# Patient Record
Sex: Male | Born: 1980 | Race: Black or African American | Hispanic: No | Marital: Single | Smoking: Current every day smoker
Health system: Southern US, Community
[De-identification: ages and names within clinical notes are randomized; demographics above are authoritative.]

## PROBLEM LIST (undated history)

## (undated) DIAGNOSIS — I10 Essential (primary) hypertension: Secondary | ICD-10-CM

## (undated) DIAGNOSIS — E7800 Pure hypercholesterolemia, unspecified: Secondary | ICD-10-CM

## (undated) DIAGNOSIS — G8929 Other chronic pain: Secondary | ICD-10-CM

## (undated) DIAGNOSIS — D693 Immune thrombocytopenic purpura: Secondary | ICD-10-CM

## (undated) DIAGNOSIS — M5380 Other specified dorsopathies, site unspecified: Secondary | ICD-10-CM

## (undated) HISTORY — PX: LEG SURGERY: SHX1003

## (undated) HISTORY — DX: Other chronic pain: G89.29

## (undated) HISTORY — PX: BACK SURGERY: SHX140

---
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
## 2000-03-28 ENCOUNTER — Emergency Department (HOSPITAL_COMMUNITY): Admission: EM | Admit: 2000-03-28 | Discharge: 2000-03-28 | Payer: Self-pay | Admitting: Emergency Medicine

## 2000-05-06 ENCOUNTER — Emergency Department (HOSPITAL_COMMUNITY): Admission: EM | Admit: 2000-05-06 | Discharge: 2000-05-06 | Payer: Self-pay | Admitting: Emergency Medicine

## 2001-10-23 ENCOUNTER — Emergency Department (HOSPITAL_COMMUNITY): Admission: EM | Admit: 2001-10-23 | Discharge: 2001-10-24 | Payer: Self-pay | Admitting: Emergency Medicine

## 2007-01-25 ENCOUNTER — Emergency Department (HOSPITAL_COMMUNITY): Admission: EM | Admit: 2007-01-25 | Discharge: 2007-01-26 | Payer: Self-pay | Admitting: Emergency Medicine

## 2007-03-08 ENCOUNTER — Emergency Department (HOSPITAL_COMMUNITY): Admission: EM | Admit: 2007-03-08 | Discharge: 2007-03-08 | Payer: Self-pay | Admitting: Emergency Medicine

## 2007-12-22 ENCOUNTER — Emergency Department (HOSPITAL_COMMUNITY): Admission: EM | Admit: 2007-12-22 | Discharge: 2007-12-22 | Payer: Self-pay | Admitting: Emergency Medicine

## 2008-10-22 ENCOUNTER — Emergency Department (HOSPITAL_COMMUNITY): Admission: EM | Admit: 2008-10-22 | Discharge: 2008-10-22 | Payer: Self-pay | Admitting: Family Medicine

## 2010-12-15 ENCOUNTER — Emergency Department (HOSPITAL_COMMUNITY)
Admission: EM | Admit: 2010-12-15 | Discharge: 2010-12-15 | Payer: Self-pay | Source: Home / Self Care | Admitting: Emergency Medicine

## 2011-01-09 ENCOUNTER — Emergency Department (HOSPITAL_COMMUNITY)
Admission: EM | Admit: 2011-01-09 | Discharge: 2011-01-09 | Payer: Self-pay | Source: Home / Self Care | Admitting: Family Medicine

## 2011-01-13 LAB — GLUCOSE, CAPILLARY: Glucose-Capillary: 98 mg/dL (ref 70–99)

## 2011-01-17 ENCOUNTER — Emergency Department (HOSPITAL_COMMUNITY)
Admission: EM | Admit: 2011-01-17 | Discharge: 2011-01-17 | Payer: Self-pay | Source: Home / Self Care | Admitting: Emergency Medicine

## 2012-01-19 ENCOUNTER — Emergency Department (INDEPENDENT_AMBULATORY_CARE_PROVIDER_SITE_OTHER)
Admission: EM | Admit: 2012-01-19 | Discharge: 2012-01-19 | Disposition: A | Discharge status: Home / Self Care | Payer: Self-pay | Source: Home / Self Care | Attending: Emergency Medicine | Admitting: Emergency Medicine

## 2012-01-19 ENCOUNTER — Encounter (HOSPITAL_COMMUNITY): Payer: Self-pay

## 2012-01-19 DIAGNOSIS — S39012A Strain of muscle, fascia and tendon of lower back, initial encounter: Secondary | ICD-10-CM

## 2012-01-19 DIAGNOSIS — S335XXA Sprain of ligaments of lumbar spine, initial encounter: Secondary | ICD-10-CM

## 2012-01-19 DIAGNOSIS — M549 Dorsalgia, unspecified: Secondary | ICD-10-CM

## 2012-01-19 HISTORY — DX: Other specified dorsopathies, site unspecified: M53.80

## 2012-01-19 MED ORDER — MELOXICAM 7.5 MG PO TABS: 7.5000 mg | ORAL_TABLET | Freq: Every day | ORAL | Status: DC

## 2012-01-19 MED ORDER — CYCLOBENZAPRINE HCL 10 MG PO TABS: 10.0000 mg | ORAL_TABLET | Freq: Two times a day (BID) | ORAL | Status: AC | PRN
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
## 2013-01-27 ENCOUNTER — Emergency Department (HOSPITAL_COMMUNITY)
Admission: EM | Admit: 2013-01-27 | Discharge: 2013-01-27 | Disposition: A | Discharge status: Home / Self Care | Payer: Self-pay | Attending: Emergency Medicine | Admitting: Emergency Medicine

## 2013-01-27 ENCOUNTER — Encounter (HOSPITAL_COMMUNITY): Payer: Self-pay | Admitting: Emergency Medicine

## 2013-01-27 ENCOUNTER — Emergency Department (HOSPITAL_COMMUNITY): Payer: Self-pay

## 2013-01-27 DIAGNOSIS — F17200 Nicotine dependence, unspecified, uncomplicated: Secondary | ICD-10-CM | POA: Insufficient documentation

## 2013-01-27 DIAGNOSIS — Y9389 Activity, other specified: Secondary | ICD-10-CM | POA: Insufficient documentation

## 2013-01-27 DIAGNOSIS — IMO0002 Reserved for concepts with insufficient information to code with codable children: Secondary | ICD-10-CM | POA: Insufficient documentation

## 2013-01-27 DIAGNOSIS — X500XXA Overexertion from strenuous movement or load, initial encounter: Secondary | ICD-10-CM | POA: Insufficient documentation

## 2013-01-27 DIAGNOSIS — M549 Dorsalgia, unspecified: Secondary | ICD-10-CM

## 2013-01-27 DIAGNOSIS — Z9889 Other specified postprocedural states: Secondary | ICD-10-CM | POA: Insufficient documentation

## 2013-01-27 DIAGNOSIS — Y9269 Other specified industrial and construction area as the place of occurrence of the external cause: Secondary | ICD-10-CM | POA: Insufficient documentation

## 2013-01-27 DIAGNOSIS — Y990 Civilian activity done for income or pay: Secondary | ICD-10-CM | POA: Insufficient documentation

## 2013-01-27 MED ORDER — OXYCODONE-ACETAMINOPHEN 5-325 MG PO TABS
1.0000 | ORAL_TABLET | Freq: Once | ORAL | Status: AC
  Administered 2013-01-27: 1 via ORAL
  Filled 2013-01-27: qty 1

## 2013-01-27 MED ORDER — DIAZEPAM 5 MG PO TABS
5.0000 mg | ORAL_TABLET | Freq: Once | ORAL | Status: AC
  Administered 2013-01-27: 5 mg via ORAL
  Filled 2013-01-27: qty 1

## 2013-01-27 MED ORDER — KETOROLAC TROMETHAMINE 60 MG/2ML IM SOLN
60.0000 mg | Freq: Once | INTRAMUSCULAR | Status: AC
  Administered 2013-01-27: 60 mg via INTRAMUSCULAR
  Filled 2013-01-27: qty 2

## 2013-01-27 MED ORDER — HYDROCODONE-ACETAMINOPHEN 5-500 MG PO TABS: 1.0000 | ORAL_TABLET | Freq: Four times a day (QID) | ORAL | Status: DC | PRN

## 2013-01-27 MED ORDER — CYCLOBENZAPRINE HCL 10 MG PO TABS: 10.0000 mg | ORAL_TABLET | Freq: Two times a day (BID) | ORAL | Status: DC | PRN

## 2013-01-27 MED ORDER — IBUPROFEN 600 MG PO TABS: 600.0000 mg | ORAL_TABLET | Freq: Four times a day (QID) | ORAL | Status: DC | PRN

## 2013-01-27 NOTE — ED Notes (Signed)
Pt c/o back pain since lifting this am. Has had recent back surgery per Dr. Danielle Dess (2012) for "2 slipped discs". Has had intermittent pain since then and numbness in left foot. Pain radiates from lower back to groin and inner thighs.

## 2013-01-27 NOTE — ED Notes (Signed)
Returned from xray

## 2013-01-27 NOTE — ED Provider Notes (Signed)
History     CSN: [id number]  Arrival date & time 01/27/13  1026   First MD Initiated Contact with Patient 01/27/13 1027      No chief complaint on file.   (Consider location/radiation/quality/duration/timing/severity/associated sxs/prior treatment) HPI Jeremiah Curtis is a 32 y.o. male who presents with complaint of lower back pain. Stats hx of surgery a year ago by Dr. Danielle Dess? States had two bad disks. Reports intermittent pain and left foot numbness since then. Has not followed up with Dr. Danielle Dess in the last year. States this morning was at work, lifted a 25lb box, when felt increased pain in the lower back. States pain shooting down left leg. Reports some numbness sensation in left foot. No weakness. No loss of bowels. No urinary incontinence or retention. No fever.   Past Medical History  Diagnosis Date  . Back symptoms, other     Past Surgical History  Procedure Date  . Back surgery     No family history on file.  History  Substance Use Topics  . Smoking status: Current Every Day Smoker  . Smokeless tobacco: Not on file  . Alcohol Use: Yes      Review of Systems  Constitutional: Negative for fever and chills.  Respiratory: Negative.   Cardiovascular: Negative.   Gastrointestinal: Negative for abdominal pain.  Genitourinary: Negative for dysuria, flank pain and difficulty urinating.  Musculoskeletal: Positive for back pain.    Allergies  Review of patient's allergies indicates no known allergies.  Home Medications   Current Outpatient Rx  Name  Route  Sig  Dispense  Refill  . MELOXICAM 7.5 MG PO TABS   Oral   Take 1 tablet (7.5 mg total) by mouth daily.   14 tablet   0     BP 155/93  Pulse 69  Temp 97.7 F (36.5 C) (Oral)  Resp 18  SpO2 100%  Physical Exam  Nursing note and vitals reviewed. Constitutional: He appears well-developed and well-nourished. No distress.  Neck: Neck supple.  Cardiovascular: Normal rate, regular rhythm and normal  heart sounds.   Pulmonary/Chest: Effort normal and breath sounds normal. No respiratory distress. He has no wheezes. He has no rales.  Musculoskeletal:       Midline lumbar spine tenderness. Paravertebral tenderness. Pain with left straight leg raise  Neurological:       Decreased sensation to dorsum of left foot. 5/5 and equal strength with bilateral foot dorsiflexion and plantarflexion. Normal sensation over bilateral thighs and perineum  Skin: Skin is warm and dry.    ED Course  Procedures (including critical care time)  Pt with back pain after lifting a 25lb box today. Hx of back surgery a year ago. No new numbness or weakness. Will get x-ray.  Dg Lumbar Spine Complete  01/27/2013  *RADIOLOGY REPORT*  Clinical Data: Pain post trauma  LUMBAR SPINE - COMPLETE 4+ VIEW  Comparison: None.  Findings:  Frontal, lateral, spot lumbosacral lateral, and bilateral oblique views were obtained. There are five non-rib bearing lumbar type vertebral bodies.  There is no fracture or spondylolisthesis.  There is mild disc space narrowing at L4-5. The other disc spaces appear normal.  There is no appreciable facet arthropathy.  IMPRESSION: Mild narrowing L4-5.  No fracture or spondylolisthesis.   Original Report Authenticated By: Bretta Bang, M.D.       1. Back pain       MDM  Pt with lower back pain radiating into left leg after lifting a box. States  pain has been like this before, no new radiating pain, no new numbness or weakness. No red flags suggesting cauda equina. No emergent indication for MRI. Pt ambulatory. Will treat at home with ibuprofen, vicodin, flexeril. Follow up with his surgeon.   Filed Vitals:   01/27/13 1040  BP: 155/93  Pulse: 69  Temp: 97.7 F (36.5 C)  Resp: 18           Keyron Pokorski A Akshath Mccarey, PA 01/27/13 1214

## 2013-01-27 NOTE — ED Notes (Signed)
Patient transported to X-ray 

## 2013-01-27 NOTE — ED Provider Notes (Signed)
Medical screening examination/treatment/procedure(s) were performed by non-physician practitioner and as supervising physician I was immediately available for consultation/collaboration.   Loren Racer, MD 01/27/13 1556

## 2013-02-02 ENCOUNTER — Encounter (HOSPITAL_COMMUNITY): Payer: Self-pay

## 2013-02-02 ENCOUNTER — Emergency Department (HOSPITAL_COMMUNITY)
Admission: EM | Admit: 2013-02-02 | Discharge: 2013-02-02 | Disposition: A | Discharge status: Home / Self Care | Payer: Self-pay | Attending: Emergency Medicine | Admitting: Emergency Medicine

## 2013-02-02 DIAGNOSIS — F17200 Nicotine dependence, unspecified, uncomplicated: Secondary | ICD-10-CM | POA: Insufficient documentation

## 2013-02-02 DIAGNOSIS — Z9889 Other specified postprocedural states: Secondary | ICD-10-CM | POA: Insufficient documentation

## 2013-02-02 DIAGNOSIS — M549 Dorsalgia, unspecified: Secondary | ICD-10-CM

## 2013-02-02 DIAGNOSIS — Z79899 Other long term (current) drug therapy: Secondary | ICD-10-CM | POA: Insufficient documentation

## 2013-02-02 DIAGNOSIS — M545 Low back pain, unspecified: Secondary | ICD-10-CM | POA: Insufficient documentation

## 2013-02-02 MED ORDER — OXYCODONE-ACETAMINOPHEN 5-325 MG PO TABS: 2.0000 | ORAL_TABLET | ORAL | Status: DC | PRN

## 2013-02-02 MED ORDER — METHOCARBAMOL 500 MG PO TABS: 500.0000 mg | ORAL_TABLET | Freq: Two times a day (BID) | ORAL | Status: DC | PRN

## 2013-02-02 MED ORDER — KETOROLAC TROMETHAMINE 60 MG/2ML IM SOLN
60.0000 mg | Freq: Once | INTRAMUSCULAR | Status: AC
  Administered 2013-02-02: 60 mg via INTRAMUSCULAR
  Filled 2013-02-02: qty 2

## 2013-02-02 MED ORDER — DIAZEPAM 5 MG PO TABS
5.0000 mg | ORAL_TABLET | Freq: Once | ORAL | Status: AC
  Administered 2013-02-02: 5 mg via ORAL
  Filled 2013-02-02: qty 1

## 2013-02-02 NOTE — ED Provider Notes (Signed)
History     CSN: [id number]  Arrival date & time 02/02/13  4098   First MD Initiated Contact with Patient 02/02/13 [phone]      Chief Complaint  Patient presents with  . Back Pain    (Consider location/radiation/quality/duration/timing/severity/associated sxs/prior treatment) HPI Comments: Patient is a 32 year old male who presents with gradual onset of lower back pain that started 1 week ago. The pain is sharp and severe and radiates into his bilateral legs. The pain is constant. Movement and defecation makes the pain worse. Nothing makes the pain better. Patient has tried flexeril and vicodin for pain without relief. No associated symptoms. No saddles paresthesias or bladder/bowel incontinence.      Past Medical History  Diagnosis Date  . Back symptoms, other     Past Surgical History  Procedure Date  . Back surgery     No family history on file.  History  Substance Use Topics  . Smoking status: Current Every Day Smoker  . Smokeless tobacco: Not on file  . Alcohol Use: Yes      Review of Systems  Musculoskeletal: Positive for back pain.  All other systems reviewed and are negative.    Allergies  Fish allergy  Home Medications   Current Outpatient Rx  Name  Route  Sig  Dispense  Refill  . CYCLOBENZAPRINE HCL 10 MG PO TABS   Oral   Take 1 tablet (10 mg total) by mouth 2 (two) times daily as needed for muscle spasms.   20 tablet   0   . HYDROCODONE-ACETAMINOPHEN 5-500 MG PO TABS   Oral   Take 1-2 tablets by mouth every 6 (six) hours as needed for pain.   15 tablet   0   . IBUPROFEN 600 MG PO TABS   Oral   Take 1 tablet (600 mg total) by mouth every 6 (six) hours as needed for pain.   30 tablet   0     BP 167/101  Pulse 87  Temp 98.4 F (36.9 C) (Oral)  Resp 20  Ht 5\' 5"  (1.651 m)  Wt 160 lb (72.576 kg)  BMI 26.63 kg/m2  SpO2 100%  Physical Exam  Nursing note and vitals reviewed. Constitutional: He is oriented to person, place, and time.  He appears well-developed and well-nourished. No distress.  HENT:  Head: Normocephalic and atraumatic.  Eyes: Conjunctivae normal and EOM are normal.  Neck: Normal range of motion. Neck supple.  Cardiovascular: Normal rate and regular rhythm.  Exam reveals no gallop and no friction rub.   No murmur heard. Pulmonary/Chest: Effort normal and breath sounds normal. He has no wheezes. He has no rales. He exhibits no tenderness.  Abdominal: Soft. He exhibits no distension. There is no tenderness. There is no rebound and no guarding.  Musculoskeletal: Normal range of motion.       No midline tenderness to palpation.   Neurological: He is alert and oriented to person, place, and time. Coordination normal.       Strength and sensation equal and intact bilaterally. Speech is goal-oriented. Moves limbs without ataxia.   Skin: Skin is warm and dry.  Psychiatric: He has a normal mood and affect. His behavior is normal.    ED Course  Procedures (including critical care time)  Labs Reviewed - No data to display No results found.   1. Back pain       MDM  9:19 AM Patient will have toradol and valium for pain. Xray done  when he was here last week. No new injury so repeat xray not indicated. No neuro deficits. No bladder/bowel incontinence, saddle paresthesias. No fever/weight loss. Patient will be instructed to follow up with Dr. Danielle Dess for further evaluation and management. Patient instructed to return with worsening or concerning symptoms.         Emilia Beck, [place] 02/02/13 [phone]

## 2013-02-02 NOTE — ED Notes (Signed)
Lower back pain began to change on Sunday was seen here last Thursday for the same .  The new symptoms are numbness to the lt. Leg and spasm in the right, Denies any problems with voiding severe pain when moving his bowels.

## 2013-02-02 NOTE — ED Provider Notes (Signed)
Medical screening examination/treatment/procedure(s) were performed by non-physician practitioner and as supervising physician I was immediately available for consultation/collaboration.  Evanne Matsunaga, MD 02/02/13 2315 

## 2013-06-04 ENCOUNTER — Encounter (HOSPITAL_COMMUNITY): Payer: Self-pay | Admitting: Emergency Medicine

## 2013-06-04 ENCOUNTER — Emergency Department (HOSPITAL_COMMUNITY)
Admission: EM | Admit: 2013-06-04 | Discharge: 2013-06-04 | Disposition: A | Discharge status: Home / Self Care | Payer: Self-pay | Attending: Emergency Medicine | Admitting: Emergency Medicine

## 2013-06-04 DIAGNOSIS — M62830 Muscle spasm of back: Secondary | ICD-10-CM

## 2013-06-04 DIAGNOSIS — F17200 Nicotine dependence, unspecified, uncomplicated: Secondary | ICD-10-CM | POA: Insufficient documentation

## 2013-06-04 DIAGNOSIS — M62838 Other muscle spasm: Secondary | ICD-10-CM | POA: Insufficient documentation

## 2013-06-04 MED ORDER — CYCLOBENZAPRINE HCL 10 MG PO TABS: 10.0000 mg | ORAL_TABLET | Freq: Two times a day (BID) | ORAL | Status: DC | PRN

## 2013-06-04 MED ORDER — CYCLOBENZAPRINE HCL 10 MG PO TABS
10.0000 mg | ORAL_TABLET | Freq: Once | ORAL | Status: AC
  Administered 2013-06-04: 10 mg via ORAL
  Filled 2013-06-04: qty 1

## 2013-06-04 NOTE — ED Notes (Addendum)
Hx of 2 ruptured discs in 2011; surgical repair; numbness in left leg since. Today presents with lower back pressure worsening by working around house since Thursday; rest and Ramond Dial helps it. Patient ambulates with steady gait; no distress with movement at this time.

## 2013-06-04 NOTE — ED Notes (Signed)
PT ambulated with baseline gait; VSS; A&Ox3; no signs of distress; respirations even and unlabored; skin warm and dry; no questions upon discharge.  

## 2013-06-04 NOTE — ED Provider Notes (Signed)
History     CSN: [id number]  Arrival date & time 06/04/13  0840   First MD Initiated Contact with Patient 06/04/13 [phone]      Chief Complaint  Patient presents with  . Back Pain    (Consider location/radiation/quality/duration/timing/severity/associated sxs/prior treatment) Patient is a 32 y.o. male presenting with back pain. The history is provided by the patient.  Back Pain Location:  Lumbar spine Quality:  Aching and stiffness Stiffness is present:  Unable to specify Radiates to:  Does not radiate Pain severity:  Mild Pain is:  Same all the time Onset quality:  Sudden Duration:  2 days Timing:  Constant Progression:  Unchanged Chronicity:  New Context: lifting heavy objects   Relieved by:  Nothing Worsened by:  Nothing tried Associated symptoms: no abdominal pain, no dysuria and no fever     Past Medical History  Diagnosis Date  . Back symptoms, other     Past Surgical History  Procedure Laterality Date  . Back surgery      History reviewed. No pertinent family history.  History  Substance Use Topics  . Smoking status: Current Every Day Smoker  . Smokeless tobacco: Not on file  . Alcohol Use: Yes      Review of Systems  Constitutional: Negative for fever and fatigue.  Gastrointestinal: Negative for nausea, vomiting and abdominal pain.  Genitourinary: Negative for dysuria and difficulty urinating.  Musculoskeletal: Positive for back pain.  All other systems reviewed and are negative.    Allergies  Fish allergy  Home Medications  No current outpatient prescriptions on file.  BP 148/90  Pulse 76  Temp(Src) 98.3 F (36.8 C) (Oral)  Resp 16  Ht 5\' 5"  (1.651 m)  Wt 180 lb (81.647 kg)  BMI 29.95 kg/m2  SpO2 100%  Physical Exam  Constitutional: He is oriented to person, place, and time. He appears well-developed and well-nourished. No distress.  HENT:  Head: Normocephalic and atraumatic.  Mouth/Throat: No oropharyngeal exudate.  Eyes: EOM  are normal. Pupils are equal, round, and reactive to light.  Neck: Normal range of motion. Neck supple.  Cardiovascular: Normal rate and regular rhythm.  Exam reveals no friction rub.   No murmur heard. Pulmonary/Chest: Effort normal and breath sounds normal. No respiratory distress. He has no wheezes. He has no rales.  Abdominal: He exhibits no distension. There is no tenderness. There is no rebound.  Musculoskeletal: He exhibits no edema.       Lumbar back: He exhibits decreased range of motion, tenderness (mild, left lower back) and spasm. He exhibits no bony tenderness, no swelling, no edema, no deformity and no laceration.       Back:  Neurological: He is alert and oriented to person, place, and time.  Skin: He is not diaphoretic.    ED Course  Procedures (including critical care time)  Labs Reviewed - No data to display No results found.   1. Back muscle spasm       MDM    32 year old male presents with L lower back pain after lifting boxes 2 days ago. States he feels spasm in his back. Denies weakness/numbness, incontinence, urinary/fecal retention. No cancer hx, no hx of IVDA. AFVSS here. Mild L lower back spasm, no midline tenderness. Normal strength and sensation, reflexes 1+. Will give flexeril and f/u with PCP.         Dagmar Hait, MD 06/04/13 1550

## 2013-06-04 NOTE — ED Notes (Signed)
Pt presents to ED today with complaints of low back pressure across both sides . Pt states he had a steroid shot about 2 months ago and he thinks the back pain is coming from injection site.

## 2013-06-05 NOTE — ED Provider Notes (Signed)
I saw and evaluated the patient, reviewed the resident's note and I agree with the findings and plan.   Loren Racer, MD 06/05/13 1500

## 2013-11-03 ENCOUNTER — Encounter (HOSPITAL_COMMUNITY): Payer: Self-pay | Admitting: Emergency Medicine

## 2013-11-03 ENCOUNTER — Emergency Department (HOSPITAL_COMMUNITY)
Admission: EM | Admit: 2013-11-03 | Discharge: 2013-11-03 | Disposition: A | Discharge status: Home / Self Care | Payer: Self-pay | Attending: Emergency Medicine | Admitting: Emergency Medicine

## 2013-11-03 DIAGNOSIS — R209 Unspecified disturbances of skin sensation: Secondary | ICD-10-CM | POA: Insufficient documentation

## 2013-11-03 DIAGNOSIS — R599 Enlarged lymph nodes, unspecified: Secondary | ICD-10-CM | POA: Insufficient documentation

## 2013-11-03 DIAGNOSIS — H9209 Otalgia, unspecified ear: Secondary | ICD-10-CM | POA: Insufficient documentation

## 2013-11-03 DIAGNOSIS — R202 Paresthesia of skin: Secondary | ICD-10-CM

## 2013-11-03 DIAGNOSIS — R590 Localized enlarged lymph nodes: Secondary | ICD-10-CM

## 2013-11-03 DIAGNOSIS — F17200 Nicotine dependence, unspecified, uncomplicated: Secondary | ICD-10-CM | POA: Insufficient documentation

## 2013-11-03 MED ORDER — IBUPROFEN 800 MG PO TABS: 800.0000 mg | ORAL_TABLET | Freq: Three times a day (TID) | ORAL | Status: DC

## 2013-11-03 NOTE — ED Notes (Signed)
The pt has pain in the back of his head since yesterday and tonight he noticed a lump behind his lt ear with numbness.  Moves all extremities no distress.

## 2013-11-03 NOTE — ED Notes (Signed)
Pt in c/o pain to the back of his neck, denies injury, states the pain, also pain to left ear, pain started yesterday

## 2013-11-03 NOTE — ED Notes (Signed)
No answer in waitiing room

## 2013-11-03 NOTE — ED Provider Notes (Signed)
CSN: [id number]     Arrival date & time 11/03/13  1927 History  This chart was scribed for non-physician practitioner Dierdre Forth, PA-C working with Shon Baton, MD by Clydene Laming, ED Scribe. This patient was seen in room TR10C/TR10C and the patient's care was started at 9:45 PM.   Chief Complaint  Patient presents with  . Neck Pain  . Otalgia    The history is provided by the patient and medical records. No language interpreter was used.   HPI Comments: Jeremiah Curtis is a 32 y.o. male who presents to the Emergency Department complaining of lumps on his neck onset yesterday with paresthesia of the scalp onset today. Pt reports he thinks that he may have a cold as his nose has been running and he has felt tired. Pt denies h/a, vision changes, fevers and chills, neck pain, stiffness, chest pain, cough, shortness of breath, abdominal pain, nausea, vomiting, diarrhea, weakness, dizziness, syncope for dysuria, hematuria, weakness, numbness.  He is not having trouble walking or talking. Pt denies having a scalp infection or any additional abnormalities. He states he has not had these symptoms previously. Pt has a hx of back surgery 3 years ago. Pt has not attempted any treatments for relief.  Nothing makes it better or worse.  Pt states he had feel the pressure of palpation, but he cannot feel pain when he pulls his hair.    Past Medical History  Diagnosis Date  . Back symptoms, other    Past Surgical History  Procedure Laterality Date  . Back surgery     History reviewed. No pertinent family history. History  Substance Use Topics  . Smoking status: Current Every Day Smoker  . Smokeless tobacco: Not on file  . Alcohol Use: Yes    Review of Systems  Constitutional: Negative for fever, chills, diaphoresis, appetite change, fatigue and unexpected weight change.  HENT: Negative for ear pain and mouth sores.   Eyes: Negative for visual disturbance.  Respiratory: Negative for  cough, chest tightness, shortness of breath and wheezing.   Cardiovascular: Negative for chest pain.  Gastrointestinal: Negative for nausea, vomiting, abdominal pain, diarrhea and constipation.  Endocrine: Negative for polydipsia, polyphagia and polyuria.  Genitourinary: Negative for dysuria, urgency, frequency and hematuria.  Musculoskeletal: Negative for back pain, neck pain and neck stiffness.  Skin: Negative for rash.  Allergic/Immunologic: Negative for immunocompromised state.  Neurological: Positive for numbness. Negative for syncope, light-headedness and headaches.  Hematological: Positive for adenopathy. Does not bruise/bleed easily.  Psychiatric/Behavioral: Negative for sleep disturbance. The patient is not nervous/anxious.     Allergies  Fish allergy  Home Medications   Current Outpatient Rx  Name  Route  Sig  Dispense  Refill  . ibuprofen (ADVIL,MOTRIN) 800 MG tablet   Oral   Take 1 tablet (800 mg total) by mouth 3 (three) times daily.   21 tablet   0    Triage Vitals:BP 146/96  Pulse 91  Temp(Src) 97.7 F (36.5 C) (Oral)  Resp 18  Wt 173 lb 12.8 oz (78.835 kg)  SpO2 98% Physical Exam  Nursing note and vitals reviewed. Constitutional: He is oriented to person, place, and time. He appears well-developed and well-nourished. No distress.  HENT:  Head: Normocephalic and atraumatic.    Right Ear: Hearing, tympanic membrane, external ear and ear canal normal.  Left Ear: Hearing, tympanic membrane, external ear and ear canal normal.  Nose: Nose normal. No mucosal edema.  Mouth/Throat: Uvula is midline, oropharynx is clear  and moist and mucous membranes are normal. No oropharyngeal exudate, posterior oropharyngeal edema, posterior oropharyngeal erythema or tonsillar abscesses.  Eyes: Conjunctivae and EOM are normal. Pupils are equal, round, and reactive to light. No scleral icterus.  Neck: Normal range of motion and full passive range of motion without pain. Neck  supple. No spinous process tenderness and no muscular tenderness present. No rigidity. Normal range of motion present. No Brudzinski's sign and no Kernig's sign noted.  Full range of motion without pain No nuchal rigidity No meningeal signs  Cardiovascular: Normal rate, regular rhythm, normal heart sounds and intact distal pulses.   No murmur heard. Pulmonary/Chest: Effort normal and breath sounds normal. No respiratory distress. He has no wheezes. He has no rales.  Abdominal: Soft. Bowel sounds are normal. He exhibits no distension. There is no tenderness. There is no rebound and no guarding.  Musculoskeletal: Normal range of motion.  Lymphadenopathy:       Head (right side): Occipital adenopathy present. No submental, no submandibular, no tonsillar, no preauricular and no posterior auricular adenopathy present.       Head (left side): Occipital adenopathy present. No submental, no submandibular, no tonsillar, no preauricular and no posterior auricular adenopathy present.    He has no cervical adenopathy.       Right cervical: No superficial cervical, no deep cervical and no posterior cervical adenopathy present.      Left cervical: No superficial cervical, no deep cervical and no posterior cervical adenopathy present.    He has no axillary adenopathy.       Right axillary: No pectoral and no lateral adenopathy present.       Left axillary: No pectoral and no lateral adenopathy present.      Right: No supraclavicular adenopathy present.       Left: No supraclavicular adenopathy present.  Neurological: He is alert and oriented to person, place, and time. He has normal reflexes. No cranial nerve deficit. He exhibits normal muscle tone. Coordination normal. GCS eye subscore is 4. GCS verbal subscore is 5. GCS motor subscore is 6.  Reflex Scores:      Tricep reflexes are 2+ on the right side and 2+ on the left side.      Bicep reflexes are 2+ on the right side and 2+ on the left side.       Brachioradialis reflexes are 2+ on the right side and 2+ on the left side.      Patellar reflexes are 2+ on the right side and 2+ on the left side.      Achilles reflexes are 2+ on the right side and 2+ on the left side. Speech is clear and goal oriented, follows commands Cranial nerves III - XII without deficit, no facial droop Normal strength in upper and lower extremities bilaterally, strong and equal grip strength Sensation normal to light and sharp touch in the bilateral upper and lower extremities - patient reports decreased sensation to approximately quarter sized area just above the left ear Moves extremities without ataxia, coordination intact Normal finger to nose and rapid alternating movements Neg romberg, no pronator drift Normal gait Normal heel-shin and balance   Skin: Skin is warm and dry. No rash noted. He is not diaphoretic. No erythema.  Psychiatric: He has a normal mood and affect. His behavior is normal. Judgment and thought content normal.    ED Course  Procedures (including critical care time) DIAGNOSTIC STUDIES: Oxygen Saturation is 100% on RA, normal by my interpretation.  COORDINATION OF CARE: 09:50 PM- Discussed treatment plan with pt at bedside. Pt verbalized understanding and agreement with plan.   Labs Review Labs Reviewed - No data to display Imaging Review No results found.  EKG Interpretation   None       MDM   1. Paresthesia   2. Lymphadenopathy, occipital      Shannon Kirkendall presents with lymphadenopathy and approximately quarter-sized area just above the left ear with decreased sensation in the C2 dermatome however paresthesia does not extend throughout the entire C2 dermatome and is present only on the left. Patient is otherwise neurovascularly intact with otherwise normal neurologic exam.  Mild occipital lymphadenopathy but no other lymphadenopathy noted on patient exam.  No changes to the skin or hair of the scalp. No evidence of  otitis media or otitis externa on exam. No evidence of cellulitis.  Patient to be discharged with anti-inflammatories and followup with primary care.  Is initially hypertensive when he arrived however blood pressure has decreased spontaneously.  He is alert, oriented, nontoxic, nonseptic appearing. He is afebrile and not tachycardic.  He has no paraspinal or midline neck tenderness and no nuchal rigidity.  It has been determined that no acute conditions requiring further emergency intervention are present at this time. The patient/guardian have been advised of the diagnosis and plan. We have discussed signs and symptoms that warrant return to the ED, such as changes or worsening in symptoms.   Vital signs are stable at discharge.   BP 146/96  Pulse 91  Temp(Src) 97.7 F (36.5 C) (Oral)  Resp 18  Wt 173 lb 12.8 oz (78.835 kg)  SpO2 98%  Patient/guardian has voiced understanding and agreed to follow-up with the PCP or specialist.   I personally performed the services described in this documentation, which was scribed in my presence. The recorded information has been reviewed and is accurate.     Dahlia Client Lutie Pickler, PA-C 11/03/13 2321

## 2013-11-04 NOTE — ED Provider Notes (Signed)
Medical screening examination/treatment/procedure(s) were performed by non-physician practitioner and as supervising physician I was immediately available for consultation/collaboration.  EKG Interpretation   None        Graziella Connery F Frimet Durfee, MD 11/04/13 1438 

## 2014-01-24 ENCOUNTER — Encounter (HOSPITAL_COMMUNITY): Payer: Self-pay | Admitting: Emergency Medicine

## 2014-01-24 ENCOUNTER — Emergency Department (HOSPITAL_COMMUNITY)
Admission: EM | Admit: 2014-01-24 | Discharge: 2014-01-24 | Disposition: A | Discharge status: Home / Self Care | Payer: Self-pay | Attending: Emergency Medicine | Admitting: Emergency Medicine

## 2014-01-24 DIAGNOSIS — M545 Low back pain, unspecified: Secondary | ICD-10-CM | POA: Insufficient documentation

## 2014-01-24 DIAGNOSIS — M549 Dorsalgia, unspecified: Secondary | ICD-10-CM

## 2014-01-24 DIAGNOSIS — Z791 Long term (current) use of non-steroidal anti-inflammatories (NSAID): Secondary | ICD-10-CM | POA: Insufficient documentation

## 2014-01-24 DIAGNOSIS — Z9889 Other specified postprocedural states: Secondary | ICD-10-CM | POA: Insufficient documentation

## 2014-01-24 DIAGNOSIS — F17200 Nicotine dependence, unspecified, uncomplicated: Secondary | ICD-10-CM | POA: Insufficient documentation

## 2014-01-24 HISTORY — DX: Essential (primary) hypertension: I10

## 2014-01-24 MED ORDER — KETOROLAC TROMETHAMINE 60 MG/2ML IM SOLN
60.0000 mg | Freq: Once | INTRAMUSCULAR | Status: AC
  Administered 2014-01-24: 60 mg via INTRAMUSCULAR
  Filled 2014-01-24: qty 2

## 2014-01-24 MED ORDER — NAPROXEN 500 MG PO TABS: 500.0000 mg | ORAL_TABLET | Freq: Two times a day (BID) | ORAL | Status: DC

## 2014-01-24 MED ORDER — CYCLOBENZAPRINE HCL 10 MG PO TABS: 10.0000 mg | ORAL_TABLET | Freq: Two times a day (BID) | ORAL | Status: DC | PRN

## 2014-01-24 MED ORDER — CYCLOBENZAPRINE HCL 10 MG PO TABS
10.0000 mg | ORAL_TABLET | Freq: Once | ORAL | Status: AC
Start: 2014-01-24 — End: 2014-01-24
  Administered 2014-01-24: 10 mg via ORAL
  Filled 2014-01-24: qty 1

## 2014-01-24 NOTE — ED Notes (Signed)
Szekalski, PA at bedside for evaluation.  

## 2014-01-24 NOTE — ED Provider Notes (Signed)
CSN: [id number]     Arrival date & time 01/24/14  0911 History   This chart was scribed for non-physician practitioner Emilia BeckKaitlyn Turkessa Ostrom, PA-C, working with Shon Batonourtney F Horton, MD, by Yevette EdwardsAngela Bracken, ED Scribe. This patient was seen in room TR08C/TR08C and the patient's care was started at 9:53 AM.  First MD Initiated Contact with Patient 01/24/14 0915     No chief complaint on file.  The history is provided by the patient. No language interpreter was used.    HPI Comments: Jeremiah Curtis is a 33 y.o. male, with a h/o back pain, who presents to the Emergency Department complaining of lower back pain which began three days ago while having he was having sex. The pt reports the pain migrates down his left leg. He has not treated the pain with any OTC medication. The pt denies a fall or injury. Mr. Jeremiah Curtis is a current smoker.   Past Medical History  Diagnosis Date  . Back symptoms, other    Past Surgical History  Procedure Laterality Date  . Back surgery     No family history on file. History  Substance Use Topics  . Smoking status: Current Every Day Smoker  . Smokeless tobacco: Not on file  . Alcohol Use: Yes    Review of Systems  Musculoskeletal: Positive for back pain.  All other systems reviewed and are negative.   Allergies  Fish allergy  Home Medications   Current Outpatient Rx  Name  Route  Sig  Dispense  Refill  . ibuprofen (ADVIL,MOTRIN) 800 MG tablet   Oral   Take 1 tablet (800 mg total) by mouth 3 (three) times daily.   21 tablet   0    Triage Vitals: BP 165/106  Pulse 88  Temp(Src) 99.4 F (37.4 C) (Oral)  Resp 18  Ht 5\' 5"  (1.651 m)  Wt 180 lb (81.647 kg)  BMI 29.95 kg/m2  SpO2 100%  Physical Exam  Nursing note and vitals reviewed. Constitutional: He is oriented to person, place, and time. He appears well-developed and well-nourished. No distress.  HENT:  Head: Normocephalic and atraumatic.  Eyes: EOM are normal.  Neck: Neck supple. No  tracheal deviation present.  Cardiovascular: Normal rate.   Pulmonary/Chest: Effort normal. No respiratory distress.  Musculoskeletal: Normal range of motion.  No midline spine tenderness to palpation. Left lumbar paraspinal tenderness to palpation.   Neurological: He is alert and oriented to person, place, and time.  Lower extremities strength and sensation equal and intact bilaterally.   Skin: Skin is warm and dry.  Psychiatric: He has a normal mood and affect. His behavior is normal.    ED Course  Procedures (including critical care time)  DIAGNOSTIC STUDIES: Oxygen Saturation is 100% on room air, normal by my interpretation.    COORDINATION OF CARE:  9:56 AM- Discussed treatment plan with patient, and the patient agreed to the plan.   Labs Review Labs Reviewed - No data to display Imaging Review No results found.  EKG Interpretation   None       MDM   1. Back pain    10:08 AM Patient likely has muscle pain. Patient has no bony tenderness to palpation. Patient will have Naprosyn and flexeril as needed for pain. Vitals stable and patient afebrile.   I personally performed the services described in this documentation, which was scribed in my presence. The recorded information has been reviewed and is accurate.   Emilia BeckKaitlyn Darnita Woodrum, PA-C 01/24/14 1008

## 2014-01-24 NOTE — Discharge Instructions (Signed)
Take Naprosyn as needed for pain. Take Flexeril as needed for muscle spasm. Refer to attached documents for more information.

## 2014-01-24 NOTE — ED Provider Notes (Signed)
Medical screening examination/treatment/procedure(s) were performed by non-physician practitioner and as supervising physician I was immediately available for consultation/collaboration.  EKG Interpretation   None        Courtney F Horton, MD 01/24/14 1200 

## 2014-01-24 NOTE — ED Notes (Signed)
Patient states chronic back problems.  He states started hurting bad 3 x days ago this time.  Patient states did not take any medicine at home or try anything to help pain.

## 2014-01-31 ENCOUNTER — Encounter (HOSPITAL_COMMUNITY): Payer: Self-pay | Admitting: Emergency Medicine

## 2014-01-31 ENCOUNTER — Emergency Department (HOSPITAL_COMMUNITY)
Admission: EM | Admit: 2014-01-31 | Discharge: 2014-01-31 | Disposition: A | Discharge status: Home / Self Care | Payer: Self-pay | Attending: Emergency Medicine | Admitting: Emergency Medicine

## 2014-01-31 DIAGNOSIS — M5430 Sciatica, unspecified side: Secondary | ICD-10-CM | POA: Insufficient documentation

## 2014-01-31 DIAGNOSIS — F17200 Nicotine dependence, unspecified, uncomplicated: Secondary | ICD-10-CM | POA: Insufficient documentation

## 2014-01-31 DIAGNOSIS — M549 Dorsalgia, unspecified: Secondary | ICD-10-CM

## 2014-01-31 DIAGNOSIS — I10 Essential (primary) hypertension: Secondary | ICD-10-CM | POA: Insufficient documentation

## 2014-01-31 MED ORDER — CYCLOBENZAPRINE HCL 10 MG PO TABS: 10.0000 mg | ORAL_TABLET | Freq: Three times a day (TID) | ORAL | Status: DC | PRN

## 2014-01-31 MED ORDER — CYCLOBENZAPRINE HCL 10 MG PO TABS
10.0000 mg | ORAL_TABLET | Freq: Once | ORAL | Status: AC
  Administered 2014-01-31: 10 mg via ORAL
  Filled 2014-01-31: qty 1

## 2014-01-31 NOTE — ED Notes (Signed)
Pt reports left leg pain x 1 week, hx of same due to slipped disc and pinched nerve.

## 2014-01-31 NOTE — Discharge Instructions (Signed)
Please follow up with your primary care physician in 1-2 days. If you do not have one please call the [facility] number listed above. Please follow up with your back doctor to schedule a follow up appointment. Please take Flexeril and Motrin as prescribed. Please read all discharge instructions and return precautions.   Sciatica Sciatica is pain, weakness, numbness, or tingling along the path of the sciatic nerve. The nerve starts in the lower back and runs down the back of each leg. The nerve controls the muscles in the lower leg and in the back of the knee, while also providing sensation to the back of the thigh, lower leg, and the sole of your foot. Sciatica is a symptom of another medical condition. For instance, nerve damage or certain conditions, such as a herniated disk or bone spur on the spine, pinch or put pressure on the sciatic nerve. This causes the pain, weakness, or other sensations normally associated with sciatica. Generally, sciatica only affects one side of the body. CAUSES   Herniated or slipped disc.  Degenerative disk disease.  A pain disorder involving the narrow muscle in the buttocks (piriformis syndrome).  Pelvic injury or fracture.  Pregnancy.  Tumor (rare). SYMPTOMS  Symptoms can vary from mild to very severe. The symptoms usually travel from the low back to the buttocks and down the back of the leg. Symptoms can include:  Mild tingling or dull aches in the lower back, leg, or hip.  Numbness in the back of the calf or sole of the foot.  Burning sensations in the lower back, leg, or hip.  Sharp pains in the lower back, leg, or hip.  Leg weakness.  Severe back pain inhibiting movement. These symptoms may get worse with coughing, sneezing, laughing, or prolonged sitting or standing. Also, being overweight may worsen symptoms. DIAGNOSIS  Your caregiver will perform a physical exam to look for common symptoms of sciatica. He or she may ask  you to do certain movements or activities that would trigger sciatic nerve pain. Other tests may be performed to find the cause of the sciatica. These may include:  Blood tests.  X-rays.  Imaging tests, such as an MRI or CT scan. TREATMENT  Treatment is directed at the cause of the sciatic pain. Sometimes, treatment is not necessary and the pain and discomfort goes away on its own. If treatment is needed, your caregiver may suggest:  Over-the-counter medicines to relieve pain.  Prescription medicines, such as anti-inflammatory medicine, muscle relaxants, or narcotics.  Applying heat or ice to the painful area.  Steroid injections to lessen pain, irritation, and inflammation around the nerve.  Reducing activity during periods of pain.  Exercising and stretching to strengthen your abdomen and improve flexibility of your spine. Your caregiver may suggest losing weight if the extra weight makes the back pain worse.  Physical therapy.  Surgery to eliminate what is pressing or pinching the nerve, such as a bone spur or part of a herniated disk. HOME CARE INSTRUCTIONS   Only take over-the-counter or prescription medicines for pain or discomfort as directed by your caregiver.  Apply ice to the affected area for 20 minutes, 3 4 times a day for the first 48 72 hours. Then try heat in the same way.  Exercise, stretch, or perform your usual activities if these do not aggravate your pain.  Attend physical therapy sessions as directed by your caregiver.  Keep all follow-up appointments as directed by your caregiver.  Do not wear high heels or shoes that do not provide proper support.  Check your mattress to see if it is too soft. A firm mattress may lessen your pain and discomfort. SEEK IMMEDIATE MEDICAL CARE IF:   You lose control of your bowel or bladder (incontinence).  You have increasing weakness in the lower back, pelvis, buttocks, or legs.  You have redness or swelling of your  back.  You have a burning sensation when you urinate.  You have pain that gets worse when you lie down or awakens you at night.  Your pain is worse than you have experienced in the past.  Your pain is lasting longer than 4 weeks.  You are suddenly losing weight without reason. MAKE SURE YOU:  Understand these instructions.  Will watch your condition.  Will get help right away if you are not doing well or get worse. Document Released: 12/09/2001 Document Revised: 06/15/2012 Document Reviewed: 04/25/2012 [facility] Patient Information 2014 [place], [place].

## 2014-01-31 NOTE — ED Provider Notes (Signed)
CSN: [id number]     Arrival date & time 01/31/14  1431 History   First MD Initiated Contact with Patient 01/31/14 1447    This chart was scribed for Francee PiccoloJennifer Yatzari Jonsson PA-C, a non-physician practitioner working with No att. providers found by Lewanda RifeAlexandra Hurtado, ED Scribe. This patient was seen in room TR05C/TR05C and the patient's care was started at 2:56 PM      Chief Complaint  Patient presents with  . Leg Pain   (Consider location/radiation/quality/duration/timing/severity/associated sxs/prior Treatment) The history is provided by the patient. No language interpreter was used.   HPI Comments: Jeremiah Curtis is a 33 y.o. male who presents to the Emergency Department complaining of constant low back pain radiating down posterior left leg onset chronic, but exacerbated 1 week ago after picking up his child. Describes back pain as "poking pain" and moderate in severity. Reports back pain is exacerbated by flexion of back and walking. Reports pain is alleviated mildly by sitting. Denies trying any alleviating factors. Denies associated recent trauma, and recent fall. Denies urinary or fecal incontinence, urinary retention, perineal/saddle paresthesias, fever, PMHx of cancer, and IV drug use. Reports PMHx of similar symptoms, back surgery, slipped disc, and nerve impingement. Reports having a steroid injection last year with relief of symptoms until exacerbation 1 week ago. Denies PMHx DM.  Past Medical History  Diagnosis Date  . Back symptoms, other   . Hypertension    Past Surgical History  Procedure Laterality Date  . Back surgery     No family history on file. History  Substance Use Topics  . Smoking status: Current Every Day Smoker -- 0.50 packs/day    Types: Cigarettes  . Smokeless tobacco: Not on file  . Alcohol Use: Yes    Review of Systems  Constitutional: Negative for fever.  Musculoskeletal: Positive for back pain and myalgias.  Psychiatric/Behavioral: Negative for  confusion.  All other systems reviewed and are negative.    Allergies  Fish allergy  Home Medications   Current Outpatient Rx  Name  Route  Sig  Dispense  Refill  . cyclobenzaprine (FLEXERIL) 10 MG tablet   Oral   Take 1 tablet (10 mg total) by mouth 3 (three) times daily as needed for muscle spasms.   15 tablet   0    BP 162/90  Pulse 67  Temp(Src) 97.3 F (36.3 C) (Oral)  Resp 18  SpO2 100% Physical Exam  Constitutional: He is oriented to person, place, and time. He appears well-developed and well-nourished. No distress.  HENT:  Head: Normocephalic and atraumatic.  Right Ear: External ear normal.  Left Ear: External ear normal.  Nose: Nose normal.  Mouth/Throat: Oropharynx is clear and moist. No oropharyngeal exudate.  Eyes: Conjunctivae and EOM are normal. Pupils are equal, round, and reactive to light.  Neck: Normal range of motion. Neck supple.  Cardiovascular: Normal rate, regular rhythm, normal heart sounds and intact distal pulses.   Pulmonary/Chest: Effort normal and breath sounds normal. No respiratory distress.  Abdominal: Soft. There is no tenderness.  Musculoskeletal: He exhibits tenderness.       Back:  No midline C-spine, T-spine, or L-spine tenderness with no step-offs or deformities noted   TTP over area documented on graphical documentation    Neurological: He is alert and oriented to person, place, and time. He has normal strength. No cranial nerve deficit or sensory deficit. Gait normal. GCS eye subscore is 4. GCS verbal subscore is 5. GCS motor subscore is 6.  No pronator  drift. Bilateral heel-knee-shin intact. 5/5 strength in bilateral lower extremities. Ankle plantar and dorsiflexion intact. +2 DP and PT pulses. Antalgic gait. Sharp and dull sensation intact.     Skin: Skin is warm and dry. He is not diaphoretic.    ED Course  Procedures  COORDINATION OF CARE:  Nursing notes reviewed. Vital signs reviewed. Initial pt interview and  examination performed.   3:07 PM-Discussed treatment plan with pt at bedside . Pt agrees with plan.   Treatment plan initiated: Medications  cyclobenzaprine (FLEXERIL) tablet 10 mg (10 mg Oral Given 01/31/14 1535)     Initial diagnostic testing ordered.     Labs Review Labs Reviewed - No data to display Imaging Review No results found.  EKG Interpretation   None       MDM   1. Back pain with sciatica     Filed Vitals:   01/31/14 1538  BP: 162/90  Pulse: 67  Temp: 97.3 F (36.3 C)  Resp: 18   Afebrile, NAD, non-toxic appearing, AAOx4. Patient with back pain.  No neurological deficits and normal neuro exam.  Patient can walk but states is painful.  No loss of bowel or bladder control.  No concern for cauda equina.  No fever, night sweats, weight loss, h/o cancer, IVDU.  RICE protocol and pain medicine indicated and discussed with patient.     I personally performed the services described in this documentation, which was scribed in my presence. The recorded information has been reviewed and is accurate.     Jeannetta Ellis, PA-C 01/31/14 1605

## 2014-02-01 NOTE — ED Provider Notes (Signed)
Medical screening examination/treatment/procedure(s) were performed by non-physician practitioner and as supervising physician I was immediately available for consultation/collaboration.  EKG Interpretation   None         Charles B. Sheldon, MD 02/01/14 1610 

## 2014-02-15 ENCOUNTER — Encounter (HOSPITAL_COMMUNITY): Payer: Self-pay | Admitting: Emergency Medicine

## 2014-02-15 DIAGNOSIS — R609 Edema, unspecified: Secondary | ICD-10-CM | POA: Insufficient documentation

## 2014-02-15 DIAGNOSIS — F17200 Nicotine dependence, unspecified, uncomplicated: Secondary | ICD-10-CM | POA: Insufficient documentation

## 2014-02-15 DIAGNOSIS — M5430 Sciatica, unspecified side: Secondary | ICD-10-CM | POA: Insufficient documentation

## 2014-02-15 DIAGNOSIS — R269 Unspecified abnormalities of gait and mobility: Secondary | ICD-10-CM | POA: Insufficient documentation

## 2014-02-15 DIAGNOSIS — G8929 Other chronic pain: Secondary | ICD-10-CM | POA: Insufficient documentation

## 2014-02-15 DIAGNOSIS — I10 Essential (primary) hypertension: Secondary | ICD-10-CM | POA: Insufficient documentation

## 2014-02-15 NOTE — ED Notes (Signed)
Pt c/o left sided leg pain with mild swelling to lower left extremity noted around ankle. Sensation present. Skin warm, and dry. Skin color normal for race. Pt can wiggle digits

## 2014-02-16 ENCOUNTER — Emergency Department (HOSPITAL_COMMUNITY)
Admission: EM | Admit: 2014-02-16 | Discharge: 2014-02-16 | Disposition: A | Discharge status: Home / Self Care | Payer: Self-pay | Attending: Emergency Medicine | Admitting: Emergency Medicine

## 2014-02-16 DIAGNOSIS — M5430 Sciatica, unspecified side: Secondary | ICD-10-CM

## 2014-02-16 MED ORDER — DEXAMETHASONE SODIUM PHOSPHATE 10 MG/ML IJ SOLN
10.0000 mg | Freq: Once | INTRAMUSCULAR | Status: AC
  Administered 2014-02-16: 10 mg via INTRAMUSCULAR
  Filled 2014-02-16: qty 1

## 2014-02-16 MED ORDER — NAPROXEN 500 MG PO TABS: 500.0000 mg | ORAL_TABLET | Freq: Two times a day (BID) | ORAL | Status: DC

## 2014-02-16 MED ORDER — HYDROCODONE-ACETAMINOPHEN 5-325 MG PO TABS: 1.0000 | ORAL_TABLET | Freq: Four times a day (QID) | ORAL | Status: DC | PRN

## 2014-02-16 MED ORDER — KETOROLAC TROMETHAMINE 60 MG/2ML IM SOLN
60.0000 mg | Freq: Once | INTRAMUSCULAR | Status: AC
  Administered 2014-02-16: 60 mg via INTRAMUSCULAR
  Filled 2014-02-16: qty 2
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
## 2014-02-16 NOTE — Discharge Instructions (Signed)
Please follow up with community health and wellness. SEEK IMMEDIATE MEDICAL ATTENTION IF: New numbness, tingling, weakness, or problem with the use of your arms or legs.  Severe back pain not relieved with medications.  Change in bowel or bladder control.  Increasing pain in any areas of the body (such as chest or abdominal pain).  Shortness of breath, dizziness or fainting.  Nausea (feeling sick to your stomach), vomiting, fever, or sweats.   Chronic Pain Discharge Instructions  Emergency care providers appreciate that many patients coming to us are in severe pain and we wish to address their pain in the safest, most responsible manner.  It is important to recognize however, that the proper treatment of chronic pain differs from that of the pain of injuries and acute illnesses.  Our goal is to provide quality, safe, personalized care and we thank you for giving us the opportunity to serve you. The use of narcotics and related agents for chronic pain syndromes may lead to additional physical and psychological problems.  Nearly as many people die from prescription narcotics each year as die from car crashes.  Additionally, this risk is increased if such prescriptions are obtained from a variety of sources.  Therefore, only your primary care physician or a pain management specialist is able to safely treat such syndromes with narcotic medications long-term.    Documentation revealing such prescriptions have been sought from multiple sources may prohibit us from providing a refill or different narcotic medication.  Your name may be checked first through the [facility] Controlled Substances Reporting System.  This database is a record of controlled substance medication prescriptions that the patient has received.  This has been established by [facility] in an effort to eliminate the dangerous, and often life threatening, practice of obtaining multiple prescriptions from different medical providers.    If you have a chronic pain syndrome (i.e. chronic headaches, recurrent back or neck pain, dental pain, abdominal or pelvis pain without a specific diagnosis, or neuropathic pain such as fibromyalgia) or recurrent visits for the same condition without an acute diagnosis, you may be treated with non-narcotics and other non-addictive medicines.  Allergic reactions or negative side effects that may be reported by a patient to such medications will not typically lead to the use of a narcotic analgesic or other controlled substance as an alternative.   Patients managing chronic pain with a personal physician should have provisions in place for breakthrough pain.  If you are in crisis, you should call your physician.  If your physician directs you to the emergency department, please have the doctor call and speak to our attending physician concerning your care.   When patients come to the Emergency Department (ED) with acute medical conditions in which the Emergency Department physician feels appropriate to prescribe narcotic or sedating pain medication, the physician will prescribe these in very limited quantities.  The amount of these medications will last only until you can see your primary care physician in his/her office.  Any patient who returns to the ED seeking refills should expect only non-narcotic pain medications.   In the event of an acute medical condition exists and the emergency physician feels it is necessary that the patient be given a narcotic or sedating medication -  a responsible adult driver should be present in the room prior to the medication being given by the nurse.   Prescriptions for narcotic or sedating medications that have been lost, stolen or expired will not be refilled in  the Emergency Department.    Patients who have chronic pain may receive non-narcotic prescriptions until seen by their primary care physician.  It is every patients personal responsibility to maintain  active prescriptions with his or her primary care physician or specialist.   Chronic Back Pain  When back pain lasts longer than 3 months, it is called chronic back pain.People with chronic back pain often go through certain periods that are more intense (flare-ups).  CAUSES Chronic back pain can be caused by wear and tear (degeneration) on different structures in your back. These structures include:  The bones of your spine (vertebrae) and the joints surrounding your spinal cord and nerve roots (facets).  The strong, fibrous tissues that connect your vertebrae (ligaments). Degeneration of these structures may result in pressure on your nerves. This can lead to constant pain. HOME CARE INSTRUCTIONS  Avoid bending, heavy lifting, prolonged sitting, and activities which make the problem worse.  Take brief periods of rest throughout the day to reduce your pain. Lying down or standing usually is better than sitting while you are resting.  Take over-the-counter or prescription medicines only as directed by your caregiver. SEEK IMMEDIATE MEDICAL CARE IF:   You have weakness or numbness in one of your legs or feet.  You have trouble controlling your bladder or bowels.  You have nausea, vomiting, abdominal pain, shortness of breath, or fainting. Document Released: 01/22/2005 Document Revised: 03/08/2012 Document Reviewed: 11/29/2011 [employer] Patient Information 2014 [place], [place].

## 2014-02-20 NOTE — ED Provider Notes (Signed)
Medical screening examination/treatment/procedure(s) were performed by non-physician practitioner and as supervising physician I was immediately available for consultation/collaboration.  EKG Interpretation   None        Jeremiah Birkland K Dorien Mayotte-Rasch, MD 02/20/14 2322

## 2014-02-27 ENCOUNTER — Emergency Department (HOSPITAL_COMMUNITY)
Admission: EM | Admit: 2014-02-27 | Discharge: 2014-02-27 | Disposition: A | Discharge status: Home / Self Care | Payer: Self-pay | Attending: Emergency Medicine | Admitting: Emergency Medicine

## 2014-02-27 ENCOUNTER — Encounter (HOSPITAL_COMMUNITY): Payer: Self-pay | Admitting: Emergency Medicine

## 2014-02-27 DIAGNOSIS — R221 Localized swelling, mass and lump, neck: Secondary | ICD-10-CM

## 2014-02-27 DIAGNOSIS — R220 Localized swelling, mass and lump, head: Secondary | ICD-10-CM | POA: Insufficient documentation

## 2014-02-27 DIAGNOSIS — K130 Diseases of lips: Secondary | ICD-10-CM | POA: Insufficient documentation

## 2014-02-27 DIAGNOSIS — F17200 Nicotine dependence, unspecified, uncomplicated: Secondary | ICD-10-CM | POA: Insufficient documentation

## 2014-02-27 DIAGNOSIS — Z791 Long term (current) use of non-steroidal anti-inflammatories (NSAID): Secondary | ICD-10-CM | POA: Insufficient documentation

## 2014-02-27 DIAGNOSIS — I10 Essential (primary) hypertension: Secondary | ICD-10-CM | POA: Insufficient documentation

## 2014-02-27 MED ORDER — SULFAMETHOXAZOLE-TMP DS 800-160 MG PO TABS
1.0000 | ORAL_TABLET | Freq: Once | ORAL | Status: AC
  Administered 2014-02-27: 1 via ORAL
  Filled 2014-02-27: qty 1

## 2014-02-27 MED ORDER — CIPROFLOXACIN HCL 500 MG PO TABS
500.0000 mg | ORAL_TABLET | Freq: Once | ORAL | Status: AC
  Administered 2014-02-27: 500 mg via ORAL
  Filled 2014-02-27: qty 1

## 2014-02-27 MED ORDER — CIPROFLOXACIN HCL 500 MG PO TABS: 500.0000 mg | ORAL_TABLET | Freq: Two times a day (BID) | ORAL | Status: DC

## 2014-02-27 MED ORDER — DICLOFENAC SODIUM 75 MG PO TBEC: 75.0000 mg | DELAYED_RELEASE_TABLET | Freq: Two times a day (BID) | ORAL | Status: DC

## 2014-02-27 MED ORDER — SULFAMETHOXAZOLE-TRIMETHOPRIM 800-160 MG PO TABS: 1.0000 | ORAL_TABLET | Freq: Two times a day (BID) | ORAL | Status: AC

## 2014-02-27 NOTE — ED Notes (Signed)
Patient states he got a "pimple on his bottom lip on Sunday".  Patient states that he did pop it yesterday, but woke up this morning with possible abscess.

## 2014-02-27 NOTE — Discharge Instructions (Signed)
Please apply warm compresses to your lip 3-4 times daily. Please use Bactrim and Cipro 2 times daily with food. Use diclofenac 2 times daily for inflammation. Please return to the emergency department, or see her primary physician if any signs of infection including red streaking, fever, increased swelling or drainage. Abscess An abscess is an infected area that contains a collection of pus and debris.It can occur in almost any part of the body. An abscess is also known as a furuncle or boil. CAUSES  An abscess occurs when tissue gets infected. This can occur from blockage of oil or sweat glands, infection of hair follicles, or a minor injury to the skin. As the body tries to fight the infection, pus collects in the area and creates pressure under the skin. This pressure causes pain. People with weakened immune systems have difficulty fighting infections and get certain abscesses more often.  SYMPTOMS Usually an abscess develops on the skin and becomes a painful mass that is red, warm, and tender. If the abscess forms under the skin, you may feel a moveable soft area under the skin. Some abscesses break open (rupture) on their own, but most will continue to get worse without care. The infection can spread deeper into the body and eventually into the bloodstream, causing you to feel ill.  DIAGNOSIS  Your caregiver will take your medical history and perform a physical exam. A sample of fluid may also be taken from the abscess to determine what is causing your infection. TREATMENT  Your caregiver may prescribe antibiotic medicines to fight the infection. However, taking antibiotics alone usually does not cure an abscess. Your caregiver may need to make a small cut (incision) in the abscess to drain the pus. In some cases, gauze is packed into the abscess to reduce pain and to continue draining the area. HOME CARE INSTRUCTIONS   Only take over-the-counter or prescription medicines for pain, discomfort, or  fever as directed by your caregiver.  If you were prescribed antibiotics, take them as directed. Finish them even if you start to feel better.  If gauze is used, follow your caregiver's directions for changing the gauze.  To avoid spreading the infection:  Keep your draining abscess covered with a bandage.  Wash your hands well.  Do not share personal care items, towels, or whirlpools with others.  Avoid skin contact with others.  Keep your skin and clothes clean around the abscess.  Keep all follow-up appointments as directed by your caregiver. SEEK MEDICAL CARE IF:   You have increased pain, swelling, redness, fluid drainage, or bleeding.  You have muscle aches, chills, or a general ill feeling.  You have a fever. MAKE SURE YOU:   Understand these instructions.  Will watch your condition.  Will get help right away if you are not doing well or get worse. Document Released: 09/24/2005 Document Revised: 06/15/2012 Document Reviewed: 02/27/2012 [facility] Patient Information 2014 [place].

## 2014-02-27 NOTE — ED Notes (Signed)
Beverely PaceBryant, PA at bedside for evaluation.

## 2014-02-27 NOTE — ED Provider Notes (Signed)
CSN: [id number]     Arrival date & time 02/27/14  0829 History  This chart was scribed for non-physician practitioner, Ivery QualeHobson Semone Orlov, PA-C working with Suzi RootsKevin E Steinl, MD by Greggory StallionKayla Andersen, ED scribe. This patient was seen in room TR06C/TR06C and the patient's care was started at 9:24 AM.   Chief Complaint  Patient presents with  . Abscess   The history is provided by the patient. No language interpreter was used.   HPI Comments: Jeremiah Curtis is a 33 y.o. male with history of hypertension who presents to the Emergency Department complaining of an abscess to his bottom lip that started yesterday. He thought he had a pimple 2 days ago and tried to pop it. Increased swelling and redness started yesterday. Denies history of diabetes or MRSA.   Past Medical History  Diagnosis Date  . Back symptoms, other   . Hypertension    Past Surgical History  Procedure Laterality Date  . Back surgery     No family history on file. History  Substance Use Topics  . Smoking status: Current Every Day Smoker -- 0.50 packs/day    Types: Cigarettes  . Smokeless tobacco: Never Used  . Alcohol Use: Yes    Review of Systems  HENT: Positive for facial swelling.   Skin:       Positive for abscess.   All other systems reviewed and are negative.   Allergies  Fish allergy  Home Medications   Current Outpatient Rx  Name  Route  Sig  Dispense  Refill  . HYDROcodone-acetaminophen (NORCO) 5-325 MG per tablet   Oral   Take 1-2 tablets by mouth every 6 (six) hours as needed for moderate pain.   20 tablet   0   . naproxen (NAPROSYN) 500 MG tablet   Oral   Take 1 tablet (500 mg total) by mouth 2 (two) times daily with a meal.   30 tablet   0    BP 186/107  Pulse 92  Temp(Src) 98.6 F (37 C) (Oral)  Resp 18  Ht 5\' 5"  (1.651 m)  Wt 195 lb (88.451 kg)  BMI 32.45 kg/m2  SpO2 100%  Physical Exam  Nursing note and vitals reviewed. Constitutional: He is oriented to person, place, and time.  He appears well-developed and well-nourished. No distress.  HENT:  Head: Normocephalic and atraumatic.  No upper lip pain or lesion. Draining lesion of left lower lip. Swelling of the left lower lip. No involvement of the mucosa of the lower lip. Palpable submental lymph nodes.   Eyes: EOM are normal.  Neck: Neck supple. No tracheal deviation present.  Cardiovascular: Normal rate.   Pulmonary/Chest: Effort normal and breath sounds normal. No respiratory distress. He has no wheezes. He has no rhonchi. He has no rales.  Musculoskeletal: Normal range of motion.  Neurological: He is alert and oriented to person, place, and time.  Skin: Skin is warm and dry.  Psychiatric: He has a normal mood and affect. His behavior is normal.    ED Course  Procedures (including critical care time)  DIAGNOSTIC STUDIES: Oxygen Saturation is 100% on RA, normal by my interpretation.    COORDINATION OF CARE: 9:28 AM-Discussed treatment plan which includes two antibiotics, an anti-inflammatory and warm compresses with pt at bedside and pt agreed to plan. Return precautions given to pt.   Labs Review Labs Reviewed - No data to display Imaging Review No results found.   EKG Interpretation None      MDM Patient  presented to the emergency department with a pimple of the lower lip that he ruptured a couple of days ago, and he now has increased swelling of the lower lip. Patient has not had any fever or chills related to this. He has had some mild mild drainage.  The plan at this time is for the patient to have coverage with Bactrim and Cipro. He'll also use diclofenac 2 times daily. There is no evidence on examination for advanced facial cellulitis, as no high fever reported. No nausea vomiting reported. There's no swelling under the tongue. The airway is not compromised. Patient has been advised to return to the emergency department if any changes, signs of advancing infection, or problems.    Final  diagnoses:  None    **I have reviewed nursing notes, vital signs, and all appropriate lab and imaging results for this patient.*  *I personally performed the services described in this documentation, which was scribed in my presence. The recorded information has been reviewed and is accurate.Kathie Dike, PA-C 02/27/14 [phone]

## 2014-02-28 NOTE — ED Provider Notes (Signed)
Medical screening examination/treatment/procedure(s) were performed by non-physician practitioner and as supervising physician I was immediately available for consultation/collaboration.   EKG Interpretation None        Suzi RootsKevin E Jordie Skalsky, MD 02/28/14 [phone]

## 2018-03-13 ENCOUNTER — Other Ambulatory Visit: Payer: Self-pay

## 2018-03-13 ENCOUNTER — Encounter (HOSPITAL_COMMUNITY): Payer: Self-pay | Admitting: Emergency Medicine

## 2018-03-13 ENCOUNTER — Emergency Department (HOSPITAL_COMMUNITY): Payer: Self-pay

## 2018-03-13 DIAGNOSIS — I10 Essential (primary) hypertension: Secondary | ICD-10-CM | POA: Insufficient documentation

## 2018-03-13 DIAGNOSIS — F17210 Nicotine dependence, cigarettes, uncomplicated: Secondary | ICD-10-CM | POA: Insufficient documentation

## 2018-03-13 DIAGNOSIS — Z79899 Other long term (current) drug therapy: Secondary | ICD-10-CM | POA: Insufficient documentation

## 2018-03-13 DIAGNOSIS — R0789 Other chest pain: Secondary | ICD-10-CM | POA: Insufficient documentation

## 2018-03-13 LAB — BASIC METABOLIC PANEL
Anion gap: 11 (ref 5–15)
BUN: 14 mg/dL (ref 6–20)
CO2: 23 mmol/L (ref 22–32)
Calcium: 9.1 mg/dL (ref 8.9–10.3)
Chloride: 105 mmol/L (ref 101–111)
Creatinine, Ser: 1.04 mg/dL (ref 0.61–1.24)
GFR calc Af Amer: >60 mL/min (ref >60)
GFR calc non Af Amer: >60 mL/min (ref >60)
Glucose, Bld: 106 mg/dL — ABNORMAL HIGH (ref 65–99)
Potassium: 3.9 mmol/L (ref 3.5–5.1)
Sodium: 139 mmol/L (ref 135–145)

## 2018-03-13 LAB — CBC
HCT: 46.1 % (ref 39.0–52.0)
Hemoglobin: 15.3 g/dL (ref 13.0–17.0)
MCH: 29.5 pg (ref 26.0–34.0)
MCHC: 33.2 g/dL (ref 30.0–36.0)
MCV: 89 fL (ref 78.0–100.0)
Platelets: 79 10*3/uL — ABNORMAL LOW (ref 150–400)
RBC: 5.18 MIL/uL (ref 4.22–5.81)
RDW: 14 % (ref 11.5–15.5)
WBC: 5.8 10*3/uL (ref 4.0–10.5)

## 2018-03-13 LAB — I-STAT TROPONIN, ED: Troponin i, poc: 0 ng/mL (ref 0.00–0.08)

## 2018-03-13 NOTE — ED Triage Notes (Signed)
Patient presents to ED for assessment of left sided chest pain starting 2 days ago.  Patient states worsening, sharp pain.  Patient c/o light-headedness, "back of the head" pain and malaise.

## 2018-03-14 ENCOUNTER — Emergency Department (HOSPITAL_COMMUNITY)
Admission: EM | Admit: 2018-03-14 | Discharge: 2018-03-14 | Disposition: A | Discharge status: Home / Self Care | Payer: Self-pay | Attending: Emergency Medicine | Admitting: Emergency Medicine

## 2018-03-14 DIAGNOSIS — R0789 Other chest pain: Secondary | ICD-10-CM

## 2018-03-14 LAB — I-STAT TROPONIN, ED: Troponin i, poc: 0.01 ng/mL (ref 0.00–0.08)

## 2018-03-14 LAB — D-DIMER, QUANTITATIVE: D-Dimer, Quant: <0.27 ug/mL-FEU (ref 0.00–0.50)

## 2018-03-14 MED ORDER — NAPROXEN 500 MG PO TABS: 500.0000 mg | ORAL_TABLET | Freq: Two times a day (BID) | ORAL | 0 refills | Status: DC | PRN

## 2018-03-14 MED ORDER — IBUPROFEN 800 MG PO TABS
800.0000 mg | ORAL_TABLET | Freq: Once | ORAL | Status: AC
  Administered 2018-03-14: 800 mg via ORAL
  Filled 2018-03-14: qty 1

## 2018-03-14 NOTE — ED Provider Notes (Signed)
[facility] EMERGENCY DEPARTMENT Provider Note   CSN: [id number] Arrival date & time: 03/13/18  2213     History   Chief Complaint Chief Complaint  Patient presents with  . Chest Pain    HPI Jeremiah Curtis is a 37 y.o. male.  HPI   37 year old male with hx of HTN presenting for evaluation of chest pain.  Patient report for the past 3 days he has had persistent substernal chest pain.  He described pain as a sharp sensation, worse with cough or with taking deep breath.  Pain is moderate in severity, and pain can be reproducible by pushing on his chest.  Pain is nonexertional without any dizziness, diaphoretic, or nausea.  He does however endorse lightheadedness and feeling weak.  He does report significant family history of premature cardiac death.  He is a smoker and smokes half a pack daily.  He denies any recreational drug use.  Denies any prior history of PE or DVT, no recent surgery, prolonged bed rest, your leg swelling or calf pain, active cancer or hemoptysis.  He does take ibuprofen at home for pain with minimal improvement.  He denies any strenuous activities or heavy lifting.  Past Medical History:  Diagnosis Date  . Back symptoms, other   . Hypertension     There are no active problems to display for this patient.   Past Surgical History:  Procedure Laterality Date  . BACK SURGERY         Home Medications    Prior to Admission medications   Medication Sig Start Date End Date Taking? Authorizing Provider  ciprofloxacin (CIPRO) 500 MG tablet Take 1 tablet (500 mg total) by mouth 2 (two) times daily. 02/27/14   Ivery Quale, PA-C  diclofenac (VOLTAREN) 75 MG EC tablet Take 1 tablet (75 mg total) by mouth 2 (two) times daily. 02/27/14   Ivery Quale, PA-C  HYDROcodone-acetaminophen (NORCO) 5-325 MG per tablet Take 1-2 tablets by mouth every 6 (six) hours as needed for moderate pain. 02/16/14   Arthor Captain, PA-C  naproxen (NAPROSYN) 500 MG  tablet Take 1 tablet (500 mg total) by mouth 2 (two) times daily with a meal. 02/16/14   Arthor Captain, PA-C    Family History History reviewed. No pertinent family history.  Social History Social History   Tobacco Use  . Smoking status: Current Every Day Smoker    Packs/day: 0.50    Types: Cigarettes  . Smokeless tobacco: Never Used  Substance Use Topics  . Alcohol use: No    Frequency: Never  . Drug use: No     Allergies   Fish allergy   Review of Systems Review of Systems  All other systems reviewed and are negative.    Physical Exam Updated Vital Signs BP (!) 168/109 (BP Location: Right Arm)   Pulse 86   Temp 98.7 F (37.1 C) (Oral)   Resp 18   Ht 5\' 5"  (1.651 m)   Wt 78.9 kg (174 lb)   SpO2 100%   BMI 28.96 kg/m   Physical Exam  Constitutional: He appears well-developed and well-nourished. No distress.  HENT:  Head: Atraumatic.  Eyes: Conjunctivae are normal.  Neck: Neck supple.  Cardiovascular: Normal rate, regular rhythm, intact distal pulses and normal pulses.  No murmur heard. Pulmonary/Chest: Effort normal and breath sounds normal.  Chest wall tenderness to palpation most significant to the sternal region.  Abdominal: Soft. There is no tenderness.  Musculoskeletal:  Right lower leg: He exhibits no edema.       Left lower leg: He exhibits no edema.  Neurological: He is alert.  Skin: No rash noted.  Psychiatric: He has a normal mood and affect.  Nursing note and vitals reviewed.    ED Treatments / Results  Labs (all labs ordered are listed, but only abnormal results are displayed) Labs Reviewed  BASIC METABOLIC PANEL - Abnormal; Notable for the following components:      Result Value   Glucose, Bld 106 (*)    All other components within normal limits  CBC - Abnormal; Notable for the following components:   Platelets 79 (*)    All other components within normal limits  D-DIMER, QUANTITATIVE (NOT AT [facility])  I-STAT TROPONIN, ED    I-STAT TROPONIN, ED    EKG  EKG Interpretation None      Date: 03/14/2018  Rate: 84  Rhythm: normal sinus rhythm  QRS Axis: normal  Intervals: normal  ST/T Wave abnormalities: normal  Conduction Disutrbances: none  Narrative Interpretation:   Old EKG Reviewed: No significant changes noted     Radiology Dg Chest 2 View  Result Date: 03/13/2018 CLINICAL DATA:  Chest pain. Left-sided chest pain for years, worse over the past 3 days. Shortness of breath and cough. EXAM: CHEST - 2 VIEW COMPARISON:  None. FINDINGS: The cardiomediastinal contours are normal. The lungs are clear. Pulmonary vasculature is normal. No consolidation, pleural effusion, or pneumothorax. No acute osseous abnormalities are seen. IMPRESSION: Unremarkable radiographs of the chest. Electronically Signed   By: Rubye OaksMelanie  Ehinger M.D.   On: 03/13/2018 23:18    Procedures Procedures (including critical care time)  Medications Ordered in ED Medications  ibuprofen (ADVIL,MOTRIN) tablet 800 mg (800 mg Oral Given 03/14/18 0243)     Initial Impression / Assessment and Plan / ED Course  I have reviewed the triage vital signs and the nursing notes.  Pertinent labs & imaging results that were available during my care of the patient were reviewed by me and considered in my medical decision making (see chart for details).     BP (!) 142/88 (BP Location: Right Arm)   Pulse 61   Temp 98.7 F (37.1 C) (Oral)   Resp 16   Ht 5\' 5"  (1.651 m)   Wt 78.9 kg (174 lb)   SpO2 100%   BMI 28.96 kg/m    Final Clinical Impressions(s) / ED Diagnoses   Final diagnoses:  Chest wall pain    ED Discharge Orders        Ordered    naproxen (NAPROSYN) 500 MG tablet  2 times daily PRN     03/14/18 0348     2:41 AM Patient here with reproducible chest wall pain which has been ongoing for the past 3 days.  Pain is atypical of ACS.  He does endorse significant family history of premature cardiac death.  He does not have any  significant risk factor for PE.  A d-dimer will be obtained.  Initial troponin, EKG, and chest x-ray are unremarkable.  Will obtain serial troponin.  Ibuprofen given for pain.  3:47 AM Normal d-dimer low suspicion for PE.  Normal serial troponin unlikely to be ACS.  Labs otherwise reassuring.  Chest x-ray normal.  Patient report improvement of his condition after receiving ibuprofen.  Patient sent home for further evaluation with PCP as needed.  Return precautions discussed.  No acute emergent medical condition identified.   Fayrene Helperran, Dayonna Selbe, PA-C 03/14/18 [phone]  Glynn Octave, MD 03/14/18 [phone]

## 2018-03-14 NOTE — ED Notes (Signed)
Nurse drawing labs. 

## 2018-03-21 ENCOUNTER — Emergency Department (HOSPITAL_COMMUNITY)
Admission: EM | Admit: 2018-03-21 | Discharge: 2018-03-21 | Disposition: A | Discharge status: Home / Self Care | Payer: Self-pay | Attending: Emergency Medicine | Admitting: Emergency Medicine

## 2018-03-21 ENCOUNTER — Encounter (HOSPITAL_COMMUNITY): Payer: Self-pay | Admitting: *Deleted

## 2018-03-21 ENCOUNTER — Emergency Department (HOSPITAL_COMMUNITY): Payer: Self-pay

## 2018-03-21 ENCOUNTER — Other Ambulatory Visit: Payer: Self-pay

## 2018-03-21 DIAGNOSIS — F17210 Nicotine dependence, cigarettes, uncomplicated: Secondary | ICD-10-CM | POA: Insufficient documentation

## 2018-03-21 DIAGNOSIS — I10 Essential (primary) hypertension: Secondary | ICD-10-CM | POA: Insufficient documentation

## 2018-03-21 DIAGNOSIS — J0100 Acute maxillary sinusitis, unspecified: Secondary | ICD-10-CM | POA: Insufficient documentation

## 2018-03-21 DIAGNOSIS — R51 Headache: Secondary | ICD-10-CM | POA: Insufficient documentation

## 2018-03-21 DIAGNOSIS — R519 Headache, unspecified: Secondary | ICD-10-CM

## 2018-03-21 DIAGNOSIS — Z79899 Other long term (current) drug therapy: Secondary | ICD-10-CM | POA: Insufficient documentation

## 2018-03-21 LAB — COMPREHENSIVE METABOLIC PANEL
ALBUMIN: 3.9 g/dL (ref 3.5–5.0)
ALK PHOS: 54 U/L (ref 38–126)
ALT: 34 U/L (ref 17–63)
ANION GAP: 6 (ref 5–15)
AST: 27 U/L (ref 15–41)
BUN: 17 mg/dL (ref 6–20)
CALCIUM: 9 mg/dL (ref 8.9–10.3)
CO2: 26 mmol/L (ref 22–32)
Chloride: 110 mmol/L (ref 101–111)
Creatinine, Ser: 0.9 mg/dL (ref 0.61–1.24)
GFR calc Af Amer: >60 mL/min (ref >60)
GFR calc non Af Amer: >60 mL/min (ref >60)
GLUCOSE: 110 mg/dL — AB (ref 65–99)
POTASSIUM: 3.9 mmol/L (ref 3.5–5.1)
Sodium: 142 mmol/L (ref 135–145)
Total Bilirubin: 0.3 mg/dL (ref 0.3–1.2)
Total Protein: 6.6 g/dL (ref 6.5–8.1)

## 2018-03-21 LAB — CBC WITH DIFFERENTIAL/PLATELET
Basophils Absolute: 0 10*3/uL (ref 0.0–0.1)
Basophils Relative: 0 %
EOS PCT: 3 %
Eosinophils Absolute: 0.2 10*3/uL (ref 0.0–0.7)
HEMATOCRIT: 45.3 % (ref 39.0–52.0)
Hemoglobin: 14.6 g/dL (ref 13.0–17.0)
LYMPHS PCT: 32 %
Lymphs Abs: 2 10*3/uL (ref 0.7–4.0)
MCH: 28.9 pg (ref 26.0–34.0)
MCHC: 32.2 g/dL (ref 30.0–36.0)
MCV: 89.5 fL (ref 78.0–100.0)
MONO ABS: 0.3 10*3/uL (ref 0.1–1.0)
MONOS PCT: 4 %
NEUTROS ABS: 3.9 10*3/uL (ref 1.7–7.7)
Neutrophils Relative %: 61 %
Platelets: 91 10*3/uL — ABNORMAL LOW (ref 150–400)
RBC: 5.06 MIL/uL (ref 4.22–5.81)
RDW: 14.1 % (ref 11.5–15.5)
WBC: 6.4 10*3/uL (ref 4.0–10.5)

## 2018-03-21 LAB — PROTIME-INR
INR: 0.99
Prothrombin Time: 13 seconds (ref 11.4–15.2)

## 2018-03-21 MED ORDER — METOCLOPRAMIDE HCL 5 MG/ML IJ SOLN
10.0000 mg | Freq: Once | INTRAMUSCULAR | Status: AC
  Administered 2018-03-21: 10 mg via INTRAVENOUS
  Filled 2018-03-21: qty 2

## 2018-03-21 MED ORDER — DEXAMETHASONE 4 MG PO TABS
10.0000 mg | ORAL_TABLET | Freq: Once | ORAL | Status: AC
  Administered 2018-03-21: 10 mg via ORAL
  Filled 2018-03-21: qty 2

## 2018-03-21 MED ORDER — SODIUM CHLORIDE 0.9 % IV BOLUS (SEPSIS)
500.0000 mL | Freq: Once | INTRAVENOUS | Status: AC
  Administered 2018-03-21: 500 mL via INTRAVENOUS

## 2018-03-21 MED ORDER — DOXYCYCLINE HYCLATE 100 MG PO CAPS: 100.0000 mg | ORAL_CAPSULE | Freq: Two times a day (BID) | ORAL | 0 refills | Status: DC

## 2018-03-21 MED ORDER — MAGNESIUM SULFATE 2 GM/50ML IV SOLN
2.0000 g | Freq: Once | INTRAVENOUS | Status: AC
  Administered 2018-03-21: 2 g via INTRAVENOUS
  Filled 2018-03-21: qty 50

## 2018-03-21 NOTE — ED Provider Notes (Signed)
[facility] Provider Note   CSN: [id number] Arrival date & time: 03/21/18  1112     History   Chief Complaint Chief Complaint  Patient presents with  . Headache    HPI Jeremiah Curtis is a 37 y.o. male.  The history is provided by the patient. No language interpreter was used.  Headache      Jeremiah Curtis is a 37 y.o. male who presents to the Emergency Department complaining of HA. He reports waking this morning at 830 with a severe right-sided headache. Pain is described as aching and constant in nature. He has associated photophobia as well as blurred vision. No numbness, weakness, fever, chills, neck pain. He has a history of headache in the past but this is very different from his prior headaches. 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 He has never needed treatment for this.  Past Medical History:  Diagnosis Date  . Back symptoms, other   . Hypertension     There are no active problems to display for this patient.   Past Surgical History:  Procedure Laterality Date  . BACK SURGERY          Home Medications    Prior to Admission medications   Medication Sig Start Date End Date Taking? Authorizing Provider  acetaminophen (TYLENOL) 500 MG tablet Take 500-1,000 mg by mouth every 6 (six) hours as needed for moderate pain.   Yes [provider]  ciprofloxacin (CIPRO) 500 MG tablet Take 1 tablet (500 mg total) by mouth 2 (two) times daily. Patient not taking: Reported on 03/21/2018 02/27/14   Ivery Quale, PA-C  diclofenac (VOLTAREN) 75 MG EC tablet Take 1 tablet (75 mg total) by mouth 2 (two) times daily. Patient not taking: Reported on 03/21/2018 02/27/14   Ivery Quale, PA-C  doxycycline (VIBRAMYCIN) 100 MG capsule Take 1 capsule (100 mg total) by mouth 2 (two) times daily. 03/21/18   Tilden Fossa, MD  HYDROcodone-acetaminophen [facility]) 5-325 MG per tablet Take 1-2 tablets by mouth every  6 (six) hours as needed for moderate pain. Patient not taking: Reported on 03/21/2018 02/16/14   Arthor Captain, PA-C  naproxen (NAPROSYN) 500 MG tablet Take 1 tablet (500 mg total) by mouth 2 (two) times daily as needed. Patient not taking: Reported on 03/21/2018 03/14/18   Fayrene Helper, PA-C    Family History No family history on file.  Social History Social History   Tobacco Use  . Smoking status: Current Every Day Smoker    Packs/day: 0.50    Types: Cigarettes  . Smokeless tobacco: Never Used  Substance Use Topics  . Alcohol use: No    Frequency: Never  . Drug use: No     Allergies   Fish allergy   Review of Systems Review of Systems  Neurological: Positive for headaches.  All other systems reviewed and are negative.    Physical Exam Updated Vital Signs BP 125/77   Pulse 67   Temp 98.6 F (37 C) (Oral)   Resp 18   Ht 5\' 5"  (1.651 m)   Wt 78.9 kg (174 lb)   SpO2 98%   BMI 28.96 kg/m   Physical Exam  Constitutional: He is oriented to person, place, and time. He appears well-developed and well-nourished. No distress.  HENT:  Head: Normocephalic and atraumatic.  Eyes: Pupils are equal, round, and reactive to light. EOM are normal.  Neck: Neck supple.  Cardiovascular: Normal rate and regular  rhythm.  No murmur heard. Pulmonary/Chest: Effort normal and breath sounds normal. No respiratory distress.  Abdominal: Soft. There is no tenderness. There is no rebound and no guarding.  Musculoskeletal: He exhibits no edema or tenderness.  Neurological: He is alert and oriented to person, place, and time.  Five out of five strength in all four extremities with sensational light touch intact in all four extremities. Visual fields grossly intact. No facial asymmetry. No pronator drift.  Skin: Skin is warm and dry.  Psychiatric: He has a normal mood and affect. His behavior is normal.  Nursing note and vitals reviewed.    ED Treatments / Results  Labs (all labs  ordered are listed, but only abnormal results are displayed) Labs Reviewed  COMPREHENSIVE METABOLIC PANEL - Abnormal; Notable for the following components:      Result Value   Glucose, Bld 110 (*)    All other components within normal limits  CBC WITH DIFFERENTIAL/PLATELET - Abnormal; Notable for the following components:   Platelets 91 (*)    All other components within normal limits  PROTIME-INR    EKG None  Radiology Ct Head Wo Contrast  Result Date: 03/21/2018 CLINICAL DATA:  Headache and light sensitivity. EXAM: CT HEAD WITHOUT CONTRAST TECHNIQUE: Contiguous axial images were obtained from the base of the skull through the vertex without intravenous contrast. COMPARISON:  None. FINDINGS: Brain: Normal appearing cerebral hemispheres and posterior fossa structures. Normal size and position of the ventricles. No intracranial hemorrhage, mass lesion or CT evidence of acute infarction. Vascular: No hyperdense vessel or unexpected calcification. Skull: Normal. Negative for fracture or focal lesion. Sinuses/Orbits: Minimal right maxillary sinus mucosal thickening and a tiny left maxillary sinus retention cyst. Unremarkable orbits. Other: None. IMPRESSION: 1. No intracranial abnormality. 2. Minimal chronic right maxillary sinusitis. Electronically Signed   By: Beckie Salts M.D.   On: 03/21/2018 13:59    Procedures Procedures (including critical care time)  Medications Ordered in ED Medications  metoCLOPramide (REGLAN) injection 10 mg (10 mg Intravenous Given 03/21/18 1352)  sodium chloride 0.9 % bolus 500 mL (0 mLs Intravenous Stopped 03/21/18 1433)  magnesium sulfate IVPB 2 g 50 mL (0 g Intravenous Stopped 03/21/18 1527)  dexamethasone (DECADRON) tablet 10 mg (10 mg Oral Given 03/21/18 1611)     Initial Impression / Assessment and Plan / ED Course  I have reviewed the triage vital signs and the nursing notes.  Pertinent labs & imaging results that were available during my care of the  patient were reviewed by me and considered in my medical decision making (see chart for details).     Pt with hx/o ITP here for evaluation of HA.  He is nontoxic appearing on exam without focal neuro deficits.  CT head obtained given patient's hx/o ITP - negative for acute process.  Labs demonstrate improvement in platelet count compared to priors.  On repeat assessment his HA is resolved following treatment.  On repeat exam patient reports nasal congestion and drainage starting today, greater on the right.  Given CT scan findings of possible sinusitis will treat with decongestant as well as abx with his given sxs.  No concerning features for SAH, dural sinus thrombosis, meningitis, temporal artertitis.  Discussed with patient home care, outpatient follow up and return precautions.    Final Clinical Impressions(s) / ED Diagnoses   Final diagnoses:  Bad headache  Acute non-recurrent maxillary sinusitis    ED Discharge Orders        Ordered  doxycycline (VIBRAMYCIN) 100 MG capsule  2 times daily     03/21/18 1537       Tilden Fossaees, Kaylei Frink, MD 03/21/18 1940

## 2018-03-21 NOTE — ED Triage Notes (Signed)
Pt BIB EMS with headache while at an event. States he has photo and audible sensitivity but has listened to something on phone all the way in and not showing any light sensitivity. Slight vertigo reported, ambulates without problems

## 2018-04-05 ENCOUNTER — Emergency Department (HOSPITAL_COMMUNITY): Payer: Self-pay

## 2018-04-05 ENCOUNTER — Encounter (HOSPITAL_COMMUNITY): Payer: Self-pay

## 2018-04-05 ENCOUNTER — Other Ambulatory Visit: Payer: Self-pay

## 2018-04-05 ENCOUNTER — Emergency Department (HOSPITAL_COMMUNITY)
Admission: EM | Admit: 2018-04-05 | Discharge: 2018-04-05 | Disposition: A | Discharge status: Home / Self Care | Payer: Self-pay | Attending: Emergency Medicine | Admitting: Emergency Medicine

## 2018-04-05 DIAGNOSIS — F17210 Nicotine dependence, cigarettes, uncomplicated: Secondary | ICD-10-CM | POA: Insufficient documentation

## 2018-04-05 DIAGNOSIS — Z79899 Other long term (current) drug therapy: Secondary | ICD-10-CM | POA: Insufficient documentation

## 2018-04-05 DIAGNOSIS — R0789 Other chest pain: Secondary | ICD-10-CM | POA: Insufficient documentation

## 2018-04-05 DIAGNOSIS — I10 Essential (primary) hypertension: Secondary | ICD-10-CM | POA: Insufficient documentation

## 2018-04-05 HISTORY — DX: Immune thrombocytopenic purpura: D69.3

## 2018-04-05 LAB — I-STAT TROPONIN, ED
TROPONIN I, POC: 0.01 ng/mL (ref 0.00–0.08)
Troponin i, poc: 0.01 ng/mL (ref 0.00–0.08)

## 2018-04-05 LAB — BASIC METABOLIC PANEL
Anion gap: 9 (ref 5–15)
BUN: 15 mg/dL (ref 6–20)
CO2: 22 mmol/L (ref 22–32)
Calcium: 8.9 mg/dL (ref 8.9–10.3)
Chloride: 107 mmol/L (ref 101–111)
Creatinine, Ser: 1.09 mg/dL (ref 0.61–1.24)
GFR calc Af Amer: >60 mL/min (ref >60)
Glucose, Bld: 141 mg/dL — ABNORMAL HIGH (ref 65–99)
POTASSIUM: 3.8 mmol/L (ref 3.5–5.1)
SODIUM: 138 mmol/L (ref 135–145)

## 2018-04-05 LAB — CBC
HEMATOCRIT: 43.8 % (ref 39.0–52.0)
Hemoglobin: 14.3 g/dL (ref 13.0–17.0)
MCH: 28.5 pg (ref 26.0–34.0)
MCHC: 32.6 g/dL (ref 30.0–36.0)
MCV: 87.4 fL (ref 78.0–100.0)
Platelets: 86 10*3/uL — ABNORMAL LOW (ref 150–400)
RBC: 5.01 MIL/uL (ref 4.22–5.81)
RDW: 14.3 % (ref 11.5–15.5)
WBC: 8.3 10*3/uL (ref 4.0–10.5)

## 2018-04-05 MED ORDER — GI COCKTAIL ~~LOC~~
30.0000 mL | Freq: Once | ORAL | Status: AC
  Administered 2018-04-05: 30 mL via ORAL
  Filled 2018-04-05: qty 30

## 2018-04-05 MED ORDER — LIDOCAINE 5 % EX PTCH: 1.0000 | MEDICATED_PATCH | CUTANEOUS | 0 refills | Status: DC

## 2018-04-05 MED ORDER — METHOCARBAMOL 500 MG PO TABS: 500.0000 mg | ORAL_TABLET | Freq: Two times a day (BID) | ORAL | 0 refills | Status: DC

## 2018-04-05 NOTE — ED Provider Notes (Signed)
[facility] EMERGENCY DEPARTMENT Provider Note   CSN: [id number] Arrival date & time: 04/05/18  0009     History   Chief Complaint Chief Complaint  Patient presents with  . Chest Pain    HPI Josuha Fontanez is a 37 y.o. male.  HPI 37 year old male past medical history significant for hypertension and ITP presents to the emergency department today for evaluation of chest pain.  Patient states that his pain started probably 1-2 days ago has persisted.  The pain is substernal and left side of his chest.  Describes the pain as sharp and burning sensation.  Pain does not radiate.  States the pain is worse with palpation.  Denies any exertional chest pain or pleuritic chest pain.  Denies any associated cough or shortness of breath.  Patient has tried Tylenol that does help his symptoms.  States he has a history of this that has been diagnosed with muscular skeletal pain before that improved with ibuprofen.  Patient reports tobacco use.  Denies recreational drug use.  Denies any history of PE/DVT, prolonged immobilization or recent hospitalization/surgeries, unilateral leg swelling, calf tenderness, history of cancer or hemoptysis.  Denies any strenuous activities or heavy lifting.  Denies cardiac history.  Pt denies any fever, chill, ha, vision changes, lightheadedness, dizziness, congestion, neck pain, sob, cough, abd pain, n/v/d, urinary symptoms, change in bowel habits, melena, hematochezia, lower extremity paresthesias.  Past Medical History:  Diagnosis Date  . Back symptoms, other   . Hypertension   . Idiopathic thrombocytopenic purpura (ITP) (HCC)     There are no active problems to display for this patient.   Past Surgical History:  Procedure Laterality Date  . BACK SURGERY          Home Medications    Prior to Admission medications   Medication Sig Start Date End Date Taking? Authorizing Provider  ciprofloxacin (CIPRO) 500 MG tablet Take 1 tablet  (500 mg total) by mouth 2 (two) times daily. Patient not taking: Reported on 03/21/2018 02/27/14   Ivery Quale, PA-C  diclofenac (VOLTAREN) 75 MG EC tablet Take 1 tablet (75 mg total) by mouth 2 (two) times daily. Patient not taking: Reported on 03/21/2018 02/27/14   Ivery Quale, PA-C  doxycycline (VIBRAMYCIN) 100 MG capsule Take 1 capsule (100 mg total) by mouth 2 (two) times daily. Patient not taking: Reported on 04/05/2018 03/21/18   Tilden Fossa, MD  HYDROcodone-acetaminophen [facility]) 5-325 MG per tablet Take 1-2 tablets by mouth every 6 (six) hours as needed for moderate pain. Patient not taking: Reported on 03/21/2018 02/16/14   Arthor Captain, PA-C  lidocaine (LIDODERM) 5 % Place 1 patch onto the skin daily. Remove & Discard patch within 12 hours or as directed by MD 04/05/18   Rise Mu, PA-C  methocarbamol (ROBAXIN) 500 MG tablet Take 1 tablet (500 mg total) by mouth 2 (two) times daily. 04/05/18   Rise Mu, PA-C  naproxen (NAPROSYN) 500 MG tablet Take 1 tablet (500 mg total) by mouth 2 (two) times daily as needed. Patient not taking: Reported on 03/21/2018 03/14/18   Fayrene Helper, PA-C    Family History No family history on file.  Social History Social History   Tobacco Use  . Smoking status: Current Every Day Smoker    Packs/day: 0.50    Types: Cigarettes  . Smokeless tobacco: Never Used  Substance Use Topics  . Alcohol use: Yes    Frequency: Never  . Drug use: No  Allergies   Fish allergy   Review of Systems Review of Systems  All other systems reviewed and are negative.    Physical Exam Updated Vital Signs BP (!) 163/101   Pulse 63   Temp 97.6 F (36.4 C)   Resp (!) 8   Ht 5\' 5"  (1.651 m)   Wt 79.4 kg (175 lb)   SpO2 100%   BMI 29.12 kg/m   Physical Exam  Constitutional: He is oriented to person, place, and time. He appears well-developed and well-nourished.  Non-toxic appearance. No distress.  HENT:  Head: Normocephalic and  atraumatic.  Nose: Nose normal.  Mouth/Throat: Oropharynx is clear and moist.  Eyes: Pupils are equal, round, and reactive to light. Conjunctivae are normal. Right eye exhibits no discharge. Left eye exhibits no discharge.  Neck: Normal range of motion. Neck supple. No JVD present. No tracheal deviation present.  Cardiovascular: Normal rate, regular rhythm, normal heart sounds and intact distal pulses. Exam reveals no gallop and no friction rub.  No murmur heard. Pulmonary/Chest: Effort normal and breath sounds normal. No stridor. No respiratory distress. He has no decreased breath sounds. He has no wheezes. He has no rhonchi. He has no rales. He exhibits tenderness.  No hypoxia or tachypnea.    Abdominal: Soft. Bowel sounds are normal. He exhibits no distension. There is no tenderness. There is no rebound and no guarding.  Musculoskeletal: Normal range of motion.       Right lower leg: Normal.       Left lower leg: Normal.  No lower extremity edema or calf tenderness.  Lymphadenopathy:    He has no cervical adenopathy.  Neurological: He is alert and oriented to person, place, and time.  Skin: Skin is warm and dry. Capillary refill takes less than 2 seconds. He is not diaphoretic.  Psychiatric: His behavior is normal. Judgment and thought content normal.  Nursing note and vitals reviewed.    ED Treatments / Results  Labs (all labs ordered are listed, but only abnormal results are displayed) Labs Reviewed  BASIC METABOLIC PANEL - Abnormal; Notable for the following components:      Result Value   Glucose, Bld 141 (*)    All other components within normal limits  CBC - Abnormal; Notable for the following components:   Platelets 86 (*)    All other components within normal limits  I-STAT TROPONIN, ED  I-STAT TROPONIN, ED    EKG EKG Interpretation  Date/Time:  Monday April 05 2018 00:17:22 EDT Ventricular Rate:  97 PR Interval:  120 QRS Duration: 102 QT Interval:  340 QTC  Calculation: 431 R Axis:   85 Text Interpretation:  Normal sinus rhythm Minimal voltage criteria for LVH, may be normal variant T wave abnormality, consider inferior ischemia Abnormal ECG Confirmed by Zadie RhineWickline, Donald ([id number]) on 04/05/2018 4:49:05 AM   Radiology Dg Chest 2 View  Result Date: 04/05/2018 CLINICAL DATA:  Constant chest pain starting 2 days ago. EXAM: CHEST - 2 VIEW COMPARISON:  03/13/2018 FINDINGS: The heart size and mediastinal contours are within normal limits. Both lungs are clear. No pulmonary consolidation, effusion or pneumothorax. The visualized skeletal structures are unremarkable. IMPRESSION: No active cardiopulmonary disease. Electronically Signed   By: Tollie Ethavid  Kwon M.D.   On: 04/05/2018 01:06    Procedures Procedures (including critical care time)  Medications Ordered in ED Medications  gi cocktail (Maalox,Lidocaine,Donnatal) (30 mLs Oral Given 04/05/18 0500)     Initial Impression / Assessment and Plan / ED Course  I have reviewed the triage vital signs and the nursing notes.  Pertinent labs & imaging results that were available during my care of the patient were reviewed by me and considered in my medical decision making (see chart for details).     Pt presents to the Ed today with complaints of cp. Patient is to be discharged with recommendation to follow up with PCP in regards to today's hospital visit. Chest pain is not likely of cardiac or pulmonary etiology d/t presentation, perc negative, VSS, no tracheal deviation, no JVD or new murmur, RRR, breath sounds equal bilaterally, EKG without any change from prior tracing and shows no signs of acute ischemia, negative delta troponin, and negative CXR.  No leukocytosis.  Electrolytes are reassuring.  Patient does have a thrombocytopenia with his platelet is 86.  Patient has history of ITP and this is actually improved from prior.  Patient denies any oozing or bleeding.  Patient has a history of hypertension and is  hypertensive in the ED.  Patient is not currently on medications.  Instructed patient to monitor his blood pressure and follow-up with primary care doctor.  She has no signs of endorgan damage.  Pain is reproducible on palpation.  Seems more musculoskeletal in nature.  Patient reports history of same.  Pain improved with GI cocktail.  Will give patient lidocaine patch, Robaxin and instructed NSAIDs.  Pt has been advised to return to the ED is CP becomes exertional, associated with diaphoresis or nausea, radiates to left jaw/arm, worsens or becomes concerning in any way. Pt appears reliable for follow up and is agreeable to discharge.   Discussed with Dr. Bebe Shaggy who is agreeable the above plan.    Final Clinical Impressions(s) / ED Diagnoses   Final diagnoses:  Chest wall pain    ED Discharge Orders        Ordered    methocarbamol (ROBAXIN) 500 MG tablet  2 times daily     04/05/18 0555    lidocaine (LIDODERM) 5 %  Every 24 hours     04/05/18 0555       Rise Mu, PA-C 04/05/18 8119    Zadie Rhine, MD 04/05/18 [id number]

## 2018-04-05 NOTE — ED Notes (Signed)
Pt here for burning type chest pain since earlier yesterday morning.

## 2018-04-05 NOTE — Discharge Instructions (Addendum)
Your workup has been very reassuring in the ED.  Your symptoms seem consistent with muscular skeletal pain.  Have given you a lidocaine patch to apply to the area of most pain.  We will also take anti-inflammatory such as Motrin or ibuprofen. Please the the robaxin for muscle relaxation. This medication will make you drowsy so avoid situation that could place you in danger.  Warm compresses to the affected area.  Follow-up with primary care doctor.  Return to ED if worsening symptoms.

## 2018-04-05 NOTE — ED Triage Notes (Signed)
Patient c/o constant CP that began two days ago; denies SOB.

## 2018-08-22 ENCOUNTER — Encounter (HOSPITAL_COMMUNITY): Payer: Self-pay | Admitting: *Deleted

## 2018-08-22 ENCOUNTER — Emergency Department (HOSPITAL_COMMUNITY)
Admission: EM | Admit: 2018-08-22 | Discharge: 2018-08-22 | Disposition: A | Discharge status: Home / Self Care | Payer: Self-pay | Attending: Emergency Medicine | Admitting: Emergency Medicine

## 2018-08-22 ENCOUNTER — Other Ambulatory Visit: Payer: Self-pay

## 2018-08-22 ENCOUNTER — Emergency Department (HOSPITAL_COMMUNITY): Payer: Self-pay

## 2018-08-22 DIAGNOSIS — I10 Essential (primary) hypertension: Secondary | ICD-10-CM | POA: Insufficient documentation

## 2018-08-22 DIAGNOSIS — R0789 Other chest pain: Secondary | ICD-10-CM

## 2018-08-22 DIAGNOSIS — R079 Chest pain, unspecified: Secondary | ICD-10-CM | POA: Insufficient documentation

## 2018-08-22 DIAGNOSIS — Z79899 Other long term (current) drug therapy: Secondary | ICD-10-CM | POA: Insufficient documentation

## 2018-08-22 DIAGNOSIS — F17210 Nicotine dependence, cigarettes, uncomplicated: Secondary | ICD-10-CM | POA: Insufficient documentation

## 2018-08-22 DIAGNOSIS — R072 Precordial pain: Secondary | ICD-10-CM

## 2018-08-22 LAB — CBC
HCT: 47.1 % (ref 39.0–52.0)
HEMOGLOBIN: 15.2 g/dL (ref 13.0–17.0)
MCH: 29.3 pg (ref 26.0–34.0)
MCHC: 32.3 g/dL (ref 30.0–36.0)
MCV: 90.9 fL (ref 78.0–100.0)
PLATELETS: 87 10*3/uL — AB (ref 150–400)
RBC: 5.18 MIL/uL (ref 4.22–5.81)
RDW: 13.6 % (ref 11.5–15.5)
WBC: 7.2 10*3/uL (ref 4.0–10.5)

## 2018-08-22 LAB — BASIC METABOLIC PANEL
Anion gap: 12 (ref 5–15)
BUN: 17 mg/dL (ref 6–20)
CO2: 26 mmol/L (ref 22–32)
CREATININE: 0.96 mg/dL (ref 0.61–1.24)
Calcium: 9.5 mg/dL (ref 8.9–10.3)
Chloride: 103 mmol/L (ref 98–111)
GFR calc non Af Amer: >60 mL/min (ref >60)
Glucose, Bld: 94 mg/dL (ref 70–99)
Potassium: 3.6 mmol/L (ref 3.5–5.1)
Sodium: 141 mmol/L (ref 135–145)

## 2018-08-22 LAB — I-STAT TROPONIN, ED
TROPONIN I, POC: 0.02 ng/mL (ref 0.00–0.08)
Troponin i, poc: 0.03 ng/mL (ref 0.00–0.08)

## 2018-08-22 MED ORDER — IBUPROFEN 400 MG PO TABS
600.0000 mg | ORAL_TABLET | Freq: Once | ORAL | Status: AC
  Administered 2018-08-22: 600 mg via ORAL
  Filled 2018-08-22: qty 1

## 2018-08-22 MED ORDER — METHOCARBAMOL 500 MG PO TABS: 1000.0000 mg | ORAL_TABLET | Freq: Three times a day (TID) | ORAL | 0 refills | Status: DC | PRN

## 2018-08-22 NOTE — ED Provider Notes (Addendum)
[facility] EMERGENCY DEPARTMENT Provider Note   CSN: [id number] Arrival date & time: 08/22/18  1922     History   Chief Complaint Chief Complaint  Patient presents with  . Chest Pain    HPI Jeremiah Curtis is a 37 y.o. male.  Patient c/o mid to left chest pain, onset at rest, earlier today. Pain constant, dull, mild-mod, non radiating, persistent. Onset at rest, no relation to activity or exertion. No pleuritic pain. No associated sob, nv or diaphoresis. No fever or chills. Denies cough or uri symptoms. No chest wall injury or strain. No heartburn. No leg pain or swelling. No hx dvt or pe. Denies fam hx premature cad.   The history is provided by the patient.  Chest Pain   Pertinent negatives include no abdominal pain, no fever, no headaches, no palpitations, no shortness of breath and no vomiting.    Past Medical History:  Diagnosis Date  . Back symptoms, other   . Hypertension   . Idiopathic thrombocytopenic purpura (ITP) (HCC)     There are no active problems to display for this patient.   Past Surgical History:  Procedure Laterality Date  . BACK SURGERY          Home Medications    Prior to Admission medications   Medication Sig Start Date End Date Taking? Authorizing Provider  ciprofloxacin (CIPRO) 500 MG tablet Take 1 tablet (500 mg total) by mouth 2 (two) times daily. Patient not taking: Reported on 03/21/2018 02/27/14   Ivery Quale, PA-C  diclofenac (VOLTAREN) 75 MG EC tablet Take 1 tablet (75 mg total) by mouth 2 (two) times daily. Patient not taking: Reported on 03/21/2018 02/27/14   Ivery Quale, PA-C  doxycycline (VIBRAMYCIN) 100 MG capsule Take 1 capsule (100 mg total) by mouth 2 (two) times daily. Patient not taking: Reported on 04/05/2018 03/21/18   Tilden Fossa, MD  HYDROcodone-acetaminophen [facility]) 5-325 MG per tablet Take 1-2 tablets by mouth every 6 (six) hours as needed for moderate pain. Patient not taking: Reported on  03/21/2018 02/16/14   Arthor Captain, PA-C  lidocaine (LIDODERM) 5 % Place 1 patch onto the skin daily. Remove & Discard patch within 12 hours or as directed by MD 04/05/18   Rise Mu, PA-C  methocarbamol (ROBAXIN) 500 MG tablet Take 1 tablet (500 mg total) by mouth 2 (two) times daily. 04/05/18   Rise Mu, PA-C  naproxen (NAPROSYN) 500 MG tablet Take 1 tablet (500 mg total) by mouth 2 (two) times daily as needed. Patient not taking: Reported on 03/21/2018 03/14/18   Fayrene Helper, PA-C    Family History No family history on file.  Social History Social History   Tobacco Use  . Smoking status: Current Every Day Smoker    Packs/day: 0.50    Types: Cigarettes  . Smokeless tobacco: Never Used  Substance Use Topics  . Alcohol use: Yes    Frequency: Never  . Drug use: No     Allergies   Fish allergy   Review of Systems Review of Systems  Constitutional: Negative for fever.  HENT: Negative for sore throat.   Eyes: Negative for redness.  Respiratory: Negative for shortness of breath.   Cardiovascular: Positive for chest pain. Negative for palpitations and leg swelling.  Gastrointestinal: Negative for abdominal pain and vomiting.  Genitourinary: Negative for flank pain.  Musculoskeletal: Negative for neck pain.  Skin: Negative for rash.  Neurological: Negative for headaches.  Hematological: Does not bruise/bleed easily.  Psychiatric/Behavioral: Negative for confusion.     Physical Exam Updated Vital Signs BP 138/86 (BP Location: Right Arm)   Pulse 67   Temp 98.7 F (37.1 C)   Resp (!) 25   SpO2 99%   Physical Exam  Constitutional: He appears well-developed and well-nourished.  HENT:  Mouth/Throat: Oropharynx is clear and moist.  Eyes: Conjunctivae are normal.  Neck: Neck supple. No tracheal deviation present.  Cardiovascular: Normal rate, regular rhythm, normal heart sounds and intact distal pulses. Exam reveals no gallop and no friction rub.  No  murmur heard. Pulmonary/Chest: Effort normal and breath sounds normal. No accessory muscle usage. No respiratory distress. He exhibits tenderness.  Abdominal: Soft. Bowel sounds are normal. He exhibits no distension. There is no tenderness.  Musculoskeletal: He exhibits no edema or tenderness.  Neurological: He is alert.  Speech normal. Steady gait.   Skin: Skin is warm and dry. No rash noted.  Psychiatric: He has a normal mood and affect.  Nursing note and vitals reviewed.    ED Treatments / Results  Labs (all labs ordered are listed, but only abnormal results are displayed) Results for orders placed or performed during the hospital encounter of 08/22/18  Basic metabolic panel  Result Value Ref Range   Sodium 141 135 - 145 mmol/L   Potassium 3.6 3.5 - 5.1 mmol/L   Chloride 103 98 - 111 mmol/L   CO2 26 22 - 32 mmol/L   Glucose, Bld 94 70 - 99 mg/dL   BUN 17 6 - 20 mg/dL   Creatinine, Ser 1.61 0.61 - 1.24 mg/dL   Calcium 9.5 8.9 - 09.6 mg/dL   GFR calc non Af Amer >60 >60 mL/min   GFR calc Af Amer >60 >60 mL/min   Anion gap 12 5 - 15  CBC  Result Value Ref Range   WBC 7.2 4.0 - 10.5 K/uL   RBC 5.18 4.22 - 5.81 MIL/uL   Hemoglobin 15.2 13.0 - 17.0 g/dL   HCT 04.5 40.9 - 81.1 %   MCV 90.9 78.0 - 100.0 fL   MCH 29.3 26.0 - 34.0 pg   MCHC 32.3 30.0 - 36.0 g/dL   RDW 91.4 78.2 - 95.6 %   Platelets 87 (L) 150 - 400 K/uL  I-stat troponin, ED  Result Value Ref Range   Troponin i, poc 0.03 0.00 - 0.08 ng/mL   Comment 3           Dg Chest 2 View  Result Date: 08/22/2018 CLINICAL DATA:  Chest pain, shortness of breath EXAM: CHEST - 2 VIEW COMPARISON:  04/05/2018 FINDINGS: Heart and mediastinal contours are within normal limits. No focal opacities or effusions. No acute bony abnormality. IMPRESSION: No active cardiopulmonary disease. Electronically Signed   By: Charlett Nose M.D.   On: 08/22/2018 20:01    EKG EKG Interpretation  Date/Time:  Sunday August 22 2018 19:35:04  EDT Ventricular Rate:  73 PR Interval:  130 QRS Duration: 100 QT Interval:  354 QTC Calculation: 389 R Axis:   85 Text Interpretation:  Normal sinus rhythm with sinus arrhythmia Nonspecific ST abnormality Confirmed by Cathren Laine ([id number]) on 08/22/2018 8:40:29 PM   Radiology Dg Chest 2 View  Result Date: 08/22/2018 CLINICAL DATA:  Chest pain, shortness of breath EXAM: CHEST - 2 VIEW COMPARISON:  04/05/2018 FINDINGS: Heart and mediastinal contours are within normal limits. No focal opacities or effusions. No acute bony abnormality. IMPRESSION: No active cardiopulmonary disease. Electronically Signed   By: Charlett Nose  M.D.   On: 08/22/2018 20:01    Procedures Procedures (including critical care time)  Medications Ordered in ED Medications  ibuprofen (ADVIL,MOTRIN) tablet 600 mg (has no administration in time range)     Initial Impression / Assessment and Plan / ED Course  I have reviewed the triage vital signs and the nursing notes.  Pertinent labs & imaging results that were available during my care of the patient were reviewed by me and considered in my medical decision making (see chart for details).  Labs. Cxr. Ecg.  Reviewed nursing notes and prior charts for additional history.   Labs reviewed - initial trop normal.   cxr reviewed - no pna.   +chest wall tenderness on exam. Motrin po.  Delta/repeat trop normal.   Pt also notes low back pain, hx chronic low back pain, remote hx back surgery. Denies recent injury. TLS spine non tender, aligned, no step off. No radicular pain. No numbness/weakness. Pt requests rx for something he could take at home.   Pt currently appears stable for d/c.     Final Clinical Impressions(s) / ED Diagnoses   Final diagnoses:  None    ED Discharge Orders    None         Cathren LaineSteinl, Kevork Joyce, MD 08/22/18 [phone]

## 2018-08-22 NOTE — Discharge Instructions (Addendum)
It was our pleasure to provide your ER care today - we hope that you feel better.  For back, take acetaminophen and/or ibuprofen as need. You may also take robaxin as need for muscle pain/spasm - no driving when taking. Follow up with primary care doctor.   Follow up with primary care doctor in the coming week.  Return to ER if worse, new symptoms, recurrent or persistent chest pain, trouble breathing, other concern.

## 2018-08-22 NOTE — ED Triage Notes (Addendum)
Pt was driving in his car today felt lightheaded with L and central chest pain with SOB. Hx of ITP and HTN, denies recent long distance travel

## 2018-09-03 ENCOUNTER — Emergency Department (HOSPITAL_COMMUNITY)
Admission: EM | Admit: 2018-09-03 | Discharge: 2018-09-03 | Disposition: A | Discharge status: Home / Self Care | Payer: Self-pay | Attending: Emergency Medicine | Admitting: Emergency Medicine

## 2018-09-03 ENCOUNTER — Encounter (HOSPITAL_COMMUNITY): Payer: Self-pay

## 2018-09-03 ENCOUNTER — Other Ambulatory Visit: Payer: Self-pay

## 2018-09-03 ENCOUNTER — Emergency Department (HOSPITAL_COMMUNITY): Payer: Self-pay

## 2018-09-03 DIAGNOSIS — M48062 Spinal stenosis, lumbar region with neurogenic claudication: Secondary | ICD-10-CM | POA: Insufficient documentation

## 2018-09-03 DIAGNOSIS — G8929 Other chronic pain: Secondary | ICD-10-CM | POA: Insufficient documentation

## 2018-09-03 DIAGNOSIS — F17210 Nicotine dependence, cigarettes, uncomplicated: Secondary | ICD-10-CM | POA: Insufficient documentation

## 2018-09-03 DIAGNOSIS — I10 Essential (primary) hypertension: Secondary | ICD-10-CM | POA: Insufficient documentation

## 2018-09-03 DIAGNOSIS — Z79899 Other long term (current) drug therapy: Secondary | ICD-10-CM | POA: Insufficient documentation

## 2018-09-03 MED ORDER — CYCLOBENZAPRINE HCL 10 MG PO TABS
10.0000 mg | ORAL_TABLET | Freq: Once | ORAL | Status: AC
  Administered 2018-09-03: 10 mg via ORAL
  Filled 2018-09-03: qty 1

## 2018-09-03 MED ORDER — CYCLOBENZAPRINE HCL 10 MG PO TABS: 10.0000 mg | ORAL_TABLET | Freq: Two times a day (BID) | ORAL | 0 refills | Status: DC | PRN

## 2018-09-03 MED ORDER — PREDNISONE 10 MG (21) PO TBPK: ORAL_TABLET | Freq: Every day | ORAL | 0 refills | Status: DC

## 2018-09-03 MED ORDER — OXYCODONE-ACETAMINOPHEN 5-325 MG PO TABS: 1.0000 | ORAL_TABLET | ORAL | 0 refills | Status: DC | PRN

## 2018-09-03 MED ORDER — METHYLPREDNISOLONE SODIUM SUCC 125 MG IJ SOLR
60.0000 mg | Freq: Once | INTRAMUSCULAR | Status: AC
  Administered 2018-09-03: 60 mg via INTRAVENOUS
  Filled 2018-09-03: qty 2

## 2018-09-03 MED ORDER — OXYCODONE-ACETAMINOPHEN 5-325 MG PO TABS
1.0000 | ORAL_TABLET | Freq: Once | ORAL | Status: DC
  Filled 2018-09-03: qty 1

## 2018-09-03 NOTE — ED Notes (Signed)
Patient verbalizes understanding of discharge instructions. Opportunity for questioning and answers were provided. Armband removed by staff, pt discharged from ED.  

## 2018-09-03 NOTE — ED Triage Notes (Signed)
Pt endorses 8/10 left leg pain that started x3 days ago. Pt states it feels like his leg is "on fire". Pt states he has a pinched nerve in his back. Pt states after his back surgery in 2010 he has had trouble with his leg, states normally when he has a flare up he gets "a shot in my leg and it helps for a little bit but then it wears off".

## 2018-09-03 NOTE — ED Provider Notes (Signed)
[facility] EMERGENCY DEPARTMENT Provider Note   CSN: [id number] Arrival date & time: 09/03/18  0913     History   Chief Complaint Chief Complaint  Patient presents with  . Leg Pain    HPI Jeremiah Curtis is a 37 y.o. male with a h/o of ITP, HTN, and chronic low back pain s/p back surgery who presents to the emergency department with a chief complaint of back pain that radiates down the left leg.  He reports the pain began 2 weeks ago, but has severely worsened in the last 3 to 4 days.  He characterizes the pain as a burning pain, 8/10, and it feels as if his entire left leg is "one fire".  The pain is worse over the lateral aspect of his leg.  Walking makes his pain worse, typically the same distance each time, from his bedroom to his bathroom.  Pain does not resolved, but is improved with rest.  He denies saddle paresthesias, urinary or fecal incontinence, fever, chills, or weakness.  Patient states that he had low back surgery in 2013 by Dr. Danielle Dess for " two slipped disc and a pinched nerve" after he had had an injury at work.  He reports that he has not followed up with Dr. Danielle Dess because he does not currently have medical insurance and the surgery was only covered because it was worker's Comp.  Reports that he has had some mild, intermittent shortness of breath, that is typically worse when he does not use his CPAP at night.  Denies recent long travel, surgery, hormone therapy, or family history of DVT or PE.  The history is provided by the patient. No language interpreter was used.    Past Medical History:  Diagnosis Date  . Back symptoms, other   . Hypertension   . Idiopathic thrombocytopenic purpura (ITP) (HCC)     There are no active problems to display for this patient.   Past Surgical History:  Procedure Laterality Date  . BACK SURGERY          Home Medications    Prior to Admission medications   Medication Sig Start Date End Date  Taking? Authorizing Provider  cyclobenzaprine (FLEXERIL) 10 MG tablet Take 1 tablet (10 mg total) by mouth 2 (two) times daily as needed for muscle spasms. 09/03/18   McDonald, Mia A, PA-C  oxyCODONE-acetaminophen (PERCOCET/ROXICET) 5-325 MG tablet Take 1 tablet by mouth every 4 (four) hours as needed for severe pain. 09/03/18   McDonald, Mia A, PA-C  predniSONE (STERAPRED UNI-PAK 21 TAB) 10 MG (21) TBPK tablet Take by mouth daily. Take 6 tabs by mouth daily  for 2 days, then 5 tabs for 2 days, then 4 tabs for 2 days, then 3 tabs for 2 days, 2 tabs for 2 days, then 1 tab by mouth daily for 2 days 09/03/18   Frederik Pear A, PA-C    Family History No family history on file.  Social History Social History   Tobacco Use  . Smoking status: Current Every Day Smoker    Packs/day: 0.50    Types: Cigarettes  . Smokeless tobacco: Never Used  Substance Use Topics  . Alcohol use: Yes    Frequency: Never  . Drug use: No     Allergies   Fish allergy   Review of Systems Review of Systems  Constitutional: Negative for appetite change, chills and fever.  Respiratory: Negative for shortness of breath.   Cardiovascular: Negative for chest pain.  Gastrointestinal:  Negative for abdominal pain.  Genitourinary: Negative for dysuria.  Musculoskeletal: Positive for arthralgias, back pain, gait problem and myalgias. Negative for neck pain and neck stiffness.  Skin: Negative for rash.  Allergic/Immunologic: Negative for immunocompromised state.  Neurological: Negative for dizziness, syncope, weakness, numbness and headaches.       Paresthesias  Psychiatric/Behavioral: Negative for confusion.     Physical Exam Updated Vital Signs BP 129/81   Pulse (!) 55   Temp 98.1 F (36.7 C) (Oral)   Resp 16   Ht 5\' 5"  (1.651 m)   Wt 74.8 kg   SpO2 98%   BMI 27.46 kg/m   Physical Exam  Constitutional: He appears well-developed.  HENT:  Head: Normocephalic.  Eyes: Conjunctivae are normal.  Neck: Neck  supple.  Cardiovascular: Normal rate, regular rhythm, normal heart sounds and intact distal pulses. Exam reveals no gallop and no friction rub.  No murmur heard. Pulmonary/Chest: Effort normal. No stridor. No respiratory distress. He has no wheezes. He has no rales. He exhibits no tenderness.  Abdominal: Soft. He exhibits no distension.  Musculoskeletal:  Tender to palpation to the spinous processes of the lumbar spine.  No paraspinal muscle tenderness bilaterally.  Thoracic and cervical spine are nontender.  Positive straight leg raise.  Negative straight leg raise on the right.  Decreased sensation to sharp and light touch along the peroneal nerve distribution on the left.  5 out of 5 strength against resistance of the bilateral lower extremities. DP pulses are 2+ and symmetric.  Patient is able to move independently from laying to sitting to ambulating with minimal difficulty.  Neurological: He is alert.  Skin: Skin is warm and dry.  Psychiatric: His behavior is normal.  Nursing note and vitals reviewed.    ED Treatments / Results  Labs (all labs ordered are listed, but only abnormal results are displayed) Labs Reviewed - No data to display  EKG None  Radiology Dg Lumbar Spine Complete  Result Date: 09/03/2018 CLINICAL DATA:  Acute on chronic low back pain. The pain radiates down the left leg. EXAM: LUMBAR SPINE - COMPLETE 4+ VIEW COMPARISON:  Radiographs dated 01/27/2013 and MRI dated 02/24/2013 FINDINGS: There is slight disc space narrowing at L4-5 and L5-S1. No spondylolisthesis. No appreciable facet arthritis. Sacroiliac joints appear normal. IMPRESSION: Chronic degenerative disc disease at L4-5 and L5-S1, as indicated by slight disc space narrowing. Prior MRI of the lumbar spine in 2014 did demonstrate disc protrusions at L4-5 and L5-S1. Electronically Signed   By: Francene Boyers M.D.   On: 09/03/2018 11:57    Procedures Procedures (including critical care time)  Medications  Ordered in ED Medications  oxyCODONE-acetaminophen (PERCOCET/ROXICET) 5-325 MG per tablet 1 tablet (1 tablet Oral Refused 09/03/18 1206)  methylPREDNISolone sodium succinate (SOLU-MEDROL) 125 mg/2 mL injection 60 mg (60 mg Intravenous Given 09/03/18 1209)  cyclobenzaprine (FLEXERIL) tablet 10 mg (10 mg Oral Given 09/03/18 1204)     Initial Impression / Assessment and Plan / ED Course  I have reviewed the triage vital signs and the nursing notes.  Pertinent labs & imaging results that were available during my care of the patient were reviewed by me and considered in my medical decision making (see chart for details).     37 year old male with a history of chronic low back pain status post back surgery, ITP, and hypertension presenting with acute on chronic low back pain.  The patient has also developed some mild neurogenic claudication symptoms over the last 3 days, but  denies saddle paresthesias, urinary or fecal incontinence, or weakness.  On exam, he has decreased sensation to sharp and light touch over the sciatic and peroneal nerve distribution on the left.  He is able to change positions and ambulate with minimal difficulty.  He underwent back surgery of the low back with Dr. Danielle Dess several years ago, but has not since followed up.  Lumbar spine x-ray with chronic degenerative disease at L4-L5 and L5-S1 with slight disc space narrowing.  He last had an MRI of the lumbar spine in 2014 which did demonstrate some disc protrusions in the spaces.  On reevaluation after the patient was given pain control, he reported significant improvement in his symptoms.  Shared decision making conversation in the ED, and the patient and his significant other felt comfortable with discharge to home with pain control and steroid taper and to follow-up with neurosurgery in the outpatient clinic.  He was given strict return precautions to return to the emergency department if you develop new symptoms, including urinary or  fecal incontinence, worsening weakness, or saddle paresthesias.  He was ambulatory at discharge and in no acute distress.  He is hemodynamically stable and safe for discharge at this time.  Final Clinical Impressions(s) / ED Diagnoses   Final diagnoses:  Spinal stenosis of lumbar region with neurogenic claudication    ED Discharge Orders         Ordered    predniSONE (STERAPRED UNI-PAK 21 TAB) 10 MG (21) TBPK tablet  Daily     09/03/18 1247    cyclobenzaprine (FLEXERIL) 10 MG tablet  2 times daily PRN     09/03/18 1247    oxyCODONE-acetaminophen (PERCOCET/ROXICET) 5-325 MG tablet  Every 4 hours PRN     09/03/18 1247           McDonald, Mia A, PA-C 09/03/18 1359    Tilden Fossa, MD 09/03/18 1722

## 2018-09-03 NOTE — Discharge Instructions (Addendum)
Thank you for allowing me to care for you today in the Emergency Department.   Please call and schedule follow-up appointment with [place] neurosurgery.  Take 650 mg of Tylenol every 6 hours for pain control. You can also take one tablet of Flexeril for muscle pain or spasms two times per day. For severe, uncontrollable pain, take one tablet of Percocet, but this should only be taken for severe pain.  Also apply a topical cream such as Lidocaine, is available over-the-counter.  Return to the emergency department if you develop new or worsening symptoms including if he start peeing or pooping on yourself, if you develop numbness around the rectum, high fever, or if your leg gets very weak, or if you develop other new, concerning symptoms.

## 2018-09-03 NOTE — ED Notes (Signed)
pt refused and percocet was not returned prior to pt discharge from facility. Wasted percocet in sharps with [place] Grose RN.

## 2018-11-05 ENCOUNTER — Encounter (HOSPITAL_COMMUNITY): Payer: Self-pay | Admitting: Family Medicine

## 2018-11-05 ENCOUNTER — Other Ambulatory Visit: Payer: Self-pay

## 2018-11-05 ENCOUNTER — Ambulatory Visit (HOSPITAL_COMMUNITY)
Admission: EM | Admit: 2018-11-05 | Discharge: 2018-11-05 | Disposition: A | Discharge status: Home / Self Care | Payer: Self-pay | Attending: Family Medicine | Admitting: Family Medicine

## 2018-11-05 DIAGNOSIS — I10 Essential (primary) hypertension: Secondary | ICD-10-CM

## 2018-11-05 MED ORDER — AMLODIPINE BESYLATE 5 MG PO TABS: 5.0000 mg | ORAL_TABLET | Freq: Every day | ORAL | 5 refills | Status: DC

## 2018-11-05 NOTE — ED Provider Notes (Signed)
[facility]    CSN: [id number] Arrival date & time: 11/05/18  1816     History   Chief Complaint Chief Complaint  Patient presents with  . Hypertension    HPI Jeremiah Curtis is a 37 y.o. male.   37 year old man here for evaluation of hypertension.  He has not been seen here in over 5 years.  Patient reports breaking out in sweat.  Body aches, drowsy, no nausea, no vomiting.  Reports headache all day.  175/110 per patient using portable monitor at home.   Patient is a smoker.  He has a history of hypertension but was taken off his medicines in the past because he had side effects.  At one point he was taking 3 different drugs.     Past Medical History:  Diagnosis Date  . Back symptoms, other   . Hypertension   . Idiopathic thrombocytopenic purpura (ITP) (HCC)     There are no active problems to display for this patient.   Past Surgical History:  Procedure Laterality Date  . BACK SURGERY         Home Medications    Prior to Admission medications   Medication Sig Start Date End Date Taking? Authorizing Provider  amLODipine (NORVASC) 5 MG tablet Take 1 tablet (5 mg total) by mouth daily. 11/05/18   Elvina Sidle, MD    Family History Family History  Problem Relation Age of Onset  . Hypertension Mother   . Hypertension Father     Social History Social History   Tobacco Use  . Smoking status: Current Every Day Smoker    Packs/day: 0.50    Types: Cigarettes  . Smokeless tobacco: Never Used  Substance Use Topics  . Alcohol use: Yes    Frequency: Never  . Drug use: No     Allergies   Fish allergy   Review of Systems Review of Systems   Physical Exam Triage Vital Signs ED Triage Vitals  Enc Vitals Group     BP 11/05/18 1857 (!) 146/76     Pulse Rate 11/05/18 1857 82     Resp 11/05/18 1857 18     Temp 11/05/18 1857 98.4 F (36.9 C)     Temp Source 11/05/18 1857 Oral     SpO2 11/05/18 1857 100 %     Weight --    Height --      Head Circumference --      Peak Flow --      Pain Score 11/05/18 1855 8     Pain Loc --      Pain Edu? --      Excl. in GC? --    No data found.  Updated Vital Signs BP (!) 146/76 (BP Location: Left Arm)   Pulse 82   Temp 98.4 F (36.9 C) (Oral)   Resp 18   SpO2 100%   Visual Acuity Right Eye Distance:   Left Eye Distance:   Bilateral Distance:    Right Eye Near:   Left Eye Near:    Bilateral Near:     Physical Exam  Constitutional: He is oriented to person, place, and time. He appears well-developed and well-nourished.  HENT:  Right Ear: External ear normal.  Left Ear: External ear normal.  Mouth/Throat: Oropharynx is clear and moist.  Eyes: Pupils are equal, round, and reactive to light. Conjunctivae and EOM are normal.  Neck: Normal range of motion. Neck supple.  Cardiovascular: Normal rate, regular rhythm and normal heart  sounds.  Pulmonary/Chest: Effort normal and breath sounds normal.  Musculoskeletal: Normal range of motion.  Neurological: He is alert and oriented to person, place, and time.  Skin: Skin is warm and dry.  Psychiatric: He has a normal mood and affect.  Nursing note and vitals reviewed.    UC Treatments / Results  Labs (all labs ordered are listed, but only abnormal results are displayed) Labs Reviewed - No data to display  EKG None  Radiology No results found.  Procedures Procedures (including critical care time)  Medications Ordered in UC Medications - No data to display  Initial Impression / Assessment and Plan / UC Course  I have reviewed the triage vital signs and the nursing notes.  Pertinent labs & imaging results that were available during my care of the patient were reviewed by me and considered in my medical decision making (see chart for details).    Final Clinical Impressions(s) / UC Diagnoses   Final diagnoses:  Essential hypertension   Discharge Instructions   None    ED Prescriptions     Medication Sig Dispense Auth. Provider   amLODipine (NORVASC) 5 MG tablet Take 1 tablet (5 mg total) by mouth daily. 30 tablet Elvina Sidle, MD     Controlled Substance Prescriptions [place] Controlled Substance Registry consulted? Not Applicable   Elvina Sidle, MD 11/05/18 [phone]

## 2018-11-05 NOTE — ED Triage Notes (Signed)
Patient reports breaking out in sweat.  Body aches, drowsy, no nausea, no vomiting.  Reports headache all day.  175/110 per patient using portable monitor at home.

## 2018-11-30 ENCOUNTER — Encounter (HOSPITAL_COMMUNITY): Payer: Self-pay | Admitting: Emergency Medicine

## 2018-11-30 ENCOUNTER — Ambulatory Visit (HOSPITAL_COMMUNITY)
Admission: EM | Admit: 2018-11-30 | Discharge: 2018-11-30 | Disposition: A | Discharge status: Home / Self Care | Payer: Self-pay | Attending: Family Medicine | Admitting: Family Medicine

## 2018-11-30 DIAGNOSIS — M62838 Other muscle spasm: Secondary | ICD-10-CM

## 2018-11-30 DIAGNOSIS — R252 Cramp and spasm: Secondary | ICD-10-CM

## 2018-11-30 MED ORDER — CYCLOBENZAPRINE HCL 10 MG PO TABS: 10.0000 mg | ORAL_TABLET | Freq: Three times a day (TID) | ORAL | 0 refills | Status: DC

## 2018-11-30 MED ORDER — PREDNISONE 10 MG (21) PO TBPK: ORAL_TABLET | Freq: Every day | ORAL | 0 refills | Status: DC

## 2018-11-30 NOTE — Discharge Instructions (Addendum)
Be aware, muscle relaxing medications may cause drowsiness. Please do not drive, operate heavy machinery or make important decisions while on this medication, it can cloud your judgement. ° °

## 2018-11-30 NOTE — ED Triage Notes (Signed)
Pt states his whole left leg has been "stiffening up" for several months. Pt states he had spinal/nerve surgery in his L5 years ago, states he had nerve damage. Pt states ever since then hes had problems with his L leg.

## 2018-12-15 NOTE — ED Provider Notes (Signed)
[facility]   [id number] 11/30/18 Arrival Time: 1446  ASSESSMENT & PLAN:  1. Leg cramping   2. Muscle spasm of left lower extremity    Meds ordered this encounter  Medications  . predniSONE (STERAPRED UNI-PAK 21 TAB) 10 MG (21) TBPK tablet    Sig: Take by mouth daily. Take as directed.    Dispense:  21 tablet    Refill:  0  . cyclobenzaprine (FLEXERIL) 10 MG tablet    Sig: Take 1 tablet (10 mg total) by mouth 3 (three) times daily.    Dispense:  20 tablet    Refill:  0   Medication sedation precautions. Encouraged stretching and mobility. To start taking medications above. Recommend f/u with orthopaedist if not seeing improvement over the next week. No neurological abnormalities on exam today. Discussed.  May f/u here as needed. Reviewed expectations re: course of current medical issues. Questions answered. Outlined signs and symptoms indicating need for more acute intervention. Patient verbalized understanding. After Visit Summary given.  SUBJECTIVE: History from: patient. Jeremiah Curtis is a 37 y.o. male who reports "leg cramping". Left. "Has been stiffening up on and off over the past several months." Reports h/o spinal surgery; "maybe L5" several years ago "with nerve damage". Ambulatory without difficulty. Describes cramping sensation; more at night; affecting sleep. No extremity sensation changes or weakness. No change in bowel/bladder habits. No OTC treatment. No specific aggravating or alleviating factors reported. No bak pain reported.  Past Surgical History:  Procedure Laterality Date  . BACK SURGERY      ROS: As per HPI.   OBJECTIVE:  Vitals:   11/30/18 1517  BP: 133/79  Pulse: 83  Resp: 16  Temp: 98.1 F (36.7 C)  SpO2: 100%    General appearance: alert; no distress HEENT: [place]; AT Neck: FROM; supple CV: RRR Lungs: CTAB Extremities: . lower: warm and well perfused; without gross deformities; with no swelling; with no bruising; ROM:  normal; bilateral SLR negative CV: brisk extremity capillary refill of RLE and LLE; 2+ DP and PT pulse of RLE and LLE. Skin: warm and dry; no visible rashes Neurologic: gait normal; normal reflexes of RLE and LLE; normal sensation of RLE and LLE; normal strength of RLE and LLE Psychological: alert and cooperative; normal mood and affect  Allergies  Allergen Reactions  . Fish Allergy Anaphylaxis    Past Medical History:  Diagnosis Date  . Back symptoms, other   . Hypertension   . Idiopathic thrombocytopenic purpura (ITP) (HCC)    Social History   Socioeconomic History  . Marital status: Married    Spouse name: Not on file  . Number of children: Not on file  . Years of education: Not on file  . Highest education level: Not on file  Occupational History  . Not on file  Social Needs  . Financial resource strain: Not on file  . Food insecurity:    Worry: Not on file    Inability: Not on file  . Transportation needs:    Medical: Not on file    Non-medical: Not on file  Tobacco Use  . Smoking status: Current Every Day Smoker    Packs/day: 0.50    Types: Cigarettes  . Smokeless tobacco: Never Used  Substance and Sexual Activity  . Alcohol use: Yes    Frequency: Never  . Drug use: No  . Sexual activity: Yes  Lifestyle  . Physical activity:    Days per week: Not on file  Minutes per session: Not on file  . Stress: Not on file  Relationships  . Social connections:    Talks on phone: Not on file    Gets together: Not on file    Attends religious service: Not on file    Active member of club or organization: Not on file    Attends meetings of clubs or organizations: Not on file    Relationship status: Not on file  Other Topics Concern  . Not on file  Social History Narrative  . Not on file   Family History  Problem Relation Age of Onset  . Hypertension Mother   . Hypertension Father    Past Surgical History:  Procedure Laterality Date  . BACK SURGERY         Mardella Layman, MD 12/18/18 [phone]

## 2019-02-14 ENCOUNTER — Emergency Department (HOSPITAL_COMMUNITY)
Admission: EM | Admit: 2019-02-14 | Discharge: 2019-02-14 | Disposition: A | Discharge status: Home / Self Care | Payer: Self-pay | Attending: Emergency Medicine | Admitting: Emergency Medicine

## 2019-02-14 ENCOUNTER — Encounter (HOSPITAL_COMMUNITY): Payer: Self-pay | Admitting: *Deleted

## 2019-02-14 DIAGNOSIS — I10 Essential (primary) hypertension: Secondary | ICD-10-CM | POA: Insufficient documentation

## 2019-02-14 DIAGNOSIS — Z79899 Other long term (current) drug therapy: Secondary | ICD-10-CM | POA: Insufficient documentation

## 2019-02-14 DIAGNOSIS — H02843 Edema of right eye, unspecified eyelid: Secondary | ICD-10-CM | POA: Insufficient documentation

## 2019-02-14 DIAGNOSIS — F17210 Nicotine dependence, cigarettes, uncomplicated: Secondary | ICD-10-CM | POA: Insufficient documentation

## 2019-02-14 MED ORDER — ERYTHROMYCIN 5 MG/GM OP OINT
1.0000 "application " | TOPICAL_OINTMENT | Freq: Once | OPHTHALMIC | Status: AC
  Administered 2019-02-14: 1 via OPHTHALMIC
  Filled 2019-02-14: qty 3.5

## 2019-02-14 MED ORDER — TETRACAINE HCL 0.5 % OP SOLN
2.0000 [drp] | Freq: Once | OPHTHALMIC | Status: AC
  Administered 2019-02-14: 2 [drp] via OPHTHALMIC
  Filled 2019-02-14: qty 4

## 2019-02-14 MED ORDER — FLUORESCEIN SODIUM 1 MG OP STRP
1.0000 | ORAL_STRIP | Freq: Once | OPHTHALMIC | Status: AC
  Administered 2019-02-14: 1 via OPHTHALMIC
  Filled 2019-02-14: qty 1

## 2019-02-14 NOTE — ED Triage Notes (Signed)
Pt in c/o swelling to his right upper eyelid, states it started as a stye and swelling worsened the next day, reports drainage from his eye as well

## 2019-02-14 NOTE — ED Provider Notes (Signed)
[facility] EMERGENCY DEPARTMENT Provider Note   CSN: [id number] Arrival date & time: 02/14/19  0754     History   Chief Complaint Chief Complaint  Patient presents with  . Eye Problem    HPI Tammer Nettles is a 38 y.o. male with past medical history of hypertension, presenting to the emergency department with complaint of left eye swelling that began on Friday.  Patient states initially had confined to his lower lid, however improved with warm compresses.  He states his swelling came and went throughout the weekend.  Yesterday he felt like he had a "rock" in his eye, however that sensation resolved.  He has had clear tearing, however no purulent drainage.  Denies any significant pain.  States initially his eyelid was itchy though that has resolved.  No headache, no photophobia, no pain with eye movement.  He is not a contact lens wearer.  The history is provided by the patient.    Past Medical History:  Diagnosis Date  . Back symptoms, other   . Hypertension   . Idiopathic thrombocytopenic purpura (ITP) (HCC)     There are no active problems to display for this patient.   Past Surgical History:  Procedure Laterality Date  . BACK SURGERY          Home Medications    Prior to Admission medications   Medication Sig Start Date End Date Taking? Authorizing Provider  amLODipine (NORVASC) 5 MG tablet Take 1 tablet (5 mg total) by mouth daily. 11/05/18   Elvina Sidle, MD  cyclobenzaprine (FLEXERIL) 10 MG tablet Take 1 tablet (10 mg total) by mouth 3 (three) times daily. 11/30/18   Mardella Layman, MD  predniSONE (STERAPRED UNI-PAK 21 TAB) 10 MG (21) TBPK tablet Take by mouth daily. Take as directed. 11/30/18   Mardella Layman, MD    Family History Family History  Problem Relation Age of Onset  . Hypertension Mother   . Hypertension Father     Social History Social History   Tobacco Use  . Smoking status: Current Every Day Smoker    Packs/day:  0.50    Types: Cigarettes  . Smokeless tobacco: Never Used  Substance Use Topics  . Alcohol use: Yes    Frequency: Never  . Drug use: No     Allergies   Fish allergy   Review of Systems Review of Systems  Eyes: Negative for photophobia, pain, redness and visual disturbance.  Neurological: Negative for headaches.     Physical Exam Updated Vital Signs BP (!) 165/98 (BP Location: Right Arm)   Pulse 80   Temp 98.6 F (37 C) (Oral)   Resp 18   SpO2 100%   Physical Exam Vitals signs and nursing note reviewed.  Constitutional:      General: He is not in acute distress.    Appearance: He is well-developed.  HENT:     Head: Normocephalic and atraumatic.  Eyes:     General:        Right eye: No foreign body.     Extraocular Movements: Extraocular movements intact.     Conjunctiva/sclera: Conjunctivae normal.     Pupils: Pupils are equal, round, and reactive to light.     Comments: Upper lid with swelling, no significant redness appreciated. Slight tenderness to the lateral aspect of the lid. No obvious stye appreciated. No tenderness surround orbit. No pain with EOM. No photophobia. No drainage.  Right eye visualized under Woods lamp with fluorescein stain, no appreciated  uptake.  No obvious foreign body.  Pulmonary:     Effort: Pulmonary effort is normal.  Neurological:     Mental Status: He is alert.  Psychiatric:        Mood and Affect: Mood normal.        Behavior: Behavior normal.      ED Treatments / Results  Labs (all labs ordered are listed, but only abnormal results are displayed) Labs Reviewed - No data to display  EKG None  Radiology No results found.  Procedures Procedures (including critical care time)  Medications Ordered in ED Medications  fluorescein ophthalmic strip 1 strip (1 strip Right Eye Given 02/14/19 0815)  tetracaine (PONTOCAINE) 0.5 % ophthalmic solution 2 drop (2 drops Right Eye Given 02/14/19 0815)  erythromycin ophthalmic  ointment 1 application (1 application Right Eye Given 02/14/19 0839)     Initial Impression / Assessment and Plan / ED Course  I have reviewed the triage vital signs and the nursing notes.  Pertinent labs & imaging results that were available during my care of the patient were reviewed by me and considered in my medical decision making (see chart for details).     Patient with right eyelid swelling since Friday.  Conjunctivae appears normal, no fluorescein uptake appreciated.  Some mild tenderness to the lateral aspect of the right upper lid, however no obvious stye.  No pain with extraocular movements.  No photophobia.  No vision changes.  Normal visual acuity.  Consider a mild preseptal cellulitis versus possible recent foreign body versus viral conjunctivitis.  Recommend warm compresses at home.  Discharged with topical antibiotic as a precaution.  Ophthalmology referral provided if symptoms persist or worsen.  Return precautions discussed.  Safe for discharge.  Discussed results, findings, treatment and follow up. Patient advised of return precautions. Patient verbalized understanding and agreed with plan.  Final Clinical Impressions(s) / ED Diagnoses   Final diagnoses:  Swelling of eyelid, right    ED Discharge Orders    None       Robinson, [place] N, PA-C 02/14/19 6599    Shaune Pollack, MD 02/14/19 1515

## 2019-02-14 NOTE — Discharge Instructions (Signed)
Apply warm compresses to your right eye multiple times per day. Apply the antibiotic ointment to your eye 4 times daily for 5 times. Schedule an appointment with the eye doctor to follow up if your symptoms persist or worsen. Return to the ED if you have vision loss, fever, significant pain.

## 2019-03-10 ENCOUNTER — Other Ambulatory Visit: Payer: Self-pay

## 2019-03-10 ENCOUNTER — Ambulatory Visit (HOSPITAL_COMMUNITY)
Admission: EM | Admit: 2019-03-10 | Discharge: 2019-03-10 | Disposition: A | Discharge status: Home / Self Care | Payer: Self-pay | Attending: Urgent Care | Admitting: Urgent Care

## 2019-03-10 ENCOUNTER — Encounter (HOSPITAL_COMMUNITY): Payer: Self-pay | Admitting: Emergency Medicine

## 2019-03-10 DIAGNOSIS — S39012A Strain of muscle, fascia and tendon of lower back, initial encounter: Secondary | ICD-10-CM

## 2019-03-10 DIAGNOSIS — M5137 Other intervertebral disc degeneration, lumbosacral region: Secondary | ICD-10-CM

## 2019-03-10 DIAGNOSIS — M5442 Lumbago with sciatica, left side: Secondary | ICD-10-CM

## 2019-03-10 MED ORDER — CYCLOBENZAPRINE HCL 10 MG PO TABS: 10.0000 mg | ORAL_TABLET | Freq: Three times a day (TID) | ORAL | 5 refills | Status: DC | PRN

## 2019-03-10 MED ORDER — METHYLPREDNISOLONE SODIUM SUCC 125 MG IJ SOLR
125.0000 mg | Freq: Once | INTRAMUSCULAR | Status: AC
  Administered 2019-03-10: 125 mg via INTRAMUSCULAR

## 2019-03-10 MED ORDER — METHYLPREDNISOLONE SODIUM SUCC 125 MG IJ SOLR
INTRAMUSCULAR | Status: AC
  Filled 2019-03-10: qty 2

## 2019-03-10 NOTE — ED Provider Notes (Signed)
  MRN: [id number] DOB: 03-24-1981  Subjective:   Jeremiah Curtis is a 38 y.o. male presenting for 3-day history of recurrent moderate to severe, worsening, sharp low back pain that radiates into his left leg shooting all the way down to his foot.  Patient has a history of degenerative disc disease, protruding disc of the lumbar region.  He has a back specialist.  Has used his Flexeril as prescribed.  Denies any trauma.  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  Takes amlodipine, Flexeril.    Allergies  Allergen Reactions  . Fish Allergy Anaphylaxis    Past Medical History:  Diagnosis Date  . Back symptoms, other   . Hypertension   . Idiopathic thrombocytopenic purpura (ITP) (HCC)      Past Surgical History:  Procedure Laterality Date  . BACK SURGERY      ROS  Objective:   Vitals: BP (!) 152/98 (BP Location: Right Arm)   Pulse 89   Temp 97.6 F (36.4 C) (Temporal)   Resp 20   SpO2 100%   Physical Exam Constitutional:      Appearance: Normal appearance. He is well-developed and normal weight.  HENT:     Head: Normocephalic and atraumatic.     Right Ear: External ear normal.     Left Ear: External ear normal.     Nose: Nose normal.     Mouth/Throat:     Pharynx: Oropharynx is clear.  Eyes:     Extraocular Movements: Extraocular movements intact.     Pupils: Pupils are equal, round, and reactive to light.  Cardiovascular:     Rate and Rhythm: Normal rate.  Pulmonary:     Effort: Pulmonary effort is normal.  Musculoskeletal:     Lumbar back: He exhibits decreased range of motion, tenderness (Positive straight leg raise to the left) and spasm. He exhibits no bony tenderness, no swelling and no edema.  Neurological:     Mental Status: He is alert and oriented to person, place, and time.     Sensory: No sensory deficit.     Coordination: Coordination abnormal (Limping favoring left side of his back and left leg).     Deep Tendon  Reflexes: Reflexes normal.  Psychiatric:        Mood and Affect: Mood normal.        Behavior: Behavior normal.     COMPARISON:  Radiographs dated 01/27/2013 and MRI dated 02/24/2013  FINDINGS: There is slight disc space narrowing at L4-5 and L5-S1. No spondylolisthesis. No appreciable facet arthritis. Sacroiliac joints appear normal.  IMPRESSION: Chronic degenerative disc disease at L4-5 and L5-S1, as indicated by slight disc space narrowing. Prior MRI of the lumbar spine in 2014 did demonstrate disc protrusions at L4-5 and L5-S1.   Electronically Signed   By: Francene Boyers M.D.   On: 09/03/2018 11:57  Assessment and Plan :   Strain of lumbar region, initial encounter  Acute left-sided low back pain with left-sided sciatica  Degeneration of lumbar or lumbosacral intervertebral disc  Use IM Solu-Medrol in clinic.  Patient is to use Flexeril and rest back otherwise.  Follow-up with back specialist. Counseled patient on potential for adverse effects with medications prescribed today, patient verbalized understanding. ER and return-to-clinic precautions discussed, patient verbalized understanding.     Wallis Bamberg, PA-C 03/10/19 1251

## 2019-03-10 NOTE — ED Triage Notes (Signed)
Pt presents to [facility] for "lots of years" of left buttocks, lower back and posterior leg pain.  States 2-3 days ago it flared up again.  Patient walking with a limp at triage.

## 2019-03-17 IMAGING — CT CT HEAD W/O CM
3 series · 15 of 47 positions shown, 18 images · non-contrast
Comparison: None.

CLINICAL DATA: Headache and light sensitivity.

EXAM:
CT HEAD WITHOUT CONTRAST
TECHNIQUE: Contiguous axial images were obtained from the base of the skull
through the vertex without intravenous contrast.

[Series 2: head wo · axial · 0.45mm/px · z∈[+269,+404]mm · 9 of 33 slices shown, 12 images]
[im 3/33  brain]
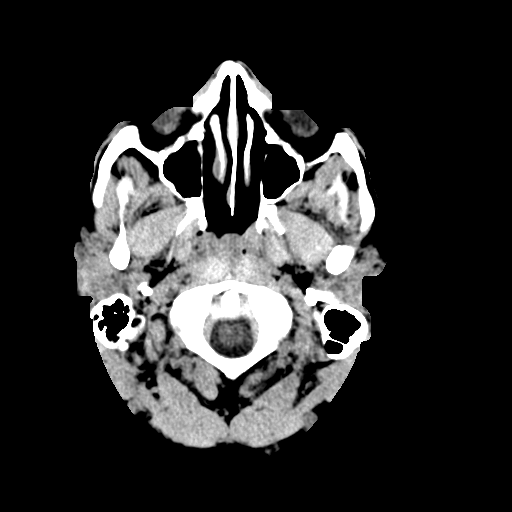
[im 3/33  bone]
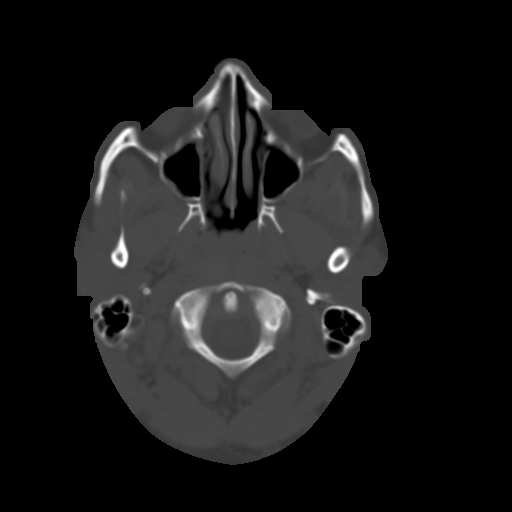
[im 6/33  brain]
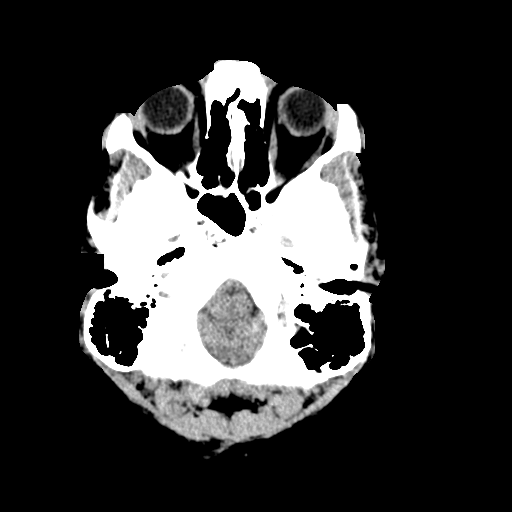
[im 9/33  brain]
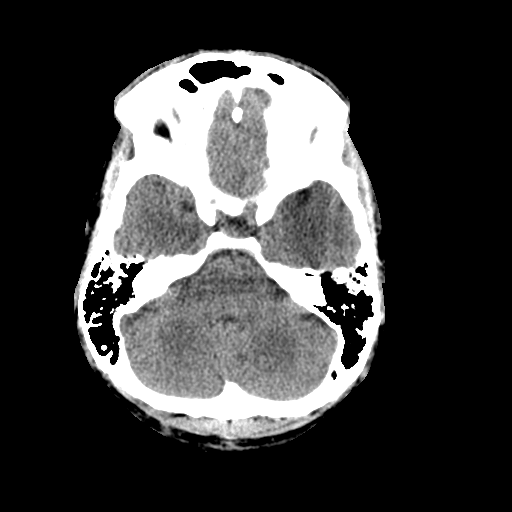
[im 13/33  brain]
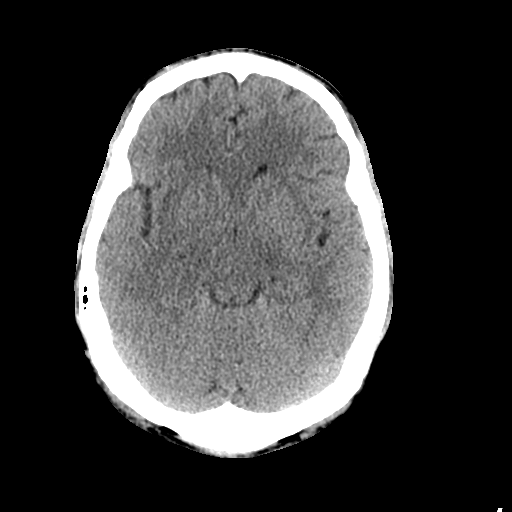
[im 17/33  brain]
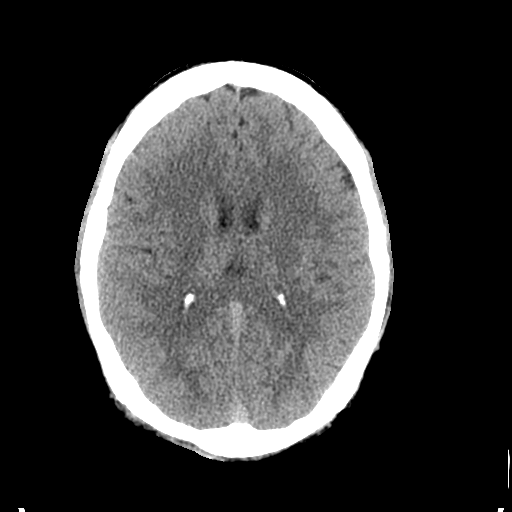
[im 17/33  bone]
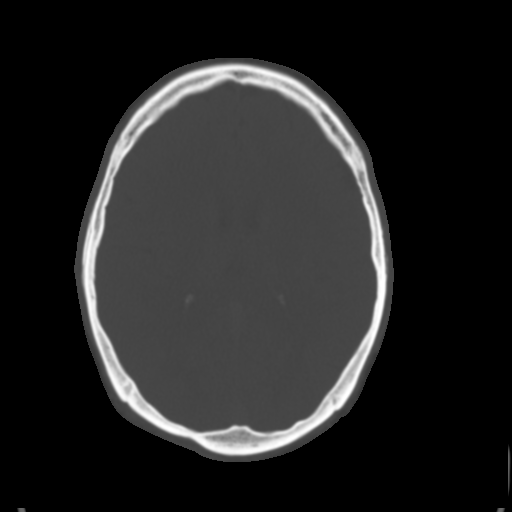
[im 20/33  brain]
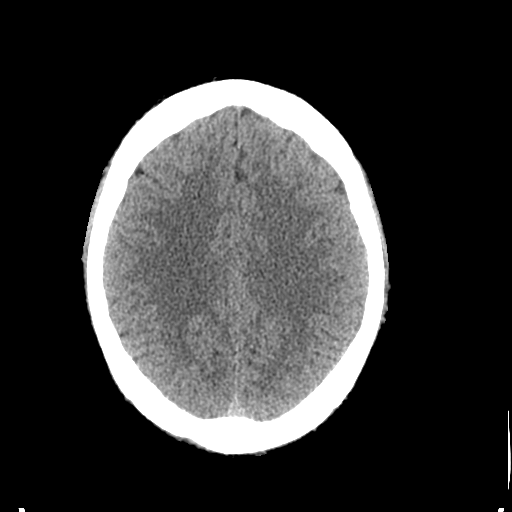
[im 24/33  brain]
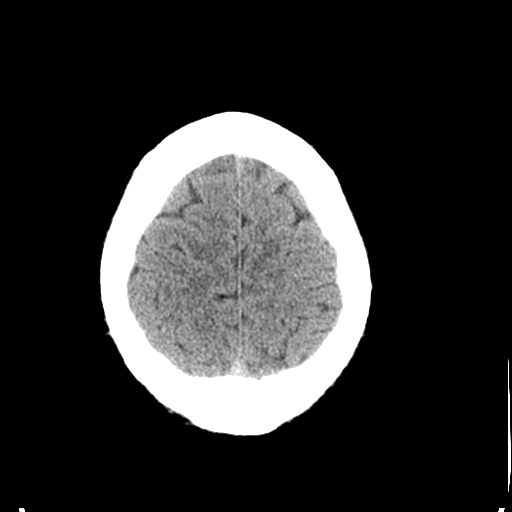
[im 27/33  brain]
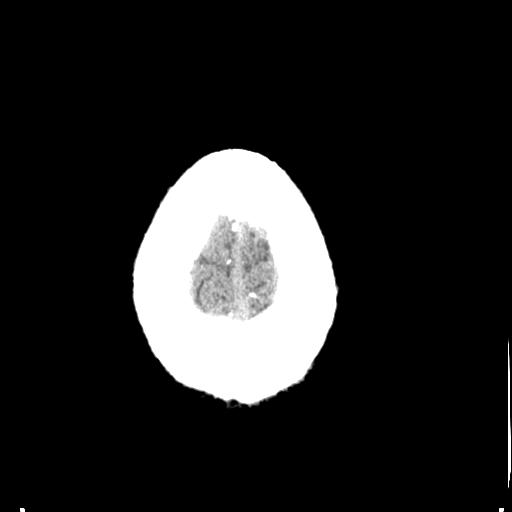
[im 30/33  brain]
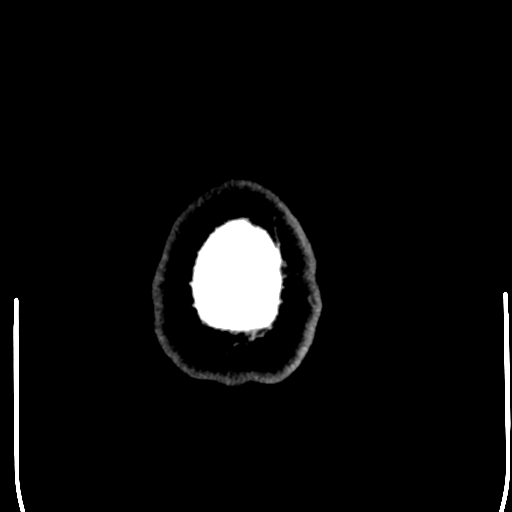
[im 30/33  bone]
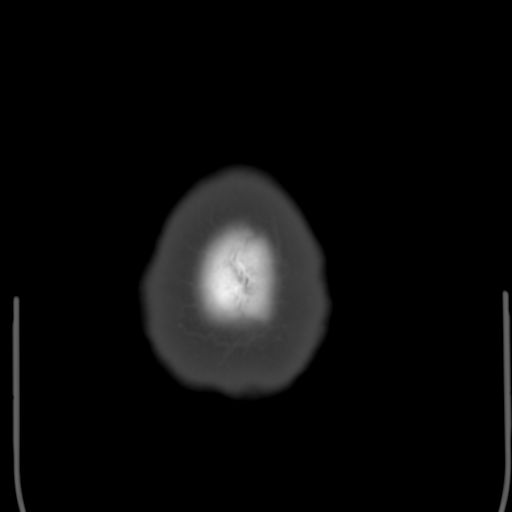

[Series 5: coronal soft tissue · coronal · 0.32mm/px · 3 of 79 slices shown]
[im 27/79  brain]
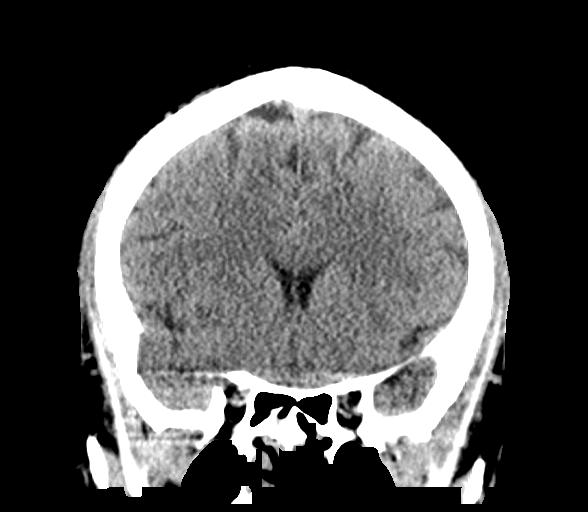
[im 35/79  brain]
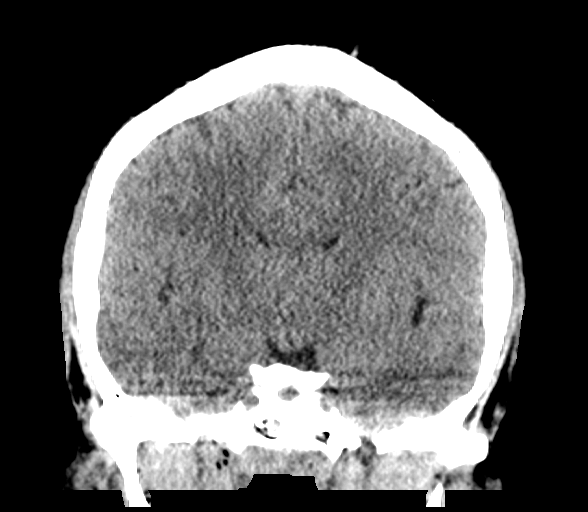
[im 44/79  brain]
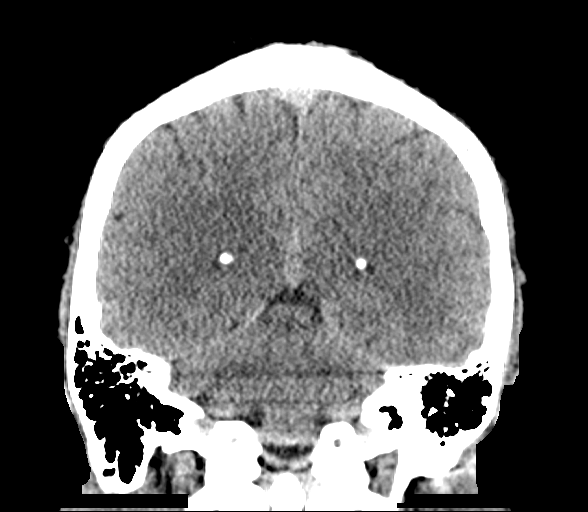

[Series 6: sagittal soft tissue · sagittal · 0.30mm/px · 3 of 67 slices shown]
[im 23/67  brain]
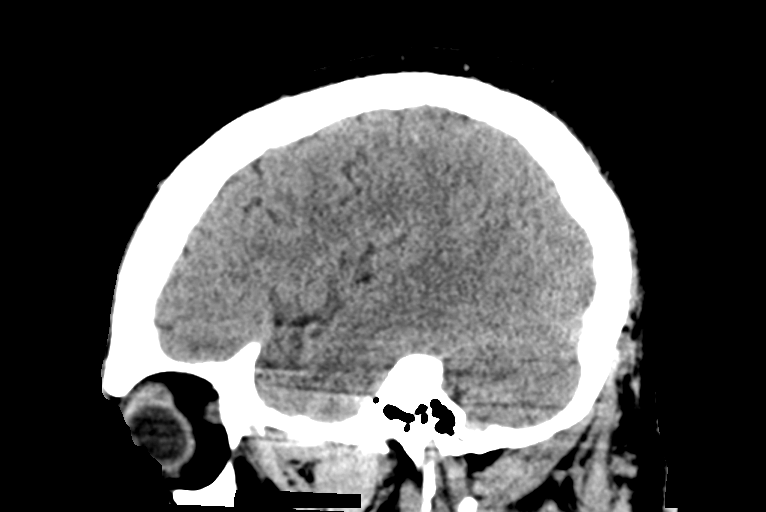
[im 34/67  brain]
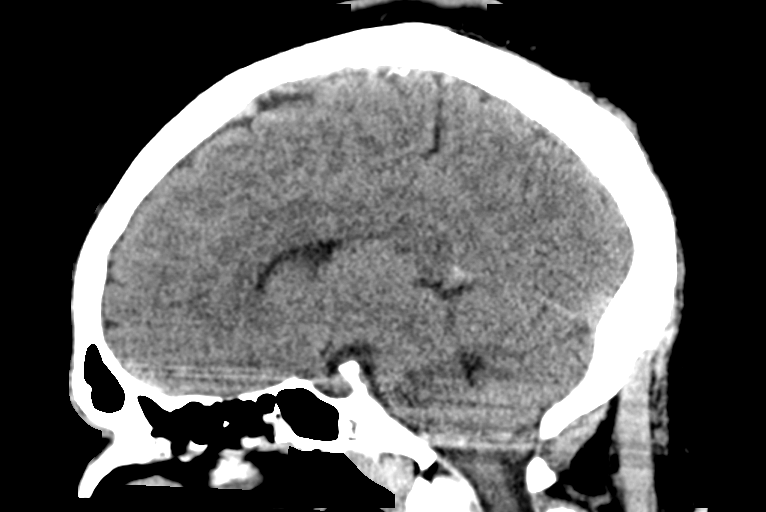
[im 45/67  brain]
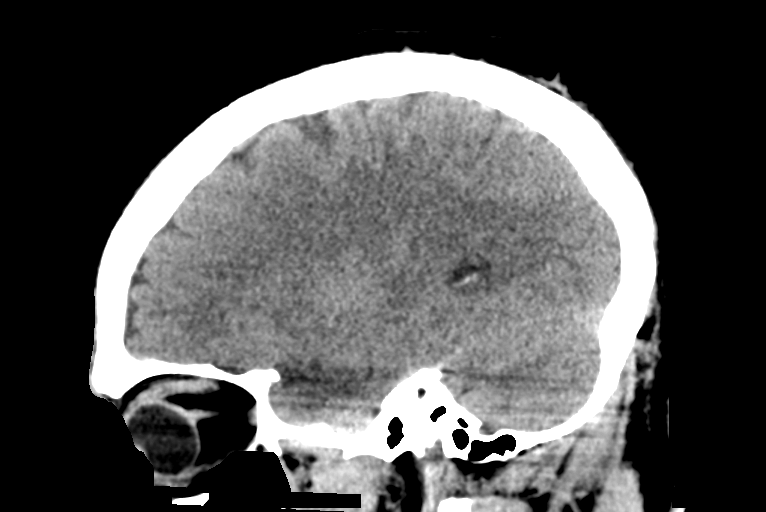

[15 of 47 positions shown; findings below may reference images not displayed]

FINDINGS: Brain: Normal appearing cerebral hemispheres and posterior fossa
structures. Normal size and position of the ventricles. No
intracranial hemorrhage, mass lesion or CT evidence of acute
infarction.

Vascular: No hyperdense vessel or unexpected calcification.

Skull: Normal. Negative for fracture or focal lesion.

Sinuses/Orbits: Minimal right maxillary sinus mucosal thickening and
a tiny left maxillary sinus retention cyst. Unremarkable orbits.

Other: None.
IMPRESSION: 1. No intracranial abnormality.
2. Minimal chronic right maxillary sinusitis.

## 2019-03-31 ENCOUNTER — Emergency Department (HOSPITAL_COMMUNITY): Payer: Self-pay

## 2019-03-31 ENCOUNTER — Encounter (HOSPITAL_COMMUNITY): Payer: Self-pay | Admitting: Emergency Medicine

## 2019-03-31 ENCOUNTER — Other Ambulatory Visit: Payer: Self-pay

## 2019-03-31 ENCOUNTER — Emergency Department (HOSPITAL_COMMUNITY)
Admission: EM | Admit: 2019-03-31 | Discharge: 2019-03-31 | Disposition: A | Discharge status: Home / Self Care | Payer: Self-pay | Attending: Emergency Medicine | Admitting: Emergency Medicine

## 2019-03-31 DIAGNOSIS — F17210 Nicotine dependence, cigarettes, uncomplicated: Secondary | ICD-10-CM | POA: Insufficient documentation

## 2019-03-31 DIAGNOSIS — R1110 Vomiting, unspecified: Secondary | ICD-10-CM | POA: Insufficient documentation

## 2019-03-31 DIAGNOSIS — R079 Chest pain, unspecified: Secondary | ICD-10-CM | POA: Insufficient documentation

## 2019-03-31 DIAGNOSIS — I10 Essential (primary) hypertension: Secondary | ICD-10-CM | POA: Insufficient documentation

## 2019-03-31 LAB — BASIC METABOLIC PANEL
Anion gap: 10 (ref 5–15)
BUN: 17 mg/dL (ref 6–20)
CO2: 23 mmol/L (ref 22–32)
Calcium: 9 mg/dL (ref 8.9–10.3)
Chloride: 108 mmol/L (ref 98–111)
Creatinine, Ser: 0.99 mg/dL (ref 0.61–1.24)
GFR calc Af Amer: >60 mL/min (ref >60)
GFR calc non Af Amer: >60 mL/min (ref >60)
Glucose, Bld: 85 mg/dL (ref 70–99)
Potassium: 3.7 mmol/L (ref 3.5–5.1)
Sodium: 141 mmol/L (ref 135–145)

## 2019-03-31 LAB — TROPONIN I: Troponin I: <0.03 ng/mL (ref <0.03)

## 2019-03-31 MED ORDER — LISINOPRIL 10 MG PO TABS: 10.0000 mg | ORAL_TABLET | Freq: Every day | ORAL | 0 refills | Status: DC

## 2019-03-31 NOTE — Discharge Instructions (Addendum)
Follow-up with a primary care doctor. °

## 2019-03-31 NOTE — ED Notes (Signed)
Patient verbalizes understanding of discharge instructions. Opportunity for questioning and answers were provided. Armband removed by staff, pt discharged from ED ambulatory to home.  

## 2019-03-31 NOTE — ED Provider Notes (Signed)
[facility] EMERGENCY DEPARTMENT Provider Note   CSN: [id number] Arrival date & time: 03/31/19  1614    History   Chief Complaint No chief complaint on file.   HPI Jeremiah Curtis is a 38 y.o. male.     HPI Patient presents with chest pain.  Has been on and off for the last few days.  States for last 20 minutes again clear.  It is sharp pain.  No blood return.  States he vomits in a way to hours.  States it seems to come on more he smokes cigarettes.  No fevers.  Has a mild cough that is really unchanged.  He is a smoker.  No abdominal pain.  No numbness weakness.  No confusion.  No fevers.  No known sick contacts.  Has a history of hypertension and states he is not been taking her medicine because it makes the vision in his right eye got worried. Past Medical History:  Diagnosis Date  . Back symptoms, other   . Hypertension   . Idiopathic thrombocytopenic purpura (ITP) (HCC)     There are no active problems to display for this patient.   Past Surgical History:  Procedure Laterality Date  . BACK SURGERY          Home Medications    Prior to Admission medications   Medication Sig Start Date End Date Taking? Authorizing Provider  cyclobenzaprine (FLEXERIL) 10 MG tablet Take 1 tablet (10 mg total) by mouth 3 (three) times daily as needed for muscle spasms. 03/10/19  Yes Wallis Bamberg, PA-C  naproxen sodium (ALEVE) 220 MG tablet Take 220 mg by mouth 2 (two) times daily as needed (pain).   Yes [provider]  lisinopril (PRINIVIL,ZESTRIL) 10 MG tablet Take 1 tablet (10 mg total) by mouth daily. 03/31/19   Benjiman Core, MD    Family History Family History  Problem Relation Age of Onset  . Hypertension Mother   . Hypertension Father     Social History Social History   Tobacco Use  . Smoking status: Current Every Day Smoker    Packs/day: 0.40    Types: Cigarettes  . Smokeless tobacco: Never Used  Substance Use Topics  . Alcohol use:  Yes    Frequency: Never  . Drug use: No     Allergies   Fish allergy   Review of Systems Review of Systems  Constitutional: Negative for appetite change.  HENT: Negative for congestion.   Eyes: Positive for visual disturbance.  Respiratory: Positive for cough and shortness of breath.   Cardiovascular: Positive for chest pain.  Gastrointestinal: Negative for abdominal pain.  Genitourinary: Negative for flank pain.  Musculoskeletal: Positive for back pain.       Patient has chronic lower back pain.  Skin: Negative for pallor and rash.  Neurological: Negative for weakness.  Psychiatric/Behavioral: Negative for behavioral problems.     Physical Exam Updated Vital Signs BP (!) 160/100 (BP Location: Right Arm)   Pulse 97   Temp 99.3 F (37.4 C) (Oral)   Resp 16   Ht 5\' 5"  (1.651 m)   Wt 77.1 kg   SpO2 100%   BMI 28.29 kg/m   Physical Exam Vitals signs and nursing note reviewed.  HENT:     Mouth/Throat:     Mouth: Mucous membranes are moist.  Cardiovascular:     Rate and Rhythm: Normal rate.  Pulmonary:     Effort: Pulmonary effort is normal.  Abdominal:  Tenderness: There is no abdominal tenderness.  Musculoskeletal:        General: No signs of injury.     Right lower leg: No edema.     Left lower leg: No edema.  Skin:    General: Skin is warm.     Capillary Refill: Capillary refill takes less than 2 seconds.  Neurological:     General: No focal deficit present.     Mental Status: He is alert.  Psychiatric:        Mood and Affect: Mood normal.      ED Treatments / Results  Labs (all labs ordered are listed, but only abnormal results are displayed) Labs Reviewed  BASIC METABOLIC PANEL  TROPONIN I    EKG EKG Interpretation  Date/Time:  Thursday March 31 2019 16:20:45 EDT Ventricular Rate:  90 PR Interval:    QRS Duration: 92 QT Interval:  305 QTC Calculation: 374 R Axis:   99 Text Interpretation:  Sinus rhythm Probable LVH with secondary  repol abnrm Confirmed by Benjiman Core [phone]) on 03/31/2019 4:24:37 PM   Radiology Dg Chest 2 View  Result Date: 03/31/2019 CLINICAL DATA:  Chest and back pain with cough for the past 2 days. EXAM: CHEST - 2 VIEW COMPARISON:  Chest x-ray dated August 22, 2018. FINDINGS: The heart size and mediastinal contours are within normal limits. Both lungs are clear. The visualized skeletal structures are unremarkable. IMPRESSION: No active cardiopulmonary disease. Electronically Signed   By: Obie Dredge M.D.   On: 03/31/2019 17:51    Procedures Procedures (including critical care time)  Medications Ordered in ED Medications - No data to display   Initial Impression / Assessment and Plan / ED Course  I have reviewed the triage vital signs and the nursing notes.  Pertinent labs & imaging results that were available during my care of the patient were reviewed by me and considered in my medical decision making (see chart for details).       Patient with chest pain.  Hypertension.  Has been off his blood pressure medicines.  X-ray reassuring.  Blood work reassuring for both kidney function and troponin.  Discharge home.  Patient states he stopped taking his blood pressure medicine which appeared to be Norvasc because he would get vision changes in his right eye.  Will switch to lisinopril but needs outpatient follow-up.  Patient states he will get a PCP.  Discharge home.  Final Clinical Impressions(s) / ED Diagnoses   Final diagnoses:  Chest pain, unspecified type  Hypertension, unspecified type    ED Discharge Orders         Ordered    lisinopril (PRINIVIL,ZESTRIL) 10 MG tablet  Daily     03/31/19 1826           Benjiman Core, MD 03/31/19 [phone]

## 2019-03-31 NOTE — ED Triage Notes (Signed)
Pt complains of chest pain and back pain with a cough x2 days.

## 2019-06-20 ENCOUNTER — Other Ambulatory Visit: Payer: Self-pay

## 2019-06-20 ENCOUNTER — Ambulatory Visit (HOSPITAL_COMMUNITY)
Admission: EM | Admit: 2019-06-20 | Discharge: 2019-06-20 | Disposition: A | Discharge status: Home / Self Care | Payer: Self-pay | Attending: Family Medicine | Admitting: Family Medicine

## 2019-06-20 ENCOUNTER — Encounter (HOSPITAL_COMMUNITY): Payer: Self-pay | Admitting: *Deleted

## 2019-06-20 DIAGNOSIS — H538 Other visual disturbances: Secondary | ICD-10-CM

## 2019-06-20 NOTE — ED Provider Notes (Signed)
[facility]    CSN: [id number] Arrival date & time: 06/20/19  1619      History   Chief Complaint No chief complaint on file. Blurry Vision  HPI Jeremiah HohKhalid Curtis is a 38 y.o. male history of hypertension presenting today with bilateral blurry vision.  Patient states that over the past 7 months he has had progressive blurry vision.  States that occasionally he will have difficulty focusing and difficulty seeing, this will subside after couple hours.  Of recently he is noticed his symptoms becoming more prominent affecting more of his vision field.  He denies associated pain with this.  On rare occasion will have associated headaches, but this is not a persistent symptom.  He denies blackening of vision.  Denies spots or floaters.  Denies previous eye issues.  Denies contact or glasses use.  He denies any itching irritation or drainage associated with his eyes.  He denies weakness.  On occasion he has noted difficulty thinking.  He is currently on lisinopril for his blood pressure but notes that he is not taking this over the past couple weeks.  He notes that he has checked his blood pressure and is typically elevated.  He denies chest pain or shortness of breath.  HPI  Past Medical History:  Diagnosis Date  . Back symptoms, other   . Hypertension   . Idiopathic thrombocytopenic purpura (ITP) (HCC)     There are no active problems to display for this patient.   Past Surgical History:  Procedure Laterality Date  . BACK SURGERY    . LEG SURGERY         Home Medications    Prior to Admission medications   Medication Sig Start Date End Date Taking? Authorizing Provider  lisinopril (PRINIVIL,ZESTRIL) 10 MG tablet Take 1 tablet (10 mg total) by mouth daily. 03/31/19  Yes Benjiman CorePickering, Nathan, MD  cyclobenzaprine (FLEXERIL) 10 MG tablet Take 1 tablet (10 mg total) by mouth 3 (three) times daily as needed for muscle spasms. 03/10/19   Wallis BambergMani, Mario, PA-C  naproxen sodium  (ALEVE) 220 MG tablet Take 220 mg by mouth 2 (two) times daily as needed (pain).    [provider]    Family History Family History  Problem Relation Age of Onset  . Hypertension Mother   . Hypertension Father     Social History Social History   Tobacco Use  . Smoking status: Current Every Day Smoker    Packs/day: 0.40    Types: Cigarettes  . Smokeless tobacco: Never Used  Substance Use Topics  . Alcohol use: Yes    Frequency: Never    Comment: occasionally  . Drug use: No     Allergies   Fish allergy   Review of Systems Review of Systems  Constitutional: Negative for fatigue and fever.  HENT: Negative for congestion, sinus pressure and sore throat.   Eyes: Positive for visual disturbance. Negative for photophobia and pain.  Respiratory: Negative for cough and shortness of breath.   Cardiovascular: Negative for chest pain.  Gastrointestinal: Negative for abdominal pain, nausea and vomiting.  Genitourinary: Negative for decreased urine volume and hematuria.  Musculoskeletal: Negative for myalgias, neck pain and neck stiffness.  Neurological: Negative for dizziness, syncope, facial asymmetry, speech difficulty, weakness, light-headedness, numbness and headaches.     Physical Exam Triage Vital Signs ED Triage Vitals  Enc Vitals Group     BP 06/20/19 1634 (!) 170/99     Pulse Rate 06/20/19 1634 97  Resp 06/20/19 1634 16     Temp 06/20/19 1634 99.2 F (37.3 C)     Temp Source 06/20/19 1634 Oral     SpO2 06/20/19 1634 99 %     Weight --      Height --      Head Circumference --      Peak Flow --      Pain Score 06/20/19 1635 0     Pain Loc --      Pain Edu? --      Excl. in GC? --    No data found.  Updated Vital Signs BP (!) 170/99 Comment: Has not taken HTN med today  Pulse 97   Temp 99.2 F (37.3 C) (Oral)   Resp 16   SpO2 99%   Visual Acuity Right Eye Distance: 20/40 -1 Left Eye Distance: 20/25 -1 Bilateral  Distance: 20/30  Right Eye Near:   Left Eye Near:    Bilateral Near:     Physical Exam Vitals signs and nursing note reviewed.  Constitutional:      General: He is not in acute distress.    Appearance: Normal appearance. He is well-developed.     Comments: Well appearing  HENT:     Head: Normocephalic and atraumatic.     Mouth/Throat:     Comments: Oral mucosa pink and moist, no tonsillar enlargement or exudate. Posterior pharynx patent and nonerythematous, no uvula deviation or swelling. Normal phonation. Palate elevates symmetrically Eyes:     Extraocular Movements: Extraocular movements intact.     Conjunctiva/sclera: Conjunctivae normal.     Pupils: Pupils are equal, round, and reactive to light.     Comments: No conjunctival erythema, no obvious drainage noted.  Anterior chamber appears clear.  Neck:     Musculoskeletal: Neck supple.  Cardiovascular:     Rate and Rhythm: Normal rate and regular rhythm.     Heart sounds: No murmur.  Pulmonary:     Effort: Pulmonary effort is normal. No respiratory distress.     Breath sounds: Normal breath sounds.     Comments: Breathing comfortably at rest, CTABL, no wheezing, rales or other adventitious sounds auscultated Abdominal:     Palpations: Abdomen is soft.     Tenderness: There is no abdominal tenderness.  Skin:    General: Skin is warm and dry.  Neurological:     Mental Status: He is alert.     Comments: Patient A&O x3, cranial nerves II-XII grossly intact, strength at shoulders, hips and knees 5/5, equal bilaterally, patellar reflex 1+ bilaterally. Gait without abnormality.      UC Treatments / Results  Labs (all labs ordered are listed, but only abnormal results are displayed) Labs Reviewed - No data to display  EKG None  Radiology No results found.  Procedures Procedures (including critical care time)  Medications Ordered in UC Medications - No data to display  Initial Impression / Assessment and Plan /  UC Course  I have reviewed the triage vital signs and the nursing notes.  Pertinent labs & imaging results that were available during my care of the patient were reviewed by me and considered in my medical decision making (see chart for details).     Patient with progressive blurry vision over the past 7 months.  Reports noncompliance with his blood pressure medicine of recently.  Blood pressure elevated today at 170/99 which is similar to previous readings at past visits.  Has been off lisinopril for the past couple  weeks.  No neuro deficits noted on exam today.  Visual acuity relatively intact, right slightly decreased from the left.  Blurry vision likely related to chronically uncontrolled blood pressure.  Recommending follow-up with ophthalmology for further evaluation.  Do not suspect ocular emergency at this time.  Do not suspect stroke.  Did discuss these concerns with patient regarding his blood pressure and blurry vision advised if symptoms progress or worsen to follow-up in the emergency room immediately.  Also recommended establishing care with a primary care in order to have better controlled blood pressure.Discussed strict return precautions. Patient verbalized understanding and is agreeable with plan.  Final Clinical Impressions(s) / UC Diagnoses   Final diagnoses:  Blurry vision, bilateral     Discharge Instructions     Your blurry vision is most likely related to your blood pressure, please continue to take your blood pressure medicine Please follow-up with ophthalmology for further evaluation of your blurry vision-please use contact information below Please also establish care with a primary care for further management of your blood pressure as this is uncontrolled  Your blood pressure was elevated today in clinic. Please be sure to take blood pressure medications as prescribed. Please monitor your blood pressure at home or when you go to a [facility]. Please follow up  with your primary care doctor to recheck blood pressure and discuss any need for medication changes.   Please go to Emergency Room if you start to experience severe headache, vision changes, decreased urine production, chest pain, shortness of breath, speech slurring, one sided weakness.   ED Prescriptions    None     Controlled Substance Prescriptions [place] Controlled Substance Registry consulted? Not Applicable   Janith Lima, [place] 06/20/19 1744

## 2019-06-20 NOTE — ED Triage Notes (Signed)
C/O bilat progressively worsening blurred vision x "7 months".  Denies injury, denies pain.

## 2019-06-20 NOTE — Discharge Instructions (Signed)
Your blurry vision is most likely related to your blood pressure, please continue to take your blood pressure medicine Please follow-up with ophthalmology for further evaluation of your blurry vision-please use contact information below Please also establish care with a primary care for further management of your blood pressure as this is uncontrolled  Your blood pressure was elevated today in clinic. Please be sure to take blood pressure medications as prescribed. Please monitor your blood pressure at home or when you go to a [facility]. Please follow up with your primary care doctor to recheck blood pressure and discuss any need for medication changes.   Please go to Emergency Room if you start to experience severe headache, vision changes, decreased urine production, chest pain, shortness of breath, speech slurring, one sided weakness.

## 2019-06-28 ENCOUNTER — Ambulatory Visit: Payer: Self-pay | Attending: Nurse Practitioner | Admitting: Nurse Practitioner

## 2019-06-28 ENCOUNTER — Encounter: Payer: Self-pay | Admitting: Nurse Practitioner

## 2019-06-28 ENCOUNTER — Other Ambulatory Visit: Payer: Self-pay

## 2019-06-28 VITALS — BP 134/93 | HR 83 | Temp 98.6°F | Ht 64.0 in | Wt 171.0 lb

## 2019-06-28 DIAGNOSIS — I10 Essential (primary) hypertension: Secondary | ICD-10-CM

## 2019-06-28 DIAGNOSIS — F17200 Nicotine dependence, unspecified, uncomplicated: Secondary | ICD-10-CM

## 2019-06-28 DIAGNOSIS — M48061 Spinal stenosis, lumbar region without neurogenic claudication: Secondary | ICD-10-CM

## 2019-06-28 MED ORDER — NICOTINE 7 MG/24HR TD PT24: 7.0000 mg | MEDICATED_PATCH | Freq: Every day | TRANSDERMAL | 0 refills | Status: DC

## 2019-06-28 MED ORDER — AMLODIPINE BESYLATE 5 MG PO TABS: 5.0000 mg | ORAL_TABLET | Freq: Every day | ORAL | 1 refills | Status: DC

## 2019-06-28 MED ORDER — NICOTINE 14 MG/24HR TD PT24: 14.0000 mg | MEDICATED_PATCH | Freq: Every day | TRANSDERMAL | 0 refills | Status: AC

## 2019-06-28 MED ORDER — CYCLOBENZAPRINE HCL 10 MG PO TABS: 10.0000 mg | ORAL_TABLET | Freq: Three times a day (TID) | ORAL | 5 refills | Status: DC | PRN

## 2019-06-28 MED ORDER — NAPROXEN 500 MG PO TABS: 500.0000 mg | ORAL_TABLET | Freq: Two times a day (BID) | ORAL | 1 refills | Status: AC

## 2019-06-28 MED FILL — CYCLOBENZAPRINE 10 MG TAB: 10 | 10 days supply | Qty: 30 | Fill #0

## 2019-06-28 MED FILL — AMLODIPINE BESYLATE 5 MG TA: 5 | 30 days supply | Qty: 30 | Fill #0

## 2019-06-28 MED FILL — NICOTINE 14 MG/24HR PATCH: 14 | 28 days supply | Qty: 28 | Fill #0

## 2019-06-28 MED FILL — NAPROXEN 500 MG TABLET: 500 | 30 days supply | Qty: 60 | Fill #0
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
## 2019-07-12 ENCOUNTER — Encounter: Payer: Self-pay | Admitting: Pharmacist

## 2019-07-12 ENCOUNTER — Other Ambulatory Visit: Payer: Self-pay

## 2019-07-12 ENCOUNTER — Ambulatory Visit: Payer: Self-pay | Attending: Nurse Practitioner | Admitting: Pharmacist

## 2019-07-12 VITALS — BP 149/84 | HR 83

## 2019-07-12 DIAGNOSIS — I10 Essential (primary) hypertension: Secondary | ICD-10-CM

## 2019-07-12 MED ORDER — AMLODIPINE BESYLATE 10 MG PO TABS: 10.0000 mg | ORAL_TABLET | Freq: Every day | ORAL | 2 refills | Status: DC

## 2019-07-12 NOTE — Patient Instructions (Signed)
Thank you for coming to see [place] today.   Blood pressure today is still elevated.   Start taking two of the 5 mg amlodipine tablets. I will send in a prescription of the 10 mg tablets for you to pick up once you run out of the 5's.  Limiting salt and caffeine, as well as exercising as able for at least 30 minutes for 5 days out of the week, can also help you lower your blood pressure.  Take your blood pressure at home if you are able. Please write down these numbers and bring them to your visits.  If you have any questions about medications, please call me [phone].  Lurena Joiner

## 2019-07-12 NOTE — Progress Notes (Signed)
   S:    PCP: Zela   Patient arrives in good spirits. Presents to the clinic for BP check. Patient was referred and last seen by Primary Care Provider on 06/28/19.   Patient reports adherence with amlodipine. Denies side effects. Denies BLE edema.   Denies chest pains, dyspnea, HA or dizziness.   Current BP Medications include:  Amlodipine 5 mg daily  Antihypertensives tried in the past include: lisinopril (nervousness)  Dietary habits include: does not limit salt; drinks sweet tea throughout the day Exercise habits include: none currently Family / Social history:  - FHx: HTN (mother, father) - Current smoker: smokes ~0.5 PPD  - Alcohol: occasionally  O:  Home BP readings: does not check  Last 3 Office BP readings: BP Readings from Last 3 Encounters:  07/12/19 (!) 149/84  06/28/19 (!) 134/93  06/20/19 (!) 170/99   BMET    Component Value Date/Time   NA 141 03/31/2019 1635   K 3.7 03/31/2019 1635   CL 108 03/31/2019 1635   CO2 23 03/31/2019 1635   GLUCOSE 85 03/31/2019 1635   BUN 17 03/31/2019 1635   CREATININE 0.99 03/31/2019 1635   CALCIUM 9.0 03/31/2019 1635   GFRNONAA >60 03/31/2019 1635   GFRAA >60 03/31/2019 1635    Renal function: CrCl cannot be calculated (Patient's most recent lab result is older than the maximum 21 days allowed.).  Clinical ASCVD: No  The ASCVD Risk score Mikey Bussing DC Jr., et al., 2013) failed to calculate for the following reasons:   The 2013 ASCVD risk score is only valid for ages 64 to 46  A/P: Hypertension longstanding currently uncontrolled on current medications. BP Goal <130/80 mmHg. Patient is adherent with current medications.  -Increased dose of amlodipine to 10 mg daily.  -Counseled on lifestyle modifications for blood pressure control including reduced dietary sodium, increased exercise, adequate sleep -ASCVD risk: pt does not have lipid panel on file. Recommend one at next PCP visit.  -HM: recommend pneumovax w/ smoking hx;  tetanus due. Pt deferred to PCP appointment.   Results reviewed and written information provided.   Total time in face-to-face counseling 20 minutes.   F/U Clinic Visit in ~1 month with Zelda.   Benard Halsted, PharmD, [place] [phone]

## 2019-07-13 ENCOUNTER — Encounter: Payer: Self-pay | Admitting: Pharmacist

## 2019-07-26 ENCOUNTER — Ambulatory Visit: Payer: Medicaid Other | Admitting: Nurse Practitioner

## 2019-08-16 ENCOUNTER — Encounter (HOSPITAL_COMMUNITY): Payer: Self-pay | Admitting: Emergency Medicine

## 2019-08-16 ENCOUNTER — Other Ambulatory Visit: Payer: Self-pay

## 2019-08-16 ENCOUNTER — Emergency Department (HOSPITAL_COMMUNITY)
Admission: EM | Admit: 2019-08-16 | Discharge: 2019-08-16 | Disposition: A | Discharge status: Home / Self Care | Payer: Medicaid Other | Attending: Emergency Medicine | Admitting: Emergency Medicine

## 2019-08-16 ENCOUNTER — Encounter (HOSPITAL_COMMUNITY): Payer: Self-pay | Admitting: Student

## 2019-08-16 ENCOUNTER — Ambulatory Visit (HOSPITAL_COMMUNITY)
Admission: EM | Admit: 2019-08-16 | Discharge: 2019-08-16 | Disposition: A | Discharge status: Home / Self Care | Payer: Medicaid Other | Attending: Family Medicine | Admitting: Family Medicine

## 2019-08-16 DIAGNOSIS — H538 Other visual disturbances: Secondary | ICD-10-CM

## 2019-08-16 DIAGNOSIS — F17210 Nicotine dependence, cigarettes, uncomplicated: Secondary | ICD-10-CM | POA: Insufficient documentation

## 2019-08-16 DIAGNOSIS — Z79899 Other long term (current) drug therapy: Secondary | ICD-10-CM | POA: Insufficient documentation

## 2019-08-16 DIAGNOSIS — I160 Hypertensive urgency: Secondary | ICD-10-CM

## 2019-08-16 DIAGNOSIS — I10 Essential (primary) hypertension: Secondary | ICD-10-CM | POA: Insufficient documentation

## 2019-08-16 MED ORDER — FLUORESCEIN SODIUM 1 MG OP STRP
1.0000 | ORAL_STRIP | Freq: Once | OPHTHALMIC | Status: AC
  Administered 2019-08-16: 1 via OPHTHALMIC
  Filled 2019-08-16: qty 1

## 2019-08-16 MED ORDER — TETRACAINE HCL 0.5 % OP SOLN
2.0000 [drp] | Freq: Once | OPHTHALMIC | Status: AC
  Administered 2019-08-16: 2 [drp] via OPHTHALMIC
  Filled 2019-08-16: qty 4

## 2019-08-16 MED FILL — AMLODIPINE BESYLATE 10 MG T: 10 | 30 days supply | Qty: 30 | Fill #0

## 2019-08-16 NOTE — ED Provider Notes (Addendum)
[facility] Provider Note   CSN: [id number] Arrival date & time: 08/16/19  1622     History   Chief Complaint Chief Complaint  Patient presents with  . Blurred Vision  . Hypertension    HPI Jeremiah Curtis is a 38 y.o. male with a hx of tobacco abuse, HTN, & ITP who [facility] with complaints of HTN & blurry vision today. Patient states he has had issues with HTN for "awhile" now- he states that his BP typically runs 150s-160s/80s-90s. He was initially placed on Lisinopril which was switched to Amlodipine per his report- had been taking Amlodipine as prescribed, but ran out 2 weeks ago, and therefore started taking some left over Lisinopril while waiting for PCP f/u appointment. Today he had his f/u w/ his PCP & his Amlodipine was refilled, but he was referred to urgent care & ultimately to the ER for blurry vision. Patient states he has been having intermittent blurry vision x 1 year. He states initially blurry vision was to R eye, but now is bilateral when it occurs & is associated with eye discomfort. He states this occurs daily, typically in the AM/afternoon, lasts for a couple of hours, then resolves. Today it began around 11 & persisted longer than it typically does which concerned him some. He states since ED arrival the blurry vision is resolved. He is currently asymptomatic. Denies headache, numbness, weakness, dizziness, syncope, double vision, vision loss, chest pain, dyspnea, nausea, or vomiting. Denies eye drainage, redness, or periorbital swelling.     HPI  Past Medical History:  Diagnosis Date  . Back symptoms, other   . Hypertension   . Idiopathic thrombocytopenic purpura (ITP) (HCC)     There are no active problems to display for this patient.   Past Surgical History:  Procedure Laterality Date  . BACK SURGERY    . LEG SURGERY          Home Medications    Prior to Admission medications   Medication  Sig Start Date End Date Taking? Authorizing Provider  amLODipine (NORVASC) 10 MG tablet Take 1 tablet (10 mg total) by mouth daily. 07/12/19   Hoy RegisterNewlin, Enobong, MD  cyclobenzaprine (FLEXERIL) 10 MG tablet Take 1 tablet (10 mg total) by mouth 3 (three) times daily as needed for muscle spasms. Patient will pick up scripts today. 06/28/19   Claiborne RiggFleming, Zelda W, NP  nicotine (NICODERM CQ - DOSED IN MG/24 HR) 7 mg/24hr patch Place 1 patch (7 mg total) onto the skin daily. 08/09/19 09/20/19  Claiborne RiggFleming, Zelda W, NP    Family History Family History  Problem Relation Age of Onset  . Hypertension Mother   . Hypertension Father   . Diabetes Maternal Grandmother   . Diabetes Paternal Grandmother     Social History Social History   Tobacco Use  . Smoking status: Current Every Day Smoker    Packs/day: 0.40    Types: Cigarettes  . Smokeless tobacco: Never Used  Substance Use Topics  . Alcohol use: Yes    Frequency: Never    Comment: occasionally  . Drug use: No     Allergies   Fish allergy   Review of Systems Review of Systems  Constitutional: Negative for chills and fever.  Eyes: Positive for pain (resolved @ present) and visual disturbance (resolved @ present). Negative for photophobia, discharge, redness and itching.  Respiratory: Negative for shortness of breath.   Cardiovascular: Negative for chest pain.  Gastrointestinal:  Negative for abdominal pain, nausea and vomiting.  Neurological: Negative for dizziness, tremors, seizures, syncope, facial asymmetry, speech difficulty, weakness, light-headedness, numbness and headaches.  All other systems reviewed and are negative.    Physical Exam Updated Vital Signs BP (!) 170/103 (BP Location: Right Arm)   Pulse 71   Temp 98.8 F (37.1 C) (Oral)   Resp 20   SpO2 100%   Physical Exam Vitals signs and nursing note reviewed.  Constitutional:      General: He is not in acute distress.    Appearance: He is well-developed. He is not  toxic-appearing.  HENT:     Head: Normocephalic and atraumatic.  Eyes:     General: Lids are everted, no foreign bodies appreciated. Vision grossly intact. No visual field deficit.       Right eye: No discharge.        Left eye: No discharge.     Extraocular Movements: Extraocular movements intact.     Conjunctiva/sclera: Conjunctivae normal.     Right eye: Right conjunctiva is not injected. No chemosis, exudate or hemorrhage.    Left eye: Left conjunctiva is not injected. No chemosis, exudate or hemorrhage.    Pupils: Pupils are equal, round, and reactive to light.     Comments: No periorbital edema/erythema/warmth.  Fluorescein stain bilaterally: No stain uptake- no appreciable abrasion, ulceration, dendritic stain, or hyphema. Negative seidel.  IOP: R: 19 L 18 Visual acuity:  Bilateral Near: 20/16 R Near: 20/20 L Near: 20/20  Neck:     Musculoskeletal: Neck supple.  Cardiovascular:     Rate and Rhythm: Normal rate and regular rhythm.  Pulmonary:     Effort: Pulmonary effort is normal. No respiratory distress.     Breath sounds: Normal breath sounds. No wheezing, rhonchi or rales.  Abdominal:     General: There is no distension.     Palpations: Abdomen is soft.     Tenderness: There is no abdominal tenderness.  Skin:    General: Skin is warm and dry.     Findings: No rash.  Neurological:     Mental Status: He is alert.     Comments: Alert. Clear speech. No facial droop. CNIII-XII grossly intact. Bilateral upper and lower extremities' sensation grossly intact. 5/5 symmetric strength with grip strength and with plantar and dorsi flexion bilaterally. Patellar  Normal finger to nose bilaterally. Negative pronator drift.  Gait is steady and intact.    Psychiatric:        Behavior: Behavior normal.    ED Treatments / Results  Labs (all labs ordered are listed, but only abnormal results are displayed) Labs Reviewed - No data to display  EKG None  Radiology No results  found.  Procedures Procedures (including critical care time)  Medications Ordered in ED Medications - No data to display   Initial Impression / Assessment and Plan / ED Course  I have reviewed the triage vital signs and the nursing notes.  Pertinent labs & imaging results that were available during my care of the patient were reviewed by me and considered in my medical decision making (see chart for details).   Patient presents to the ED w/ complaints of problems with HTN & intermittent blurry vision x 1 year.  Nontoxic appearing, no apparent distress, vitals WNL with the exception of elevated BP which had down-trended in the ED & appears similar to patient's reported baseline.   No visual field cut or significant visual acuity deficit.  No neuro deficits  to suggest acute ischemic or hemorrhagic CVA.  There is no fluorescein uptake on exam, no indication of corneal abrasion/ulceration or HSV.  Patient is afebrile and without proptosis, entrapment, or consensual photophobia, no periorbital swelling- not consistent periorbital or orbital cellulitis.  IOP WNL, doubt acute glaucoma.  With his overall presentation I additionally do not suspect retinal detachment or retinal occlusion.   Remains asymptomatic throughout my care of patient. I personally repeated BP: 132/97.  He appears appropriate for discharge home @ this time with close PCP & ophthalmology follow up.  I discussed results, treatment plan, need for follow-up, and return precautions with the patient. Provided opportunity for questions, patient confirmed understanding and is in agreement with plan.   Findings and plan of care discussed with supervising physician Dr. Ronnald Nian who has provided guidance & is in agreement.   Final Clinical Impressions(s) / ED Diagnoses   Final diagnoses:  Hypertension, unspecified type  Blurry vision    ED Discharge Orders    None       Amaryllis Dyke, PA-C 08/16/19 2334     Amaryllis Dyke, PA-C 08/16/19 2335    Lennice Sites, DO 08/16/19 2354

## 2019-08-16 NOTE — ED Notes (Signed)
Pt reports now his vision is completely back to normal.

## 2019-08-16 NOTE — ED Provider Notes (Addendum)
[facility]    CSN: [id number] Arrival date & time: 08/16/19  1355     History   Chief Complaint Chief Complaint  Patient presents with  . Hypertension    HPI Jerilee HohKhalid Selway is a 38 y.o. male history of tobacco use, hypertension, presenting today for evaluation of blurry vision and elevated blood pressure.  Patient states that he has recently ran out of his blood pressure medicine.  Attempted to get to his PCP to get a refill.  He is presenting here as he has had a concern over his blurry vision.  He notes that over the past 6 to 8 months he has had blurry vision in both eyes that comes and goes.  Today he had worsening blurry vision that has stayed in his right eye.  He denies associated pain.  Denies associated headaches.  Denies associated weakness or difficulty speaking.  Blurry vision in left eye has been intermittent.  He does have associated burning and feels as if his eyes are dry, denies any drainage.  Denies redness.  He was seen here on 6/22 for similar and has since followed up with primary care.  He is recommended to see ophthalmology which she has not done.  According to primary care notes he should be on amlodipine 10 mg.  Patient is also wishing to be back on lisinopril as well.   HPI  Past Medical History:  Diagnosis Date  . Back symptoms, other   . Hypertension   . Idiopathic thrombocytopenic purpura (ITP) (HCC)     There are no active problems to display for this patient.   Past Surgical History:  Procedure Laterality Date  . BACK SURGERY    . LEG SURGERY         Home Medications    Prior to Admission medications   Medication Sig Start Date End Date Taking? Authorizing Provider  amLODipine (NORVASC) 10 MG tablet Take 1 tablet (10 mg total) by mouth daily. 07/12/19   Hoy RegisterNewlin, Enobong, MD  cyclobenzaprine (FLEXERIL) 10 MG tablet Take 1 tablet (10 mg total) by mouth 3 (three) times daily as needed for muscle spasms. Patient will pick up  scripts today. 06/28/19   Claiborne RiggFleming, Zelda W, NP  nicotine (NICODERM CQ - DOSED IN MG/24 HR) 7 mg/24hr patch Place 1 patch (7 mg total) onto the skin daily. 08/09/19 09/20/19  Claiborne RiggFleming, Zelda W, NP    Family History Family History  Problem Relation Age of Onset  . Hypertension Mother   . Hypertension Father   . Diabetes Maternal Grandmother   . Diabetes Paternal Grandmother     Social History Social History   Tobacco Use  . Smoking status: Current Every Day Smoker    Packs/day: 0.40    Types: Cigarettes  . Smokeless tobacco: Never Used  Substance Use Topics  . Alcohol use: Yes    Frequency: Never    Comment: occasionally  . Drug use: No     Allergies   Fish allergy   Review of Systems Review of Systems  Constitutional: Negative for fatigue and fever.  HENT: Negative for congestion, sinus pressure and sore throat.   Eyes: Positive for visual disturbance. Negative for photophobia and pain.  Respiratory: Negative for cough and shortness of breath.   Cardiovascular: Negative for chest pain.  Gastrointestinal: Negative for abdominal pain, nausea and vomiting.  Genitourinary: Negative for decreased urine volume and hematuria.  Musculoskeletal: Negative for myalgias, neck pain and neck stiffness.  Neurological: Negative for dizziness,  syncope, facial asymmetry, speech difficulty, weakness, light-headedness, numbness and headaches.     Physical Exam Triage Vital Signs ED Triage Vitals  Enc Vitals Group     BP 08/16/19 1426 (!) 171/99     Pulse Rate 08/16/19 1426 79     Resp 08/16/19 1426 18     Temp 08/16/19 1426 98.7 F (37.1 C)     Temp Source 08/16/19 1426 Oral     SpO2 08/16/19 1426 99 %     Weight --      Height --      Head Circumference --      Peak Flow --      Pain Score 08/16/19 1427 0     Pain Loc --      Pain Edu? --      Excl. in [place]? --    No data found.  Updated Vital Signs BP (!) 171/99 (BP Location: Left Arm)   Pulse 79   Temp 98.7 F (37.1  C) (Oral)   Resp 18   SpO2 99%   Visual Acuity Right Eye Distance:   Left Eye Distance:   Bilateral Distance:    Right Eye Near:   Left Eye Near:    Bilateral Near:     Physical Exam Vitals signs and nursing note reviewed.  Constitutional:      Appearance: He is well-developed.     Comments: No acute distress  HENT:     Head: Normocephalic and atraumatic.     Ears:     Comments: Bilateral ears without tenderness to palpation of external auricle, tragus and mastoid, EAC's without erythema or swelling, TM's with good bony landmarks and cone of light. Non erythematous.     Nose: Nose normal.     Mouth/Throat:     Comments: Oral mucosa pink and moist, no tonsillar enlargement or exudate. Posterior pharynx patent and nonerythematous, no uvula deviation or swelling. Normal phonation. Palate elevates symmetrically Eyes:     Extraocular Movements: Extraocular movements intact.     Conjunctiva/sclera: Conjunctivae normal.     Pupils: Pupils are equal, round, and reactive to light.     Comments: Conjunctive a without erythema, no notable drainage  Neck:     Musculoskeletal: Neck supple.  Cardiovascular:     Rate and Rhythm: Normal rate.  Pulmonary:     Effort: Pulmonary effort is normal. No respiratory distress.     Comments: Breathing comfortably at rest, CTABL, no wheezing, rales or other adventitious sounds auscultated  Abdominal:     General: There is no distension.  Musculoskeletal: Normal range of motion.  Skin:    General: Skin is warm and dry.  Neurological:     General: No focal deficit present.     Mental Status: He is alert and oriented to person, place, and time. Mental status is at baseline.     Comments: Patient A&O x3, cranial nerves II-XII grossly intact, strength at shoulders, hips and knees 5/5, equal bilaterally, patellar reflex 2+ bilaterally. Gait without abnormality.      UC Treatments / Results  Labs (all labs ordered are listed, but only abnormal  results are displayed) Labs Reviewed - No data to display  EKG   Radiology No results found.  Procedures Procedures (including critical care time)  Medications Ordered in UC Medications - No data to display  Initial Impression / Assessment and Plan / UC Course  I have reviewed the triage vital signs and the nursing notes.  Pertinent labs &  imaging results that were available during my care of the patient were reviewed by me and considered in my medical decision making (see chart for details).     Blood pressure elevated today at 170/99.  Patient has chronic issues with blurring of eyes, likely needs ophthalmology follow-up as may have hypertensive changes to eyes.  Given patient with acute onset of worsening vision in right eye 3 hours ago, recommending further follow-up in emergency room for evaluation of this.  Otherwise neurologically intact and without deficit. Considering possible hypertensive urgency vs CVA vs. Retinal occlusion. Feels symptoms less likely from conjunctivitis/dry eyes as cause of decreased vision.   Patient verbalized understanding and plans to follow-up in emergency room.  Patient stable on discharge.  Final Clinical Impressions(s) / UC Diagnoses   Final diagnoses:  Hypertensive urgency  Blurry vision, bilateral     Discharge Instructions     Please go to emergency room for further evaluation of blurry vision    ED Prescriptions    None     Controlled Substance Prescriptions [place] Controlled Substance Registry consulted? Not Applicable   Lew DawesWieters, Omauri Boeve C, PA-C 08/16/19 1516    Lew DawesWieters, Anshika Pethtel C, PA-C 08/16/19 1517    Lew DawesWieters, Zacari Radick C, [place]PA-C 08/16/19 1518

## 2019-08-16 NOTE — Discharge Instructions (Signed)
You were seen in the ER today for blurry vision and high blood pressure.  Your blood pressure improved while you were in the ER Please take blood pressure medicines as prescribed Please follow up with primary care & ophthalmology (eye specialist) within the next 1-3 days, we have given you ophthalmology follow up information in your discharge instructions.  Return to the ER for new or worsening symptoms including but not limited to loss of vision, persistent blurry vision, floaters, severe headache, passing out, chest pain, trouble breathing, dizziness, weakness/numbness to limb(s), or any other concerns.

## 2019-08-16 NOTE — ED Triage Notes (Signed)
Pt sts blurry vision with hx of same and htn; pt sts out of regular BP meds and taking old medication

## 2019-08-16 NOTE — Discharge Instructions (Signed)
Please go to emergency room for further evaluation of blurry vision

## 2019-08-16 NOTE — ED Triage Notes (Signed)
Pt here from [facility] for evaluation of hypertension since yesterday and bilateral blurred vision, R worse than left, onset today. Reports feeling generally tired. Compliant with blood pressure medications.

## 2019-08-29 ENCOUNTER — Ambulatory Visit: Payer: Self-pay | Attending: Nurse Practitioner | Admitting: Nurse Practitioner

## 2019-08-29 ENCOUNTER — Other Ambulatory Visit: Payer: Self-pay

## 2019-08-29 ENCOUNTER — Encounter: Payer: Self-pay | Admitting: Nurse Practitioner

## 2019-08-29 ENCOUNTER — Ambulatory Visit: Payer: Medicaid Other

## 2019-08-29 DIAGNOSIS — Z862 Personal history of diseases of the blood and blood-forming organs and certain disorders involving the immune mechanism: Secondary | ICD-10-CM | POA: Insufficient documentation

## 2019-08-29 DIAGNOSIS — Z79899 Other long term (current) drug therapy: Secondary | ICD-10-CM | POA: Insufficient documentation

## 2019-08-29 DIAGNOSIS — Z13220 Encounter for screening for lipoid disorders: Secondary | ICD-10-CM | POA: Insufficient documentation

## 2019-08-29 DIAGNOSIS — I10 Essential (primary) hypertension: Secondary | ICD-10-CM | POA: Insufficient documentation

## 2019-08-29 DIAGNOSIS — Z833 Family history of diabetes mellitus: Secondary | ICD-10-CM | POA: Insufficient documentation

## 2019-08-29 DIAGNOSIS — Z87891 Personal history of nicotine dependence: Secondary | ICD-10-CM | POA: Insufficient documentation

## 2019-08-29 DIAGNOSIS — Z114 Encounter for screening for human immunodeficiency virus [HIV]: Secondary | ICD-10-CM | POA: Insufficient documentation

## 2019-08-29 DIAGNOSIS — H538 Other visual disturbances: Secondary | ICD-10-CM

## 2019-08-29 DIAGNOSIS — Z8249 Family history of ischemic heart disease and other diseases of the circulatory system: Secondary | ICD-10-CM | POA: Insufficient documentation

## 2019-08-29 MED ORDER — HYDROCHLOROTHIAZIDE 25 MG PO TABS: 25.0000 mg | ORAL_TABLET | Freq: Every day | ORAL | 3 refills | Status: DC

## 2019-08-29 MED FILL — HYDROCHLOROTHIAZIDE 25 MG T: 25 | 30 days supply | Qty: 30 | Fill #0

## 2019-08-29 NOTE — Progress Notes (Signed)
Virtual Visit via Telephone Note Due to national recommendations of social distancing due to [place] 19, telehealth visit is felt to be most appropriate for this patient at this time.  I discussed the limitations, risks, security and privacy concerns of performing an evaluation and management service by telephone and the availability of in person appointments. I also discussed with the patient that there may be a patient responsible charge related to this service. The patient expressed understanding and agreed to proceed.    I connected with Jeremiah Curtis on 08/29/19  at   9:10 AM EDT  EDT by telephone and verified that I am speaking with the correct person using two identifiers.   Consent I discussed the limitations, risks, security and privacy concerns of performing an evaluation and management service by telephone and the availability of in person appointments. I also discussed with the patient that there may be a patient responsible charge related to this service. The patient expressed understanding and agreed to proceed.   Location of Patient: Private Residence    Location of Provider: [place] and [place] participating in Telemedicine visit: Geryl Rankins FNP-BC [place]    History of Present Illness: Telemedicine visit for: HTN  has a past medical history of Back symptoms, other, Hypertension, and Idiopathic thrombocytopenic purpura (ITP) ([place]).    ESSENTIAL HYPERTENSION He is monitoring his blood pressures at home. Most recent reading today 165/97. Will add HCTZ 37m daily. He is currently taking amlodipine 177m Denies chest pain, shortness of breath, palpitations, lightheadedness, dizziness, headaches or BLE edema.  He does endorse chronic blurry vision for over 1 year.  States he recently saw an eye doctor and they told him he had a "bruise on his eye".  He is unable to give me any specific details of what this  actually means.  I asked him was this related to his blood pressure and he states "they don't seem to know so did not tell me anything".  Past Medical History:  Diagnosis Date  . Back symptoms, other   . Hypertension   . Idiopathic thrombocytopenic purpura (ITP) (HCC)     Past Surgical History:  Procedure Laterality Date  . BACK SURGERY    . LEG SURGERY      Family History  Problem Relation Age of Onset  . Hypertension Mother   . Hypertension Father   . Diabetes Maternal Grandmother   . Diabetes Paternal Grandmother     Social History   Socioeconomic History  . Marital status: Married    Spouse name: Not on file  . Number of children: Not on file  . Years of education: Not on file  . Highest education level: Not on file  Occupational History  . Not on file  Social Needs  . Financial resource strain: Not on file  . Food insecurity    Worry: Not on file    Inability: Not on file  . Transportation needs    Medical: Not on file    Non-medical: Not on file  Tobacco Use  . Smoking status: Former Smoker    Packs/day: 0.40    Types: Cigarettes  . Smokeless tobacco: Never Used  Substance and Sexual Activity  . Alcohol use: Yes    Frequency: Never    Comment: occasionally  . Drug use: No  . Sexual activity: Yes  Lifestyle  . Physical activity    Days per week: Not on file    Minutes  per session: Not on file  . Stress: Not on file  Relationships  . Social Herbalist on phone: Not on file    Gets together: Not on file    Attends religious service: Not on file    Active member of club or organization: Not on file    Attends meetings of clubs or organizations: Not on file    Relationship status: Not on file  Other Topics Concern  . Not on file  Social History Narrative  . Not on file     Observations/Objective: Awake, alert and oriented x 3   Review of Systems  Constitutional: Negative for fever, malaise/fatigue and weight loss.  HENT: Negative.   Negative for nosebleeds.   Eyes: Positive for blurred vision. Negative for double vision and photophobia.  Respiratory: Negative.  Negative for cough and shortness of breath.   Cardiovascular: Negative.  Negative for chest pain, palpitations and leg swelling.  Gastrointestinal: Negative.  Negative for heartburn, nausea and vomiting.  Musculoskeletal: Negative.  Negative for myalgias.  Neurological: Negative.  Negative for dizziness, focal weakness, seizures and headaches.  Psychiatric/Behavioral: Negative.  Negative for suicidal ideas.    Assessment and Plan: Jeremiah Curtis was seen today for follow-up.  Diagnoses and all orders for this visit:  Essential hypertension -     hydrochlorothiazide (HYDRODIURIL) 25 MG tablet; Take 1 tablet (25 mg total) by mouth daily. -     CMP14+EGFR -     Lipid panel Continue amlodipine 7m as prescribed.  Remember to bring in your blood pressure log with you for your follow up appointment.  DASH/Mediterranean Diets are healthier choices for HTN.    Encounter for screening for HIV -     HIV antibody (with reflex)  History of ITP -     CBC with Differential  Lipid screening -     Lipid panel     Follow Up Instructions Return in about 2 weeks (around 09/12/2019) for 2 weeks BP check with LUKE see me in office 4-6 weeks.     I discussed the assessment and treatment plan with the patient. The patient was provided an opportunity to ask questions and all were answered. The patient agreed with the plan and demonstrated an understanding of the instructions.   The patient was advised to call back or seek an in-person evaluation if the symptoms worsen or if the condition fails to improve as anticipated.  I provided 24 minutes of non-face-to-face time during this encounter including median intraservice time, reviewing previous notes, labs, imaging, medications and explaining diagnosis and management.  ZGildardo Pounds FNP-BC

## 2019-08-30 LAB — LIPID PANEL
Chol/HDL Ratio: 2.9 ratio (ref 0.0–5.0)
Cholesterol, Total: 157 mg/dL (ref 100–199)
HDL: 54 mg/dL (ref >39)
LDL Chol Calc (NIH): 93 mg/dL (ref 0–99)
Triglycerides: 46 mg/dL (ref 0–149)
VLDL Cholesterol Cal: 10 mg/dL (ref 5–40)

## 2019-08-30 LAB — CBC WITH DIFFERENTIAL/PLATELET
Basophils Absolute: 0 10*3/uL (ref 0.0–0.2)
Basos: 1 %
EOS (ABSOLUTE): 0.1 10*3/uL (ref 0.0–0.4)
Eos: 2 %
Hematocrit: 46.9 % (ref 37.5–51.0)
Hemoglobin: 16.2 g/dL (ref 13.0–17.7)
Immature Grans (Abs): 0 10*3/uL (ref 0.0–0.1)
Immature Granulocytes: 0 %
Lymphocytes Absolute: 2.2 10*3/uL (ref 0.7–3.1)
Lymphs: 42 %
MCH: 30.1 pg (ref 26.6–33.0)
MCHC: 34.5 g/dL (ref 31.5–35.7)
MCV: 87 fL (ref 79–97)
Monocytes Absolute: 0.3 10*3/uL (ref 0.1–0.9)
Monocytes: 6 %
Neutrophils Absolute: 2.5 10*3/uL (ref 1.4–7.0)
Neutrophils: 49 %
Platelets: 86 10*3/uL — CL (ref 150–450)
RBC: 5.38 x10E6/uL (ref 4.14–5.80)
RDW: 13.2 % (ref 11.6–15.4)
WBC: 5.2 10*3/uL (ref 3.4–10.8)

## 2019-08-30 LAB — CMP14+EGFR
ALT: 18 IU/L (ref 0–44)
AST: 17 IU/L (ref 0–40)
Albumin/Globulin Ratio: 1.7 (ref 1.2–2.2)
Albumin: 4.5 g/dL (ref 4.0–5.0)
Alkaline Phosphatase: 56 IU/L (ref 39–117)
BUN/Creatinine Ratio: 20 (ref 9–20)
BUN: 19 mg/dL (ref 6–20)
Bilirubin Total: 0.3 mg/dL (ref 0.0–1.2)
CO2: 24 mmol/L (ref 20–29)
Calcium: 9.9 mg/dL (ref 8.7–10.2)
Chloride: 102 mmol/L (ref 96–106)
Creatinine, Ser: 0.96 mg/dL (ref 0.76–1.27)
GFR calc Af Amer: 115 mL/min/{1.73_m2} (ref >59)
GFR calc non Af Amer: 100 mL/min/{1.73_m2} (ref >59)
Globulin, Total: 2.6 g/dL (ref 1.5–4.5)
Glucose: 89 mg/dL (ref 65–99)
Potassium: 4.7 mmol/L (ref 3.5–5.2)
Sodium: 140 mmol/L (ref 134–144)
Total Protein: 7.1 g/dL (ref 6.0–8.5)

## 2019-08-30 LAB — HIV ANTIBODY (ROUTINE TESTING W REFLEX): HIV Screen 4th Generation wRfx: NONREACTIVE

## 2019-08-31 ENCOUNTER — Other Ambulatory Visit: Payer: Self-pay | Admitting: Nurse Practitioner

## 2019-08-31 DIAGNOSIS — D693 Immune thrombocytopenic purpura: Secondary | ICD-10-CM

## 2019-09-13 ENCOUNTER — Telehealth: Payer: Self-pay | Admitting: Oncology

## 2019-09-13 NOTE — Telephone Encounter (Signed)
Received a new hem referral from Malachi Pro, NP for dx of Immune thrombocytopenic purpura. MR. Hinch has been cld and scheduled to see Dr. Alen Blew on 9/24 at 11am. He's been made aware to arrive 15 minutes early.

## 2019-09-22 ENCOUNTER — Other Ambulatory Visit: Payer: Self-pay

## 2019-09-22 ENCOUNTER — Inpatient Hospital Stay: Payer: Medicaid Other | Attending: Oncology | Admitting: Oncology

## 2019-09-22 VITALS — BP 145/80 | HR 81 | Temp 98.5°F | Resp 18 | Ht 64.0 in | Wt 176.5 lb

## 2019-09-22 DIAGNOSIS — D696 Thrombocytopenia, unspecified: Secondary | ICD-10-CM

## 2019-09-22 NOTE — Progress Notes (Signed)
Reason for the request:    Thrombocytopenia  HPI: I was asked by Jeremiah Rankins, NP to evaluate Jeremiah Curtis for the evaluation of thrombocytopenia.  He is a 38 year old man with history of thrombocytopenia dating back at least to March 2019.  At that time his platelet count was 79,000.  He is count has fluctuated between 80 and 90,000 without any other abnormalities in his CBC.  His white cell count and hemoglobin has been within normal range.  He has been told about thrombocytopenia dating back to 2018 while he was incarcerated in [place].  He was given the diagnosis of ITP although he was never requiring any treatment.  He denies any hematochezia, melena or epistaxis.  He denies any constitutional symptoms.  He does report irritation in his right eye and visual disturbances at times but no neurological deficits.  He does not report any headaches, blurry vision, syncope or seizures. Does not report any fevers, chills or sweats.  Does not report any cough, wheezing or hemoptysis.  Does not report any chest pain, palpitation, orthopnea or leg edema.  Does not report any nausea, vomiting or abdominal pain.  Does not report any constipation or diarrhea.  Does not report any skeletal complaints.    Does not report frequency, urgency or hematuria.  Does not report any skin rashes or lesions. Does not report any heat or cold intolerance.  Does not report any lymphadenopathy or petechiae.  Does not report any anxiety or depression.  Remaining review of systems is negative.    Past Medical History:  Diagnosis Date  . Back symptoms, other   . Hypertension   . Idiopathic thrombocytopenic purpura (ITP) (HCC)   :  Past Surgical History:  Procedure Laterality Date  . BACK SURGERY    . LEG SURGERY    :   Current Outpatient Medications:  .  amLODipine (NORVASC) 10 MG tablet, Take 1 tablet (10 mg total) by mouth daily., Disp: 30 tablet, Rfl: 2 .  aspirin 81 MG chewable tablet, Chew 81 mg by mouth  daily., Disp: , Rfl:  .  hydrochlorothiazide (HYDRODIURIL) 25 MG tablet, Take 1 tablet (25 mg total) by mouth daily., Disp: 90 tablet, Rfl: 3:  Allergies  Allergen Reactions  . Fish Allergy Anaphylaxis  . Lisinopril     Blurred vision  :  Family History  Problem Relation Age of Onset  . Hypertension Mother   . Hypertension Father   . Diabetes Maternal Grandmother   . Diabetes Paternal Grandmother   :  Social History   Socioeconomic History  . Marital status: Married    Spouse name: Not on file  . Number of children: Not on file  . Years of education: Not on file  . Highest education level: Not on file  Occupational History  . Not on file  Social Needs  . Financial resource strain: Not on file  . Food insecurity    Worry: Not on file    Inability: Not on file  . Transportation needs    Medical: Not on file    Non-medical: Not on file  Tobacco Use  . Smoking status: Former Smoker    Packs/day: 0.40    Types: Cigarettes  . Smokeless tobacco: Never Used  Substance and Sexual Activity  . Alcohol use: Yes    Frequency: Never    Comment: occasionally  . Drug use: No  . Sexual activity: Yes  Lifestyle  . Physical activity    Days per week: Not  on file    Minutes per session: Not on file  . Stress: Not on file  Relationships  . Social Musician on phone: Not on file    Gets together: Not on file    Attends religious service: Not on file    Active member of club or organization: Not on file    Attends meetings of clubs or organizations: Not on file    Relationship status: Not on file  . Intimate partner violence    Fear of current or ex partner: Not on file    Emotionally abused: Not on file    Physically abused: Not on file    Forced sexual activity: Not on file  Other Topics Concern  . Not on file  Social History Narrative  . Not on file  :  Pertinent items are noted in HPI.  Exam: Blood pressure (!) 145/80, pulse 81, temperature 98.5 F  (36.9 C), temperature source Oral, resp. rate 18, height 5\' 4"  (1.626 m), weight 176 lb 8 oz (80.1 kg), SpO2 100 %.    General appearance: alert and cooperative appeared without distress. Head: atraumatic without any abnormalities. Eyes: Right sclera erythema noted. Throat: lips, mucosa, and tongue normal; without oral thrush or ulcers. Resp: clear to auscultation bilaterally without rhonchi, wheezes or dullness to percussion. Cardio: regular rate and rhythm, S1, S2 normal, no murmur, click, rub or gallop GI: soft, non-tender; bowel sounds normal; no masses,  no organomegaly Skin: Skin color, texture, turgor normal. No rashes or lesions Lymph nodes: Cervical, supraclavicular, and axillary nodes normal. Neurologic: Grossly normal without any motor, sensory or deep tendon reflexes. Musculoskeletal: No joint deformity or effusion.   Assessment and Plan:   38 year old with:  1.  Thrombocytopenia dating back to 2018 with platelet count in 2019 has been close to 90,000.  He has been told that he has diagnosis of ITP previously.  The differential diagnosis was discussed today with the patient.  ITP is a certainly a possibility but also congenital thrombocytopenia is a consideration.  Malignant hematological condition pulmonary diseases are considered less likely.  MDS, lymphoproliferative disorder or myeloproliferative disorder considered unlikely.  From a management standpoint, I see no reason for intervention at this time.  Spelt count is adequate without any active bleeding.  If his platelet count drops down below 50,000 trial of steroids may be considered.  2.  Follow-up: I will evaluate him in 6 months for repeat platelet count and reevaluation.   40  minutes was spent with the patient face-to-face today.  More than 50% of time was spent on updating his disease status, treatment options and answering questions regarding future plan of care.      Thank you for the referral. A copy  of this consult has been forwarded to the requesting physician.

## 2019-09-28 ENCOUNTER — Telehealth: Payer: Self-pay | Admitting: Oncology

## 2019-09-28 MED FILL — HYDROCHLOROTHIAZIDE 25 MG T: 25 | 30 days supply | Qty: 30 | Fill #1

## 2019-09-28 MED FILL — ?AMLODIPINE BESYLATE 10 MG: 10 | 30 days supply | Qty: 30 | Fill #1

## 2019-09-28 NOTE — Telephone Encounter (Signed)
Called and left msg. Mailed printout  °

## 2019-09-30 ENCOUNTER — Ambulatory Visit: Payer: Medicaid Other | Admitting: Nurse Practitioner

## 2019-10-10 ENCOUNTER — Other Ambulatory Visit: Payer: Self-pay

## 2019-10-10 ENCOUNTER — Ambulatory Visit (HOSPITAL_BASED_OUTPATIENT_CLINIC_OR_DEPARTMENT_OTHER): Payer: Self-pay | Admitting: Pharmacist

## 2019-10-10 ENCOUNTER — Ambulatory Visit: Payer: Self-pay | Attending: Nurse Practitioner | Admitting: Nurse Practitioner

## 2019-10-10 ENCOUNTER — Encounter: Payer: Self-pay | Admitting: Nurse Practitioner

## 2019-10-10 VITALS — BP 134/86 | HR 86 | Temp 99.1°F | Ht 64.0 in | Wt 180.0 lb

## 2019-10-10 DIAGNOSIS — I10 Essential (primary) hypertension: Secondary | ICD-10-CM

## 2019-10-10 DIAGNOSIS — Z23 Encounter for immunization: Secondary | ICD-10-CM

## 2019-10-10 MED ORDER — HYDROCHLOROTHIAZIDE 25 MG PO TABS: 25.0000 mg | ORAL_TABLET | Freq: Every day | ORAL | 1 refills | Status: DC

## 2019-10-10 MED ORDER — AMLODIPINE BESYLATE 10 MG PO TABS: 10.0000 mg | ORAL_TABLET | Freq: Every day | ORAL | 1 refills | Status: DC

## 2019-10-10 NOTE — Progress Notes (Signed)
Assessment & Plan:  Jeremiah Curtis was seen today for blood pressure check.  Diagnoses and all orders for this visit:  Essential hypertension -     amLODipine (NORVASC) 10 MG tablet; Take 1 tablet (10 mg total) by mouth daily. -     hydrochlorothiazide (HYDRODIURIL) 25 MG tablet; Take 1 tablet (25 mg total) by mouth daily. Continue all antihypertensives as prescribed.  Remember to bring in your blood pressure log with you for your follow up appointment.  DASH/Mediterranean Diets are healthier choices for HTN.    Patient has been counseled on age-appropriate routine health concerns for screening and prevention. These are reviewed and up-to-date. Referrals have been placed accordingly. Immunizations are up-to-date or declined.    Subjective:   Chief Complaint  Patient presents with  . Blood Pressure Check    Pt. is here for blood pressure check.    HPI Jeremiah Curtis 38 y.o. male presents to office today for HTN.  has a past medical history of Back symptoms, other, Hypertension, and Idiopathic thrombocytopenic purpura (ITP) ([place]).   Seeing Oncology for Thrombocytopenia 09-22-2019 Per record review: From a management standpoint, I see no reason for intervention at this time.  Spelt count is adequate without any active bleeding.  If his platelet count drops down below 50,000 trial of steroids may be considered.  2.  Follow-up: I will evaluate him in 6 months for repeat platelet count and reevaluation.   CHRONIC HYPERTENSION Disease Monitoring  Current Medications: Amlodipine 10mg  daily and HCTZ 25 mg daily.   Chest pain: yes, intermittent after eating.    Dyspnea: no   Claudication: no  Medication compliance: yes  Medication Side Effects  Lightheadedness: no   Urinary frequency: no   Edema: no   Impotence: no  Preventitive Healthcare:  Exercise: no   Diet Pattern: diet: general  Salt Restriction:  no  BP Readings from Last 3 Encounters:  10/10/19 134/86  09/22/19 (!)  145/80  08/16/19 (!) 153/98   Review of Systems  Constitutional: Negative for fever, malaise/fatigue and weight loss.  HENT: Negative.  Negative for nosebleeds.   Eyes: Negative.  Negative for blurred vision, double vision and photophobia.  Respiratory: Negative.  Negative for cough and shortness of breath.   Cardiovascular: Positive for chest pain (not currently active.). Negative for palpitations and leg swelling.  Gastrointestinal: Negative.  Negative for heartburn, nausea and vomiting.  Musculoskeletal: Negative.  Negative for myalgias.  Neurological: Negative.  Negative for dizziness, focal weakness, seizures and headaches.  Psychiatric/Behavioral: Negative.  Negative for suicidal ideas.    Past Medical History:  Diagnosis Date  . Back symptoms, other   . Hypertension   . Idiopathic thrombocytopenic purpura (ITP) (HCC)     Past Surgical History:  Procedure Laterality Date  . BACK SURGERY    . LEG SURGERY      Family History  Problem Relation Age of Onset  . Hypertension Mother   . Hypertension Father   . Diabetes Maternal Grandmother   . Diabetes Paternal Grandmother     Social History Reviewed with no changes to be made today.   Outpatient Medications Prior to Visit  Medication Sig Dispense Refill  . amLODipine (NORVASC) 10 MG tablet Take 1 tablet (10 mg total) by mouth daily. 30 tablet 2  . hydrochlorothiazide (HYDRODIURIL) 25 MG tablet Take 1 tablet (25 mg total) by mouth daily. 90 tablet 3  . aspirin 81 MG chewable tablet Chew 81 mg by mouth daily.     No  facility-administered medications prior to visit.     Allergies  Allergen Reactions  . Fish Allergy Anaphylaxis  . Lisinopril     Blurred vision       Objective:    BP 134/86 (BP Location: Left Arm, Patient Position: Sitting, Cuff Size: Large)   Pulse 86   Temp 99.1 F (37.3 C) (Oral)   Ht 5\' 4"  (1.626 m)   Wt 180 lb (81.6 kg)   SpO2 100%   BMI 30.90 kg/m  Wt Readings from Last 3 Encounters:   10/10/19 180 lb (81.6 kg)  09/22/19 176 lb 8 oz (80.1 kg)  06/28/19 171 lb (77.6 kg)    Physical Exam Vitals signs and nursing note reviewed.  Constitutional:      Appearance: He is well-developed.  HENT:     Head: Normocephalic and atraumatic.  Neck:     Musculoskeletal: Normal range of motion.  Cardiovascular:     Rate and Rhythm: Normal rate and regular rhythm.     Heart sounds: Normal heart sounds. No murmur. No friction rub. No gallop.   Pulmonary:     Effort: Pulmonary effort is normal. No tachypnea or respiratory distress.     Breath sounds: Normal breath sounds. No decreased breath sounds, wheezing, rhonchi or rales.  Chest:     Chest wall: No tenderness.  Abdominal:     General: Bowel sounds are normal.     Palpations: Abdomen is soft.  Musculoskeletal: Normal range of motion.  Skin:    General: Skin is warm and dry.  Neurological:     Mental Status: He is alert and oriented to person, place, and time.     Coordination: Coordination normal.  Psychiatric:        Behavior: Behavior normal. Behavior is cooperative.        Thought Content: Thought content normal.        Judgment: Judgment normal.          Patient has been counseled extensively about nutrition and exercise as well as the importance of adherence with medications and regular follow-up. The patient was given clear instructions to go to ER or return to medical center if symptoms don't improve, worsen or new problems develop. The patient verbalized understanding.   Follow-up: Return in about 3 months (around 01/10/2020) for HTN.   03/09/2020, FNP-BC [id number] [facility] and [facility] [place], [place]   10/10/2019, 8:53 AM

## 2019-10-10 NOTE — Progress Notes (Signed)
Patient presents for vaccination against tetanus per orders of Zelda. Consent given. Counseling provided. No contraindications exists. Vaccine administered without incident.   

## 2019-10-10 NOTE — Patient Instructions (Signed)
Heartburn Heartburn is a type of pain or discomfort that can happen in the throat or chest. It is often described as a burning pain. It may also cause a bad, acid-like taste in the mouth. Heartburn may feel worse when you lie down or bend over. It may be worse at night. It may be caused by stomach contents that move back up (reflux) into the tube that connects the mouth with the stomach (esophagus). Follow these instructions at home: Eating and drinking   Avoid certain foods and drinks as told by your doctor. This may include: ? Coffee and tea (with or without caffeine). ? Drinks that have alcohol. ? Energy drinks and sports drinks. ? Carbonated drinks or sodas. ? Chocolate and cocoa. ? Peppermint and mint flavorings. ? Garlic and onions. ? Horseradish. ? Spicy and acidic foods, such as:  Peppers.  Chili powder and curry powder.  Vinegar.  Hot sauces and BBQ sauce. ? Citrus fruit juices and citrus fruits, such as:  Oranges.  Lemons.  Limes. ? Tomato-based foods, such as:  Red sauce and pizza with red sauce.  Chili.  Salsa. ? Fried and fatty foods, such as:  Donuts.  French fries and potato chips.  High-fat dressings. ? High-fat meats, such as:  Hot dogs and sausage.  Rib eye steak.  Ham and bacon. ? High-fat dairy items, such as:  Whole milk.  Butter.  Cream cheese.  Eat small meals often. Avoid eating large meals.  Avoid drinking large amounts of liquid with your meals.  Avoid eating meals during the 2-3 hours before bedtime.  Avoid lying down right after you eat.  Do not exercise right after you eat. Lifestyle      If you are overweight, lose an amount of weight that is healthy for you. Ask your doctor about a safe weight loss goal.  Do not use any products that contain nicotine or tobacco, including cigarettes, e-cigarettes, and chewing tobacco. These can make your symptoms worse. If you need help quitting, ask your doctor.  Wear loose  clothes. Do not wear anything tight around your waist.  Raise (elevate) the head of your bed about 6 inches (15 cm) when you sleep.  Try to lower your stress. If you need help doing this, ask your doctor. General instructions  Pay attention to any changes in your symptoms.  Take over-the-counter and prescription medicines only as told by your doctor. ? Do not take aspirin, ibuprofen, or other NSAIDs unless your doctor says it is okay. ? Stop medicines only as told by your doctor.  Keep all follow-up visits as told by your doctor. This is important. Contact a doctor if:  You have new symptoms.  You lose weight and you do not know why it is happening.  You have trouble swallowing, or it hurts to swallow.  You have wheezing or a cough that keeps happening.  Your symptoms do not get better with treatment.  You have heartburn often for more than 2 weeks. Get help right away if:  You have pain in your arms, neck, jaw, teeth, or back.  You feel sweaty, dizzy, or light-headed.  You have chest pain or shortness of breath.  You throw up (vomit) and your throw up looks like blood or coffee grounds.  Your poop (stool) is bloody or black. These symptoms may represent a serious problem that is an emergency. Do not wait to see if the symptoms will go away. Get medical help right away. Call your local   emergency services (911 in the [place] Do not drive yourself to the hospital. Summary  Heartburn is a type of pain that can happen in the throat or chest. It can feel like a burning pain. It may also cause a bad, acid-like taste in the mouth.  You may need to avoid certain foods and drinks to help your symptoms. Ask your doctor what foods and drinks you should avoid.  Take over-the-counter and prescription medicines only as told by your doctor. Do not take aspirin, ibuprofen, or other NSAIDs unless your doctor told you to do so.  Contact your doctor if your symptoms do not get better or  they get worse. This information is not intended to replace advice given to you by your health care provider. Make sure you discuss any questions you have with your health care provider. Document Released: 08/27/2011 Document Revised: 05/17/2018 Document Reviewed: 05/17/2018 [employer]vier Patient Education  2020 [employer]  

## 2019-11-10 MED FILL — AMLODIPINE BESYLATE 10 MG T: 10 | 30 days supply | Qty: 30 | Fill #2

## 2019-11-10 MED FILL — HYDROCHLOROTHIAZIDE 25 MG T: 25 | 30 days supply | Qty: 30 | Fill #2

## 2019-11-28 ENCOUNTER — Other Ambulatory Visit: Payer: Self-pay

## 2019-11-28 DIAGNOSIS — Z20822 Contact with and (suspected) exposure to covid-19: Secondary | ICD-10-CM

## 2019-11-29 LAB — NOVEL CORONAVIRUS, NAA: SARS-CoV-2, NAA: DETECTED — AB

## 2019-11-30 ENCOUNTER — Emergency Department (HOSPITAL_BASED_OUTPATIENT_CLINIC_OR_DEPARTMENT_OTHER): Payer: Self-pay

## 2019-11-30 ENCOUNTER — Encounter: Payer: Self-pay | Admitting: Nurse Practitioner

## 2019-11-30 ENCOUNTER — Emergency Department (HOSPITAL_BASED_OUTPATIENT_CLINIC_OR_DEPARTMENT_OTHER)
Admission: EM | Admit: 2019-11-30 | Discharge: 2019-11-30 | Disposition: A | Discharge status: Home / Self Care | Payer: Self-pay | Attending: Emergency Medicine | Admitting: Emergency Medicine

## 2019-11-30 ENCOUNTER — Other Ambulatory Visit: Payer: Self-pay

## 2019-11-30 ENCOUNTER — Encounter (HOSPITAL_BASED_OUTPATIENT_CLINIC_OR_DEPARTMENT_OTHER): Payer: Self-pay

## 2019-11-30 DIAGNOSIS — Z7982 Long term (current) use of aspirin: Secondary | ICD-10-CM | POA: Insufficient documentation

## 2019-11-30 DIAGNOSIS — U071 COVID-19: Secondary | ICD-10-CM | POA: Insufficient documentation

## 2019-11-30 DIAGNOSIS — I10 Essential (primary) hypertension: Secondary | ICD-10-CM | POA: Insufficient documentation

## 2019-11-30 DIAGNOSIS — R0789 Other chest pain: Secondary | ICD-10-CM | POA: Insufficient documentation

## 2019-11-30 DIAGNOSIS — Z888 Allergy status to other drugs, medicaments and biological substances status: Secondary | ICD-10-CM | POA: Insufficient documentation

## 2019-11-30 DIAGNOSIS — Z91018 Allergy to other foods: Secondary | ICD-10-CM | POA: Insufficient documentation

## 2019-11-30 DIAGNOSIS — Z720 Tobacco use: Secondary | ICD-10-CM | POA: Insufficient documentation

## 2019-11-30 DIAGNOSIS — Z79899 Other long term (current) drug therapy: Secondary | ICD-10-CM | POA: Insufficient documentation

## 2019-11-30 LAB — COMPREHENSIVE METABOLIC PANEL
ALT: 20 U/L (ref 0–44)
AST: 24 U/L (ref 15–41)
Albumin: 4.3 g/dL (ref 3.5–5.0)
Alkaline Phosphatase: 47 U/L (ref 38–126)
Anion gap: 11 (ref 5–15)
BUN: 16 mg/dL (ref 6–20)
CO2: 22 mmol/L (ref 22–32)
Calcium: 9.4 mg/dL (ref 8.9–10.3)
Chloride: 102 mmol/L (ref 98–111)
Creatinine, Ser: 0.96 mg/dL (ref 0.61–1.24)
GFR calc Af Amer: >60 mL/min (ref >60)
GFR calc non Af Amer: >60 mL/min (ref >60)
Glucose, Bld: 107 mg/dL — ABNORMAL HIGH (ref 70–99)
Potassium: 3.5 mmol/L (ref 3.5–5.1)
Sodium: 135 mmol/L (ref 135–145)
Total Bilirubin: 0.6 mg/dL (ref 0.3–1.2)
Total Protein: 8 g/dL (ref 6.5–8.1)

## 2019-11-30 LAB — CBC WITH DIFFERENTIAL/PLATELET
Abs Immature Granulocytes: 0.01 10*3/uL (ref 0.00–0.07)
Basophils Absolute: 0 10*3/uL (ref 0.0–0.1)
Basophils Relative: 0 %
Eosinophils Absolute: 0 10*3/uL (ref 0.0–0.5)
Eosinophils Relative: 0 %
HCT: 48.9 % (ref 39.0–52.0)
Hemoglobin: 16 g/dL (ref 13.0–17.0)
Immature Granulocytes: 0 %
Lymphocytes Relative: 20 %
Lymphs Abs: 1.5 10*3/uL (ref 0.7–4.0)
MCH: 29.4 pg (ref 26.0–34.0)
MCHC: 32.7 g/dL (ref 30.0–36.0)
MCV: 89.7 fL (ref 80.0–100.0)
Monocytes Absolute: 0.5 10*3/uL (ref 0.1–1.0)
Monocytes Relative: 7 %
Neutro Abs: 5.2 10*3/uL (ref 1.7–7.7)
Neutrophils Relative %: 73 %
Platelets: 84 10*3/uL — ABNORMAL LOW (ref 150–400)
RBC: 5.45 MIL/uL (ref 4.22–5.81)
RDW: 13.4 % (ref 11.5–15.5)
Smear Review: NORMAL
WBC: 7.3 10*3/uL (ref 4.0–10.5)
nRBC: 0 % (ref 0.0–0.2)

## 2019-11-30 LAB — URINALYSIS, ROUTINE W REFLEX MICROSCOPIC
Bilirubin Urine: NEGATIVE
Glucose, UA: NEGATIVE mg/dL
Ketones, ur: NEGATIVE mg/dL
Leukocytes,Ua: NEGATIVE
Nitrite: NEGATIVE
Protein, ur: NEGATIVE mg/dL
Specific Gravity, Urine: 1.01 (ref 1.005–1.030)
pH: 6.5 (ref 5.0–8.0)

## 2019-11-30 LAB — TROPONIN I (HIGH SENSITIVITY)
Troponin I (High Sensitivity): 31 ng/L — ABNORMAL HIGH (ref <18)
Troponin I (High Sensitivity): 32 ng/L — ABNORMAL HIGH (ref <18)

## 2019-11-30 LAB — URINALYSIS, MICROSCOPIC (REFLEX): WBC, UA: NONE SEEN WBC/hpf (ref 0–5)

## 2019-11-30 MED ORDER — ACETAMINOPHEN 500 MG PO TABS
1000.0000 mg | ORAL_TABLET | Freq: Once | ORAL | Status: AC
  Administered 2019-11-30: 16:00:00 1000 mg via ORAL
  Filled 2019-11-30: qty 2

## 2019-11-30 MED ORDER — IOHEXOL 350 MG/ML SOLN
100.0000 mL | Freq: Once | INTRAVENOUS | Status: AC | PRN
  Administered 2019-11-30: 100 mL via INTRAVENOUS

## 2019-11-30 MED ORDER — IOHEXOL 350 MG/ML SOLN: 100.0000 mL | Freq: Once | INTRAVENOUS | Status: DC

## 2019-11-30 NOTE — ED Provider Notes (Signed)
[facility] EMERGENCY DEPARTMENT Provider Note   CSN: [id number] Arrival date & time: 11/30/19  1104     History   Chief Complaint Chief Complaint  Patient presents with  . Blurred Vision  . Chest Pain    HPI Jeremiah Curtis is a 38 y.o. male with a past medical history significant for hypertension and ITP who presents to the ED due to gradual onset of worsening blurred vision that started today.  Patient notes he took his hypertension medication today around 1030 which improved his vision.  Patient notes his right eye is worse than his left eye.  Patient admits to previous episodes of blurry vision and notes it improved when he ate and took his medication.  Patient also tested positive for Covid yesterday.  He admits to loss of smell and loss of taste.  Patient admits to central, constant, non-radiating chest pain he describes as burning and related to his reflux that started last night. Patient admits to intermittent episodes of chest pain over the past few years without any cardiac work-ups. Patient had a cousin on his mother's side that died of a heart attack at age 938. Patient's brother, age 38, also recently passed of cardiac related issues? Patient admits to tobacco use only on the weekend. Patient took Tums last night which improved the burning sensation. Patient denies abdominal pain, nausea, and vomiting.  Past Medical History:  Diagnosis Date  . Back symptoms, other   . Hypertension   . Idiopathic thrombocytopenic purpura (ITP) (HCC)     There are no active problems to display for this patient.   Past Surgical History:  Procedure Laterality Date  . BACK SURGERY    . LEG SURGERY          Home Medications    Prior to Admission medications   Medication Sig Start Date End Date Taking? Authorizing Provider  amLODipine (NORVASC) 10 MG tablet Take 1 tablet (10 mg total) by mouth daily. 10/10/19   Claiborne RiggFleming, Zelda W, NP  aspirin 81 MG chewable tablet Chew 81  mg by mouth daily.    [provider]  hydrochlorothiazide (HYDRODIURIL) 25 MG tablet Take 1 tablet (25 mg total) by mouth daily. 10/10/19   Claiborne RiggFleming, Zelda W, NP    Family History Family History  Problem Relation Age of Onset  . Hypertension Mother   . Hypertension Father   . Diabetes Maternal Grandmother   . Diabetes Paternal Grandmother     Social History Social History   Tobacco Use  . Smoking status: Former Smoker    Packs/day: 0.40    Types: Cigarettes  . Smokeless tobacco: Never Used  Substance Use Topics  . Alcohol use: Yes    Frequency: Never    Comment: occasionally  . Drug use: No     Allergies   Fish allergy and Lisinopril   Review of Systems Review of Systems  Constitutional: Negative for chills and fever.  HENT: Positive for congestion.   Eyes: Positive for visual disturbance. Negative for photophobia.  Respiratory: Positive for cough and shortness of breath.   Cardiovascular: Positive for chest pain.  Gastrointestinal: Negative for abdominal pain, diarrhea, nausea and vomiting.  Musculoskeletal: Positive for back pain.  Neurological: Negative for weakness, numbness and headaches.     Physical Exam Updated Vital Signs BP (!) 155/104 (BP Location: Left Arm)   Pulse (!) 112   Temp 99.8 F (37.7 C) (Oral)   Resp 20   Ht 5\' 5"  (1.651 m)  Wt 80.7 kg   SpO2 100%   BMI 29.62 kg/m   Physical Exam Vitals signs and nursing note reviewed.  Constitutional:      General: He is not in acute distress.    Appearance: He is not ill-appearing.     Comments: Appears to be anxious  HENT:     Head: Normocephalic.  Eyes:     Pupils: Pupils are equal, round, and reactive to light.  Neck:     Musculoskeletal: Normal range of motion and neck supple. No neck rigidity or muscular tenderness.  Cardiovascular:     Rate and Rhythm: Normal rate and regular rhythm.     Pulses: Normal pulses.     Heart sounds: Normal heart sounds. No murmur. No friction  rub. No gallop.   Pulmonary:     Effort: Pulmonary effort is normal.     Breath sounds: Normal breath sounds.     Comments: Respirations equal and unlabored, patient able to speak in full sentences, lungs clear to auscultation bilaterally Abdominal:     General: Abdomen is flat. Bowel sounds are normal. There is no distension.     Palpations: Abdomen is soft.     Tenderness: There is no abdominal tenderness. There is no guarding or rebound.  Musculoskeletal:     Right lower leg: No edema.     Left lower leg: No edema.     Comments: Able to move all 4 extremities without difficulty. No lower extremity edema bilaterally. Negative Homans sign. Distal sensation and pulses intact bilaterally.  Skin:    General: Skin is warm and dry.  Neurological:     General: No focal deficit present.     Mental Status: He is alert.     Comments: Speech is clear, able to follow commands CN III-XII intact Normal strength in upper and lower extremities bilaterally including dorsiflexion and plantar flexion, strong and equal grip strength Sensation grossly intact throughout Moves extremities without ataxia, coordination intact No pronator drift Ambulates without difficulty       ED Treatments / Results  Labs (all labs ordered are listed, but only abnormal results are displayed) Labs Reviewed  CBC WITH DIFFERENTIAL/PLATELET - Abnormal; Notable for the following components:      Result Value   Platelets 84 (*)    All other components within normal limits  COMPREHENSIVE METABOLIC PANEL - Abnormal; Notable for the following components:   Glucose, Bld 107 (*)    All other components within normal limits  URINALYSIS, ROUTINE W REFLEX MICROSCOPIC - Abnormal; Notable for the following components:   Hgb urine dipstick TRACE (*)    All other components within normal limits  URINALYSIS, MICROSCOPIC (REFLEX) - Abnormal; Notable for the following components:   Bacteria, UA RARE (*)    All other components  within normal limits  TROPONIN I (HIGH SENSITIVITY) - Abnormal; Notable for the following components:   Troponin I (High Sensitivity) 31 (*)    All other components within normal limits  TROPONIN I (HIGH SENSITIVITY) - Abnormal; Notable for the following components:   Troponin I (High Sensitivity) 32 (*)    All other components within normal limits    EKG EKG Interpretation  Date/Time:  Wednesday November 30 2019 11:30:10 EST Ventricular Rate:  109 PR Interval:  120 QRS Duration: 92 QT Interval:  312 QTC Calculation: 420 R Axis:   91 Text Interpretation: Sinus tachycardia Rightward axis Left ventricular hypertrophy with repolarization abnormality ( Cornell product ) Abnormal ECG No significant change  since last tracing Confirmed by Adam, CuVirgina [phone]) on 11/30/2019 12:20:28 PM   Radiology Ct Angio Chest Pe W And/or Wo Contrast  Result Date: 11/30/2019 CLINICAL DATA:  38 year old male with blurred vision and intermittent chest pain. Positive COVID-19. EXAM: CT ANGIOGRAPHY CHEST WITH CONTRAST TECHNIQUE: Multidetector CT imaging of the chest was performed using the standard protocol during bolus administration of intravenous contrast. Multiplanar CT image reconstructions and MIPs were obtained to evaluate the vascular anatomy. CONTRAST:  OMNIPAQUE IOHEXOL 350 MG/ML SOLN COMPARISON:  Chest radiograph dated 11/30/2019. FINDINGS: Cardiovascular: There is no cardiomegaly or pericardial effusion. The thoracic aorta is unremarkable. Evaluation of the pulmonary arteries is very limited due to respiratory motion artifact as well as suboptimal opacification and timing of the contrast. No definite large central pulmonary artery embolus identified. V/Q scan may provide better evaluation if there is high clinical concern for acute PE. Mediastinum/Nodes: There is no hilar or mediastinal adenopathy. The esophagus and thyroid gland are grossly unremarkable. No mediastinal fluid collection.  Lungs/Pleura: The lungs are clear. There is no pleural effusion or pneumothorax. The central airways are patent. Upper Abdomen: Small hepatic hypodense lesions are not characterized but likely represent cysts. The visualized upper abdomen is otherwise unremarkable. Musculoskeletal: No chest wall abnormality. No acute or significant osseous findings. Review of the MIP images confirms the above findings. IMPRESSION: No acute intrathoracic pathology. No CT evidence of central pulmonary artery embolus. Electronically Signed   By: Elgie Collard M.D.   On: 11/30/2019 17:47   Dg Chest Portable 1 View  Result Date: 11/30/2019 CLINICAL DATA:  Chest pain EXAM: PORTABLE CHEST 1 VIEW COMPARISON:  March 31, 2019 FINDINGS: Lungs are clear. Heart size and pulmonary vascularity are normal. No adenopathy. No pneumothorax. No bone lesions. IMPRESSION: No edema or consolidation. Electronically Signed   By: Bretta Bang III M.D.   On: 11/30/2019 12:13    Procedures Procedures (including critical care time)  Medications Ordered in ED Medications  acetaminophen (TYLENOL) tablet 1,000 mg (1,000 mg Oral Given 11/30/19 1543)  iohexol (OMNIPAQUE) 350 MG/ML injection 100 mL (100 mLs Intravenous Contrast Given 11/30/19 1727)     Initial Impression / Assessment and Plan / ED Course  I have reviewed the triage vital signs and the nursing notes.  Pertinent labs & imaging results that were available during my care of the patient were reviewed by me and considered in my medical decision making (see chart for details).  Clinical Course as of Nov 30 2119  Wed Nov 30, 2019  1508 Spoke to Dr. Jacinto Halim who recommends trending troponin and if it doesn't increase by 20% outpatient follow-up   [CC]    Clinical Course User Index [CC] Renee Harder, PA-C       38 year old male presents to the ED for an evaluation of blurry vision and chest pain.  Patient tested positive for Covid yesterday.  Upon arrival to the ED  patient is tachycardic at 112, low grade fever at 99.8, with blood pressure at 155/104. Will continue to monitor. Patient in no acute distress and non-ill appearing. Patient appears anxious. Lungs clears to auscultation bilaterally. Normal heart sounds. No lower extremity edema. Negative homans sign bilaterally. Neurovascularly intact. No concern for CVA. Good visual acuity. Appears symptoms of chest pain and blurry vision are more chronic in nature. Low suspicion for ACS. Patient is low risk for PE. Will continue to monitor tachycardia which is likely due to fever and COVID. Patient is not hypoxic. Given patient's elevated blood  pressure will order routine labs, troponin, and UA to check for organ damage.  EKG personally reviewed which is significant for sinus tachycardia with T wave rsions in inferior leads. No changes from prior EKGs. CXR reviewed which is negative for signs of infection or cardiomegaly. CMP reassuring with normal renal function and mild hyperglycemia at 107. CBC significant for thrombocytopenia likely due to history of ITP. No leukocytosis. UA negative for signs of infection, but trace hematuria. Initial troponin elevated at 31. Will obtain serial troponin. Will consult cardiology given elevated troponin.  Serial troponin elevated at 32. See consult note above.   3:33 PM reassessed patient and discussed lab results and reviewed the plan. Given patient has been tachycardic, will order CTA to rule out PE. Tylenol given. Patient notes his heart is racing because he is extremely nervous and anxious.   Heart pathway score: 2  CTA personally reviewed which is negative for PE. Will discharge patient home with close cardiology follow-up. Patient given Dr. Verl Dicker number at discharge and advised to call tomorrow to schedule an appointment. Patient has been instructed to self-quarantine away from others. Patient advised to take over the counter Tylenol as needed for fever. Strict ED precautions  discussed with patient. Patient states understanding and agrees to plan. Patient discharged home in no acute distress and stable vitals     Final Clinical Impressions(s) / ED Diagnoses   Final diagnoses:  COVID-19 virus infection  Atypical chest pain    ED Discharge Orders    None       Lorelle Formosa 11/30/19 2120    Virgina Norfolk, DO 12/01/19 7510

## 2019-11-30 NOTE — Discharge Instructions (Addendum)
As discussed, I have given you the number for the cardiologist. Call tomorrow morning to schedule an appointment. He already knows about your case. COVID is a viral infection, so no antibiotics are indicated. You may take over the counter tylenol as needed for fever. Continue to self quarantine away from others. Follow-up with your PCP within the next week if your symptoms do not improve. Return to the ER for worsening chest pain that radiates to left arm/jaw, severe shortness of breath, or worsening of symptoms.

## 2019-11-30 NOTE — ED Triage Notes (Addendum)
Pt c/o blurred vision x today-intermittent CP x 2 days-states he tested +covid this week-states he advised PCP and was told "they would get back to me in 53 hrs"-pt texting and using face time on phone in triage without difficulty-NAD-steady gait

## 2019-11-30 NOTE — ED Provider Notes (Addendum)
Medical screening examination/treatment/procedure(s) were conducted as a shared visit with non-physician practitioner(s) and myself.  I personally evaluated the patient during the encounter. Briefly, the patient is a 38 y.o. male with history of hypertension who presents to the ED with chest pain, blurry vision, Covid.  Patient with tachycardia, mild hypertension, temperature 100.1.  Patient states he has had increasing chest pain over the last several days with intermittent blurry vision.  He has had chest pain and blurry vision for the last several months.  He just happens also have tested positive for coronavirus this past week as he had a close contact and went to get tested.  He has not had much coughing.  No shortness of breath.  Has not noticed a fever.  Patient has EKG that shows sinus tachycardia with signs of T wave inversions inferiorly which have been on prior EKGs.  Patient otherwise appears well.  No risk factors for PE.  Patient with labs and troponin ordered.  Patient had mild elevation of troponin to 31.  However chest x-ray showed no infectious findings.  No cardiomegaly.  Patient otherwise with no significant anemia, electrolyte abnormality, kidney injury.  Urinalysis negative for infection.  Patient neurologically intact.  Overall normal visual acuity.  Does not have any blurred vision currently on exam.  He states that he typically has blurred vision when he has high blood pressure but then it quickly goes away.  Overall this appears chronic and can have him follow-up with ophthalmology.  However given chest pain and mildly positive troponin will touch base with cardiology.  He does not know of any specific cardiac history in the family but he states that his brother recently died of some unknown event, possibly cardiac in nature.  My physician assistant will touch base with cardiology for further recommendations.  Not sure if chest pain is particularly related to coronavirus.  Seems unlikely to  be myocarditis or pericarditis. Overall, suspect chronic process. Given mild tachycardia, will get PE scan as well.  This chart was dictated using voice recognition software.  Despite best efforts to proofread,  errors can occur which can change the documentation meaning.     EKG Interpretation  Date/Time:  Wednesday November 30 2019 11:30:10 EST Ventricular Rate:  109 PR Interval:  120 QRS Duration: 92 QT Interval:  312 QTC Calculation: 420 R Axis:   91 Text Interpretation: Sinus tachycardia Rightward axis Left ventricular hypertrophy with repolarization abnormality ( Cornell product ) Abnormal ECG No significant change since last tracing Confirmed by Lennice Sites [phone]) on 11/30/2019 12:20:28 PM           Lennice Sites, DO 11/30/19 [place], [place], DO 11/30/19 1530

## 2019-11-30 NOTE — Telephone Encounter (Signed)
Patient called asking for a call back to talk about his covid test

## 2019-12-01 ENCOUNTER — Telehealth (HOSPITAL_COMMUNITY): Payer: Self-pay | Admitting: Critical Care Medicine

## 2019-12-01 NOTE — Telephone Encounter (Signed)
-----   Message from Elliot Cousin, RN sent at 11/30/2019  3:14 PM EST ----- Positive Result

## 2019-12-01 NOTE — Telephone Encounter (Signed)
I spoke to this patient who tested positive for Covid from 11/30 event.    He is having cough, body aches, fever, fatigue, no taste/smell.  He did go to ED for chest pain 12/2.  CT angio neg for PE or PNA d/t Covid.   He was sent home with supportive care  As he is 38yo his HTN dx does not qualify him for mAB infusion at this time.   He is not obese and does not have T2DM or immunosuppressed state .   His girl friend tested negative and now has sxs.  I told her she should get retested this week.  Both are in isolation and know HD may contact them.  I advised the pt to f/u with Geryl Rankins [facility] for other ?s and follow up

## 2019-12-02 NOTE — Telephone Encounter (Signed)
Noted. Thank you Dr. Joya Gaskins.

## 2019-12-12 NOTE — Progress Notes (Signed)
Patient referred by [name], NP for chest pain  Subjective:   [name], male    DOB: [birth year], 38 y.o.   MRN: [id number]   Chief Complaint  Patient presents with  . Chest Pain  . New Patient (Initial Visit)     HPI  38 year old African-American man with hypertension, recent history of COVID in early Dec 2020, concurrent blurry vision chest pain.  Patient was seen in the emergency department on 11/30/2019 with the symptoms, along with blood pressure of 130 had just tested positive for Covid.  High-sensitivity troponin was mildly elevated at 31.  He is referred for further evaluation by cardiology.  Patient has been having chest pains even prior to his COVID infection. Episodes are retrosternal, lasting for a few seconds, unrelated to exertion. He denies any obvious shortness of breath.   Patient has had hypertension for a long time. He is compliant with his antihypertensive medications. He reports feeling stressed and anxious today.   Patient has family h/o CAD, with a cousin of his dying of heart attack at age 76. Patient also lost a brother recently after a cardiac arrest, although this was in the setting of COVID.     Past Medical History:  Diagnosis Date  . Back symptoms, other   . Hypertension   . Idiopathic thrombocytopenic purpura (ITP) (HCC)      Past Surgical History:  Procedure Laterality Date  . BACK SURGERY    . LEG SURGERY       Social History   Tobacco Use  Smoking Status Former Smoker  . Packs/day: 0.40  . Types: Cigarettes  Smokeless Tobacco Never Used    Social History   Substance and Sexual Activity  Alcohol Use Yes   Comment: occasionally     Family History  Problem Relation Age of Onset  . Hypertension Mother   . Hypertension Father   . Diabetes Maternal Grandmother   . Diabetes Paternal Grandmother      Current Outpatient Medications on File Prior to Visit  Medication Sig Dispense Refill  . amLODipine  (NORVASC) 10 MG tablet Take 1 tablet (10 mg total) by mouth daily. 90 tablet 1  . aspirin 81 MG chewable tablet Chew 81 mg by mouth daily.    . hydrochlorothiazide (HYDRODIURIL) 25 MG tablet Take 1 tablet (25 mg total) by mouth daily. 90 tablet 1  . Multiple Vitamin (MULTIVITAMIN WITH MINERALS) TABS tablet Take 1 tablet by mouth daily.     No current facility-administered medications on file prior to visit.    Cardiovascular and other pertinent studies:   EKG 12/02/2019: Sinus rhythm 85 bpm. Borderline for LVH. Inferolateral T wave inversion, possibly due to LVH. Uncahnged compared to previous EKG's.   Recent labs: 11/30/2019: Glucose 107, BUN/Cr 16/0.96. EGFR >60. Na/K 135/3.5. Rest of the CMP normal H/H 16/48. MCV 89.7. Platelets 84 Results for REACE, BRESHEARS (MRN [id number]) as of 12/21/2019 08:13  Ref. Range 11/30/2019 12:26 11/30/2019 14:53  Troponin I (High Sensitivity) Latest Ref Range: <18 ng/L 31 (H) 32 (H)      Review of Systems  Constitution: Negative for decreased appetite, malaise/fatigue, weight gain and weight loss.  HENT: Negative for congestion.   Eyes: Negative for visual disturbance.  Cardiovascular: Positive for chest pain. Negative for dyspnea on exertion, leg swelling, palpitations and syncope.  Respiratory: Negative for cough.   Endocrine: Negative for cold intolerance.  Hematologic/Lymphatic: Does not bruise/bleed easily.  Skin: Negative for itching and rash.  Musculoskeletal: Negative  for myalgias.  Gastrointestinal: Negative for abdominal pain, nausea and vomiting.  Genitourinary: Negative for dysuria.  Neurological: Negative for dizziness and weakness.  Psychiatric/Behavioral: The patient is nervous/anxious.   All other systems reviewed and are negative.        Vitals:   12/21/19 0902 12/21/19 0910  BP: (!) 146/97 (!) 161/101  Pulse: 88 98  Temp: 98.3 F (36.8 C)   SpO2: 99%      Body mass index is 30.45 kg/m. Filed Weights    12/21/19 0902  Weight: 183 lb (83 kg)     Objective:   Physical Exam  Constitutional: He is oriented to person, place, and time. He appears well-developed and well-nourished. No distress.  HENT:  Head: Normocephalic and atraumatic.  Eyes: Pupils are equal, round, and reactive to light. Conjunctivae are normal.  Neck: No JVD present.  Cardiovascular: Normal rate, regular rhythm and intact distal pulses.  No murmur heard. Pulmonary/Chest: Effort normal and breath sounds normal. He has no wheezes. He has no rales.  Abdominal: Soft. Bowel sounds are normal. There is no rebound.  Musculoskeletal:        General: No edema.  Lymphadenopathy:    He has no cervical adenopathy.  Neurological: He is alert and oriented to person, place, and time. No cranial nerve deficit.  Skin: Skin is warm and dry.  Psychiatric: He has a normal mood and affect.  Nursing note and vitals reviewed.        Assessment & Recommendations:    38 year old African-American man with hypertension, recent history of COVID in early Dec 2020, concurrent blurry vision chest pain.  Chest pain: Atypical. However, he has risk factors for CAD with hypertension, family h/o CAD, recent elevated HS troponin without ACS. Resting EKG is abnormal, likely due to hypertension, Recommend echocardiogram, lexiscan nuclear stress test and calcium score.  Hypertension: Elevated today. Will recheck at next visit.   Thank you for referring the patient to [place]. Please feel free to contact with any questions.  Nigel Mormon, MD [facility]. [place] Pager: [phone] Office: [phone]

## 2019-12-15 MED FILL — HYDROCHLOROTHIAZIDE 25 MG T: 25 | 30 days supply | Qty: 30 | Fill #3

## 2019-12-15 MED FILL — AMLODIPINE BESYLATE 10 MG T: 10 | 30 days supply | Qty: 30 | Fill #0

## 2019-12-21 ENCOUNTER — Other Ambulatory Visit: Payer: Self-pay

## 2019-12-21 ENCOUNTER — Encounter: Payer: Self-pay | Admitting: Cardiology

## 2019-12-21 ENCOUNTER — Ambulatory Visit (INDEPENDENT_AMBULATORY_CARE_PROVIDER_SITE_OTHER): Payer: Self-pay | Admitting: Cardiology

## 2019-12-21 VITALS — BP 161/101 | HR 98 | Temp 98.3°F | Ht 65.0 in | Wt 183.0 lb

## 2019-12-21 DIAGNOSIS — Z8249 Family history of ischemic heart disease and other diseases of the circulatory system: Secondary | ICD-10-CM

## 2019-12-21 DIAGNOSIS — R0789 Other chest pain: Secondary | ICD-10-CM

## 2019-12-21 DIAGNOSIS — I10 Essential (primary) hypertension: Secondary | ICD-10-CM

## 2019-12-26 ENCOUNTER — Ambulatory Visit: Payer: Self-pay | Admitting: Cardiology

## 2019-12-28 ENCOUNTER — Ambulatory Visit (INDEPENDENT_AMBULATORY_CARE_PROVIDER_SITE_OTHER): Payer: Self-pay

## 2019-12-28 ENCOUNTER — Other Ambulatory Visit: Payer: Self-pay

## 2019-12-28 DIAGNOSIS — R0789 Other chest pain: Secondary | ICD-10-CM

## 2019-12-28 DIAGNOSIS — Z8249 Family history of ischemic heart disease and other diseases of the circulatory system: Secondary | ICD-10-CM

## 2019-12-29 NOTE — Progress Notes (Signed)
Called patient. NA. Someone picked up phone but did not speak.

## 2020-01-02 ENCOUNTER — Other Ambulatory Visit: Payer: Medicaid Other

## 2020-01-05 ENCOUNTER — Other Ambulatory Visit: Payer: Self-pay

## 2020-01-05 ENCOUNTER — Ambulatory Visit (INDEPENDENT_AMBULATORY_CARE_PROVIDER_SITE_OTHER): Payer: Self-pay

## 2020-01-05 DIAGNOSIS — R0789 Other chest pain: Secondary | ICD-10-CM

## 2020-01-05 DIAGNOSIS — I10 Essential (primary) hypertension: Secondary | ICD-10-CM

## 2020-01-05 DIAGNOSIS — Z8249 Family history of ischemic heart disease and other diseases of the circulatory system: Secondary | ICD-10-CM

## 2020-01-10 ENCOUNTER — Encounter: Payer: Self-pay | Admitting: Nurse Practitioner

## 2020-01-10 ENCOUNTER — Ambulatory Visit: Payer: Self-pay | Attending: Nurse Practitioner | Admitting: Nurse Practitioner

## 2020-01-10 ENCOUNTER — Other Ambulatory Visit: Payer: Self-pay

## 2020-01-10 DIAGNOSIS — Z91013 Allergy to seafood: Secondary | ICD-10-CM

## 2020-01-10 DIAGNOSIS — I10 Essential (primary) hypertension: Secondary | ICD-10-CM

## 2020-01-10 MED ORDER — AMLODIPINE BESYLATE 10 MG PO TABS: 10.0000 mg | ORAL_TABLET | Freq: Every day | ORAL | 1 refills | Status: DC

## 2020-01-10 MED ORDER — HYDROCHLOROTHIAZIDE 25 MG PO TABS: 25.0000 mg | ORAL_TABLET | Freq: Every day | ORAL | 1 refills | Status: DC

## 2020-01-10 MED ORDER — EPINEPHRINE 0.3 MG/0.3ML IJ SOAJ: 0.3000 mg | INTRAMUSCULAR | 1 refills | Status: DC | PRN

## 2020-01-10 MED FILL — HYDROCHLOROTHIAZIDE 25 MG T: 25 | 30 days supply | Qty: 30 | Fill #0

## 2020-01-10 MED FILL — ?EPINOPHRINE 0.3MG AUTO-INJ: 0.3 | 2 days supply | Qty: 2 | Fill #0

## 2020-01-10 MED FILL — AMLODIPINE BESYLATE 10 MG T: 10 | 30 days supply | Qty: 30 | Fill #0

## 2020-01-10 NOTE — Progress Notes (Signed)
Virtual Visit via Telephone Note Due to national recommendations of social distancing due to [place] 19, telehealth visit is felt to be most appropriate for this patient at this time.  I discussed the limitations, risks, security and privacy concerns of performing an evaluation and management service by telephone and the availability of in person appointments. I also discussed with the patient that there may be a patient responsible charge related to this service. The patient expressed understanding and agreed to proceed.    I connected with [name] on 01/10/20  at   8:30 AM EST  EDT by telephone and verified that I am speaking with the correct person using two identifiers.   Consent I discussed the limitations, risks, security and privacy concerns of performing an evaluation and management service by telephone and the availability of in person appointments. I also discussed with the patient that there may be a patient responsible charge related to this service. The patient expressed understanding and agreed to proceed.   Location of Patient: Private  Residence   Location of Provider: [place] participating in Telemedicine visit: Geryl Rankins FNP-BC [place]    History of Present Illness: Telemedicine visit for: F/U HTN Seeing Oncology for thrombocytopenia.  Next Appt 03-20-2019  Essential Hypertension Currently taking amlodipine 10 mg and HCTZ 25 mg.  He is monitoring his blood pressure with most recent blood pressure reading a few days ago 135/90.  We discussed dietary and exercise modifications in order to keep from having to add on an additional antihypertensive.  He verbalized understanding. Denies chest pain, shortness of breath, palpitations, lightheadedness, dizziness, headaches or BLE edema.  BP Readings from Last 3 Encounters:  12/21/19 (!) 161/101  11/30/19 130/87  10/10/19 134/86   135/90  Past  Medical History:  Diagnosis Date  . Back symptoms, other   . Hypertension   . Idiopathic thrombocytopenic purpura (ITP) (HCC)     Past Surgical History:  Procedure Laterality Date  . BACK SURGERY    . LEG SURGERY      Family History  Problem Relation Age of Onset  . Hypertension Mother   . Hypertension Father   . Diabetes Maternal Grandmother   . Diabetes Paternal Grandmother     Social History   Socioeconomic History  . Marital status: Married    Spouse name: Not on file  . Number of children: Not on file  . Years of education: Not on file  . Highest education level: Not on file  Occupational History  . Not on file  Tobacco Use  . Smoking status: Former Smoker    Packs/day: 0.40    Types: Cigarettes  . Smokeless tobacco: Never Used  Substance and Sexual Activity  . Alcohol use: Yes    Comment: occasionally  . Drug use: No  . Sexual activity: Not on file  Other Topics Concern  . Not on file  Social History Narrative  . Not on file   Social Determinants of Health   Financial Resource Strain:   . Difficulty of Paying Living Expenses: Not on file  Food Insecurity:   . Worried About Charity fundraiser in the Last Year: Not on file  . Ran Out of Food in the Last Year: Not on file  Transportation Needs:   . Lack of Transportation (Medical): Not on file  . Lack of Transportation (Non-Medical): Not on file  Physical Activity:   . Days of Exercise  per Week: Not on file  . Minutes of Exercise per Session: Not on file  Stress:   . Feeling of Stress : Not on file  Social Connections:   . Frequency of Communication with Friends and Family: Not on file  . Frequency of Social Gatherings with Friends and Family: Not on file  . Attends Religious Services: Not on file  . Active Member of Clubs or Organizations: Not on file  . Attends Banker Meetings: Not on file  . Marital Status: Not on file     Observations/Objective: Awake, alert and oriented x  3   Review of Systems  Constitutional: Negative for fever, malaise/fatigue and weight loss.  HENT: Negative.  Negative for nosebleeds.   Eyes: Negative.  Negative for blurred vision, double vision and photophobia.  Respiratory: Negative.  Negative for cough and shortness of breath.   Cardiovascular: Negative.  Negative for chest pain, palpitations and leg swelling.  Gastrointestinal: Negative.  Negative for heartburn, nausea and vomiting.  Musculoskeletal: Negative.  Negative for myalgias.  Neurological: Negative.  Negative for dizziness, focal weakness, seizures and headaches.  Psychiatric/Behavioral: Negative.  Negative for suicidal ideas.    Assessment and Plan: Shanan was seen today for follow-up.  Diagnoses and all orders for this visit:  Essential hypertension -     amLODipine (NORVASC) 10 MG tablet; Take 1 tablet (10 mg total) by mouth daily. -     hydrochlorothiazide (HYDRODIURIL) 25 MG tablet; Take 1 tablet (25 mg total) by mouth daily. Continue all antihypertensives as prescribed.  Remember to bring in your blood pressure log with you for your follow up appointment.  DASH/Mediterranean Diets are healthier choices for HTN.    Allergic to shellfish -     EPINEPHrine 0.3 mg/0.3 mL IJ SOAJ injection; Inject 0.3 mLs (0.3 mg total) into the muscle as needed for anaphylaxis.     Follow Up Instructions Return in about 3 months (around 04/09/2020).     I discussed the assessment and treatment plan with the patient. The patient was provided an opportunity to ask questions and all were answered. The patient agreed with the plan and demonstrated an understanding of the instructions.   The patient was advised to call back or seek an in-person evaluation if the symptoms worsen or if the condition fails to improve as anticipated.  I provided 14 minutes of non-face-to-face time during this encounter including median intraservice time, reviewing previous notes, labs, imaging,  medications and explaining diagnosis and management.  Claiborne Rigg, FNP-BC

## 2020-01-16 ENCOUNTER — Encounter (HOSPITAL_COMMUNITY): Payer: Self-pay

## 2020-01-16 ENCOUNTER — Ambulatory Visit (HOSPITAL_COMMUNITY)
Admission: EM | Admit: 2020-01-16 | Discharge: 2020-01-16 | Disposition: A | Discharge status: Home / Self Care | Payer: Medicaid Other | Attending: Family Medicine | Admitting: Family Medicine

## 2020-01-16 ENCOUNTER — Other Ambulatory Visit: Payer: Self-pay

## 2020-01-16 DIAGNOSIS — K2900 Acute gastritis without bleeding: Secondary | ICD-10-CM

## 2020-01-16 DIAGNOSIS — R1013 Epigastric pain: Secondary | ICD-10-CM

## 2020-01-16 MED ORDER — ALUM & MAG HYDROXIDE-SIMETH 200-200-20 MG/5ML PO SUSP: 15.0000 mL | Freq: Four times a day (QID) | ORAL | 0 refills | Status: DC | PRN

## 2020-01-16 MED ORDER — ONDANSETRON 4 MG PO TBDP: 4.0000 mg | ORAL_TABLET | Freq: Three times a day (TID) | ORAL | 0 refills | Status: DC | PRN

## 2020-01-16 MED ORDER — OMEPRAZOLE 20 MG PO CPDR: 20.0000 mg | DELAYED_RELEASE_CAPSULE | Freq: Two times a day (BID) | ORAL | 0 refills | Status: DC

## 2020-01-16 MED FILL — ONDANSETRON ODT 4 MG TABLET: 4 | 6 days supply | Qty: 20 | Fill #0

## 2020-01-16 MED FILL — ?OMEPRAZOLE 20MG CAP DR: 20 | 14 days supply | Qty: 28 | Fill #0

## 2020-01-16 NOTE — ED Triage Notes (Signed)
Pt C/o abdominal pain that started Friday with some nausea. He states the pain comes and goes. He denies any other symptoms at this time.

## 2020-01-16 NOTE — Discharge Instructions (Signed)
Please begin taking omeprazole twice daily before meals for the next 2 weeks Use Zofran as needed for nausea May supplement with Maalox as needed for discomfort as omeprazole begins to work Please read attached and avoid problematic/trigger foods  Please follow-up here or emergency room if developing persistent or worsening pain, vomiting, fevers

## 2020-01-16 NOTE — ED Provider Notes (Signed)
[place]    CSN: [id number] Arrival date & time: 01/16/20  1020      History   Chief Complaint Chief Complaint  Patient presents with  . Abdominal Pain    HPI Jeremiah Curtis is a 39 y.o. male history of hypertension, previous ITP, presenting today for evaluation of abdominal pain.  Patient states that after a night of drinking and eating [place] he developed some uneasiness in his stomach with cramping and twisting sensation.  He had associated nausea.  Took a laxative which caused some diarrhea, but this has resolved.  His bowels have returned to normal, last bowel movement was yesterday and was formed.  He has also tried Tums and Imodium without relief.  Pain is intermittent.  Notes that he has been still tolerating oral intake.  Symptoms were okay with eating cookout, but worsened with lasagna.  Denies chest pain or shortness of breath.  Denies prior abdominal surgeries.  Denies any urinary symptoms.  Denies any current pain at time of visit.  Reports heavier alcohol use on the weekends, denies drinking during the week.  HPI  Past Medical History:  Diagnosis Date  . Back symptoms, other   . Hypertension   . Idiopathic thrombocytopenic purpura (ITP) [facility])     Patient Active Problem List   Diagnosis Date Noted  . Atypical chest pain 12/21/2019  . Family history of early CAD 12/21/2019  . Essential hypertension 12/21/2019    Past Surgical History:  Procedure Laterality Date  . BACK SURGERY    . LEG SURGERY         Home Medications    Prior to Admission medications   Medication Sig Start Date End Date Taking? Authorizing Provider  alum & mag hydroxide-simeth (MAALOX/MYLANTA) 200-200-20 MG/5ML suspension Take 15 mLs by mouth every 6 (six) hours as needed for indigestion or heartburn. 01/16/20   Jeremiah Moors C, PA-Curtis  amLODipine (NORVASC) 10 MG tablet Take 1 tablet (10 mg total) by mouth daily. 01/10/20   Jeremiah Pounds, NP  aspirin 81 MG  chewable tablet Chew 81 mg by mouth daily.    [provider]  EPINEPHrine 0.3 mg/0.3 mL IJ SOAJ injection Inject 0.3 mLs (0.3 mg total) into the muscle as needed for anaphylaxis. 01/10/20   Jeremiah Pounds, NP  hydrochlorothiazide (HYDRODIURIL) 25 MG tablet Take 1 tablet (25 mg total) by mouth daily. 01/10/20   Jeremiah Pounds, NP  Multiple Vitamin (MULTIVITAMIN WITH MINERALS) TABS tablet Take 1 tablet by mouth daily.    [provider]  omeprazole (PRILOSEC) 20 MG capsule Take 1 capsule (20 mg total) by mouth 2 (two) times daily before a meal for 14 days. 01/16/20 01/30/20  Jeremiah Shader C, PA-Curtis  ondansetron (ZOFRAN ODT) 4 MG disintegrating tablet Take 1 tablet (4 mg total) by mouth every 8 (eight) hours as needed for nausea or vomiting. 01/16/20   Jeremiah Bachar, Elesa Hacker, PA-Curtis    Family History Family History  Problem Relation Age of Onset  . Hypertension Mother   . Hypertension Father   . Diabetes Maternal Grandmother   . Diabetes Paternal Grandmother     Social History Social History   Tobacco Use  . Smoking status: Former Smoker    Packs/day: 0.40    Types: Cigarettes  . Smokeless tobacco: Never Used  Substance Use Topics  . Alcohol use: Yes    Comment: occasionally  . Drug use: No     Allergies   Fish allergy and Lisinopril  Review of Systems Review of Systems  Constitutional: Negative for activity change, appetite change, chills, fatigue and fever.  HENT: Negative for congestion, ear pain, rhinorrhea, sinus pressure, sore throat and trouble swallowing.   Eyes: Negative for discharge and redness.  Respiratory: Negative for cough, chest tightness and shortness of breath.   Cardiovascular: Negative for chest pain.  Gastrointestinal: Positive for abdominal pain and nausea. Negative for diarrhea and vomiting.  Genitourinary: Negative for decreased urine volume, difficulty urinating and dysuria.  Musculoskeletal: Negative for myalgias.  Skin: Negative for  rash.  Neurological: Negative for dizziness, light-headedness and headaches.     Physical Exam Triage Vital Signs ED Triage Vitals [01/16/20 1129]  Enc Vitals Group     BP 134/84     Pulse Rate 90     Resp 18     Temp 98.8 F (37.1 Curtis)     Temp Source Oral     SpO2 100 %     Weight      Height      Head Circumference      Peak Flow      Pain Score 4     Pain Loc      Pain Edu?      Excl. in GC?    No data found.  Updated Vital Signs BP 134/84 (BP Location: Left Arm)   Pulse 90   Temp 98.8 F (37.1 Curtis) (Oral)   Resp 18   SpO2 100%   Visual Acuity Right Eye Distance:   Left Eye Distance:   Bilateral Distance:    Right Eye Near:   Left Eye Near:    Bilateral Near:     Physical Exam Vitals and nursing note reviewed.  Constitutional:      Appearance: He is well-developed.     Comments: Sitting comfortably on exam table  HENT:     Head: Normocephalic and atraumatic.  Eyes:     Conjunctiva/sclera: Conjunctivae normal.  Cardiovascular:     Rate and Rhythm: Normal rate and regular rhythm.     Heart sounds: No murmur.  Pulmonary:     Effort: Pulmonary effort is normal. No respiratory distress.     Breath sounds: Normal breath sounds.     Comments: Soft, nondistended, mild epigastric tenderness, negative rebound, negative Murphy's, negative McBurney's Abdominal:     Palpations: Abdomen is soft.     Tenderness: There is no abdominal tenderness.  Musculoskeletal:     Cervical back: Neck supple.  Skin:    General: Skin is warm and dry.  Neurological:     Mental Status: He is alert.      UC Treatments / Results  Labs (all labs ordered are listed, but only abnormal results are displayed) Labs Reviewed - No data to display  EKG   Radiology No results found.  Procedures Procedures (including critical care time)  Medications Ordered in UC Medications - No data to display  Initial Impression / Assessment and Plan / UC Course  I have reviewed the  triage vital signs and the nursing notes.  Pertinent labs & imaging results that were available during my care of the patient were reviewed by me and considered in my medical decision making (see chart for details).     Given symptoms began after alcohol use, most likely gastritis versus GERD, possible underlying viral gastroenteritis.  Initiating treatment with omeprazole twice daily x2 weeks, Maalox to supplement as needed, Zofran as needed for nausea, avoid alcohol and other trigger foods, provided  materials on foods to avoid.  Do not suspect abdominal emergency at this time for possible gallbladder etiology.  Continue to monitor,Discussed strict return precautions. Patient verbalized understanding and is agreeable with plan.  Final Clinical Impressions(s) / UC Diagnoses   Final diagnoses:  Epigastric pain  Acute gastritis, presence of bleeding unspecified, unspecified gastritis type     Discharge Instructions     Please begin taking omeprazole twice daily before meals for the next 2 weeks Use Zofran as needed for nausea May supplement with Maalox as needed for discomfort as omeprazole begins to work Please read attached and avoid problematic/trigger foods  Please follow-up here or emergency room if developing persistent or worsening pain, vomiting, fevers   ED Prescriptions    Medication Sig Dispense Auth. Provider   omeprazole (PRILOSEC) 20 MG capsule Take 1 capsule (20 mg total) by mouth 2 (two) times daily before a meal for 14 days. 28 capsule [name] [name] C, PA-Curtis   ondansetron (ZOFRAN ODT) 4 MG disintegrating tablet Take 1 tablet (4 mg total) by mouth every 8 (eight) hours as needed for nausea or vomiting. 20 tablet Daegan Arizmendi C, PA-Curtis   alum & mag hydroxide-simeth (MAALOX/MYLANTA) 200-200-20 MG/5ML suspension Take 15 mLs by mouth every 6 (six) hours as needed for indigestion or heartburn. 355 mL Connee Ikner, [employer] C, PA-Curtis     PDMP not reviewed this encounter.    Lew Dawes, PA-Curtis 01/16/20 1149

## 2020-01-20 ENCOUNTER — Encounter: Payer: Self-pay | Admitting: Cardiology

## 2020-01-20 ENCOUNTER — Ambulatory Visit (INDEPENDENT_AMBULATORY_CARE_PROVIDER_SITE_OTHER): Payer: Self-pay | Admitting: Cardiology

## 2020-01-20 ENCOUNTER — Other Ambulatory Visit: Payer: Self-pay

## 2020-01-20 VITALS — BP 134/88 | HR 80 | Temp 97.9°F | Ht 65.0 in | Wt 181.4 lb

## 2020-01-20 DIAGNOSIS — R931 Abnormal findings on diagnostic imaging of heart and coronary circulation: Secondary | ICD-10-CM | POA: Insufficient documentation

## 2020-01-20 DIAGNOSIS — I10 Essential (primary) hypertension: Secondary | ICD-10-CM

## 2020-01-20 DIAGNOSIS — Z8249 Family history of ischemic heart disease and other diseases of the circulatory system: Secondary | ICD-10-CM

## 2020-01-20 NOTE — Progress Notes (Signed)
Patient referred by [name], NP for chest pain  Subjective:   Jeremiah Curtis, male    DOB: 1981-05-23, 39 y.o.   MRN: [id number]   Chief Complaint  Patient presents with  . Chest Pain  . Results    echo  . Follow-up    HPI   39 year old African-American man with hypertension, recent history of COVID in early Dec 2020, chest pain.  Nuclear stress test did not show ischemia/infarction. Echocardiogram showed structurally normal heart, EF 61%.  Calcium score was mildly elevated, 14 in LAD, 8 in circumflex, in the left main coronary artery.  He has not had any recurrence of chest pain.   Initial consultation 12/21/2019: Patient was seen in the emergency department on 11/30/2019 with the symptoms, along with blood pressure of 130 had just tested positive for Covid.  High-sensitivity troponin was mildly elevated at 31.  He is referred for further evaluation by cardiology. Patient has been having chest pains even prior to his COVID infection. Episodes are retrosternal, lasting for a few seconds, unrelated to exertion. He denies any obvious shortness of breath.  Patient has had hypertension for a long time. He is compliant with his antihypertensive medications. He reports feeling stressed and anxious today.  Patient has family h/o CAD, with a cousin of his dying of heart attack at age 29. Patient also lost a brother recently after a cardiac arrest, although this was in the setting of COVID.    Current Outpatient Medications on File Prior to Visit  Medication Sig Dispense Refill  . alum & mag hydroxide-simeth (MAALOX/MYLANTA) 200-200-20 MG/5ML suspension Take 15 mLs by mouth every 6 (six) hours as needed for indigestion or heartburn. 355 mL 0  . amLODipine (NORVASC) 10 MG tablet Take 1 tablet (10 mg total) by mouth daily. 90 tablet 1  . aspirin 81 MG chewable tablet Chew 81 mg by mouth daily.    Marland Kitchen EPINEPHrine 0.3 mg/0.3 mL IJ SOAJ injection Inject 0.3 mLs (0.3 mg total) into  the muscle as needed for anaphylaxis. 1 each 1  . hydrochlorothiazide (HYDRODIURIL) 25 MG tablet Take 1 tablet (25 mg total) by mouth daily. 90 tablet 1  . Multiple Vitamin (MULTIVITAMIN WITH MINERALS) TABS tablet Take 1 tablet by mouth daily.    Marland Kitchen omeprazole (PRILOSEC) 20 MG capsule Take 1 capsule (20 mg total) by mouth 2 (two) times daily before a meal for 14 days. 28 capsule 0  . ondansetron (ZOFRAN ODT) 4 MG disintegrating tablet Take 1 tablet (4 mg total) by mouth every 8 (eight) hours as needed for nausea or vomiting. 20 tablet 0   No current facility-administered medications on file prior to visit.    Cardiovascular and other pertinent studies:  Echocardiogram 01/05/2020: Normal LV systolic function with EF 61%. Moderate concentric hypertrophy of the left ventricle. Left ventricle cavity is normal in size. Normal global wall motion. Normal diastolic filling pattern.  Mild mitral valve thickening. Trace mitral regurgitation. Trace tricuspid regurgitation. Inadequate TR jet to estimate pulmonary artery systolic pressure. Normal right atrial pressure.   Lexiscan Sestamibi stress test 12/28/2019: No previous exam available for comparison. Lexiscan/walking nuclear stress test performed using 1-day protocol. Stress EKG is non-diagnostic, as this is pharmacological stress test. In addition, stress EKG showed sinus tachycardia, inferolateral T wave inversion with no worsening.  Myocardial perfusion imaging is normal. Left ventricular ejection fraction is 58% with normal wall motion.  Low risk study.  12/21/2019: Calcium score: 22. LM: 0. LAD: 14. Cx: 8.  RCA: 0  Heart size is Normal Visible lung fields are: clear Lymph nodes: Not enlarged Abdomen: No visible lesions   EKG 12/02/2019: Sinus rhythm 85 bpm. Borderline for LVH. Inferolateral T wave inversion, possibly due to LVH. Uncahnged compared to previous EKG's.   Recent labs: 11/30/2019: Glucose 107, BUN/Cr 16/0.96. EGFR >60.  Na/K 135/3.5. Rest of the CMP normal H/H 16/48. MCV 89.7. Platelets 84  Results for FERD, HORRIGAN (MRN [id number]) as of 12/21/2019 08:13  Ref. Range 11/30/2019 12:26 11/30/2019 14:53  Troponin I (High Sensitivity) Latest Ref Range: <18 ng/L 31 (H) 32 (H)   08/29/2019: Chol 157, TG 46, HDL 54, LDL 93    Review of Systems  Cardiovascular: Positive for chest pain. Negative for dyspnea on exertion, leg swelling, palpitations and syncope.        Vitals:   01/20/20 1002  BP: 134/88  Pulse: 80  Temp: 97.9 F (36.6 C)  SpO2: 98%     Body mass index is 30.19 kg/m. Filed Weights   01/20/20 1002  Weight: 181 lb 6.4 oz (82.3 kg)     Objective:   Physical Exam  Constitutional: He appears well-developed and well-nourished.  Neck: No JVD present.  Cardiovascular: Normal rate, regular rhythm, normal heart sounds and intact distal pulses.  No murmur heard. Pulmonary/Chest: Effort normal and breath sounds normal. He has no wheezes. He has no rales.  Musculoskeletal:        General: No edema.  Nursing note and vitals reviewed.        Assessment & Recommendations:    39 year old African-American man with hypertension, recent history of COVID in early Dec 2020, chest pain.  Chest pain: Atypical. No ischemia on stress test. Structurally normal heart. Mildly elevated calcium score 22. Recommend aggressive risk factor modification, especially blood pressure control. HDL is 54, should compensate for LDL 93. Discussed heart healthy diet and lifestyle. Repeat lipid panel in 6 months. F/u after that.   Hypertension: Fairly well controlled.  Thank you for referring the patient to [place]. Please feel free to contact with any questions.  Nigel Mormon, MD [facility] Pager: [phone] Office: [phone]

## 2020-02-13 MED FILL — ?AMLODIPINE BESYL 10MG TABL: 10 | 30 days supply | Qty: 30 | Fill #1

## 2020-02-13 MED FILL — ?HYDROCHLOROTHIAZIDE 25MG T: 25 | 30 days supply | Qty: 30 | Fill #1

## 2020-03-13 MED FILL — AMLODIPINE BESYLATE 10 MG T: 10 | 30 days supply | Qty: 30 | Fill #2

## 2020-03-13 MED FILL — ?HYDROCHLOROTHIAZIDE 25MG T: 25 | 30 days supply | Qty: 30 | Fill #2

## 2020-03-15 MED FILL — HYDROCHLOROTHIAZIDE 25 MG T: 25 | 30 days supply | Qty: 30 | Fill #3

## 2020-03-15 MED FILL — AMLODIPINE BESYLATE 10 MG T: 10 | 30 days supply | Qty: 30 | Fill #3

## 2020-03-21 ENCOUNTER — Inpatient Hospital Stay: Payer: Medicaid Other

## 2020-03-21 ENCOUNTER — Inpatient Hospital Stay: Payer: Medicaid Other | Attending: Oncology | Admitting: Oncology

## 2020-03-21 ENCOUNTER — Telehealth: Payer: Self-pay | Admitting: Oncology

## 2020-03-21 DIAGNOSIS — Z79899 Other long term (current) drug therapy: Secondary | ICD-10-CM | POA: Insufficient documentation

## 2020-03-21 DIAGNOSIS — D696 Thrombocytopenia, unspecified: Secondary | ICD-10-CM | POA: Insufficient documentation

## 2020-03-21 NOTE — Telephone Encounter (Signed)
Called pt per 3/24 sch message - no answer and no vmail set up

## 2020-03-22 ENCOUNTER — Other Ambulatory Visit: Payer: Self-pay

## 2020-03-22 ENCOUNTER — Inpatient Hospital Stay: Payer: Medicaid Other

## 2020-03-22 ENCOUNTER — Inpatient Hospital Stay (HOSPITAL_BASED_OUTPATIENT_CLINIC_OR_DEPARTMENT_OTHER): Payer: Medicaid Other | Admitting: Oncology

## 2020-03-22 VITALS — BP 134/90 | HR 89 | Temp 97.8°F | Resp 16 | Ht 65.0 in | Wt 179.6 lb

## 2020-03-22 DIAGNOSIS — D696 Thrombocytopenia, unspecified: Secondary | ICD-10-CM

## 2020-03-22 LAB — CMP (CANCER CENTER ONLY)
ALT: 15 U/L (ref 0–44)
AST: 17 U/L (ref 15–41)
Albumin: 4.3 g/dL (ref 3.5–5.0)
Alkaline Phosphatase: 53 U/L (ref 38–126)
Anion gap: 9 (ref 5–15)
BUN: 20 mg/dL (ref 6–20)
CO2: 29 mmol/L (ref 22–32)
Calcium: 9.8 mg/dL (ref 8.9–10.3)
Chloride: 101 mmol/L (ref 98–111)
Creatinine: 1.13 mg/dL (ref 0.61–1.24)
GFR, Est AFR Am: >60 mL/min (ref >60)
GFR, Estimated: >60 mL/min (ref >60)
Glucose, Bld: 89 mg/dL (ref 70–99)
Potassium: 3.8 mmol/L (ref 3.5–5.1)
Sodium: 139 mmol/L (ref 135–145)
Total Bilirubin: 0.4 mg/dL (ref 0.3–1.2)
Total Protein: 7.9 g/dL (ref 6.5–8.1)

## 2020-03-22 LAB — CBC WITH DIFFERENTIAL (CANCER CENTER ONLY)
Abs Immature Granulocytes: 0.02 10*3/uL (ref 0.00–0.07)
Basophils Absolute: 0 10*3/uL (ref 0.0–0.1)
Basophils Relative: 1 %
Eosinophils Absolute: 0.3 10*3/uL (ref 0.0–0.5)
Eosinophils Relative: 4 %
HCT: 48.1 % (ref 39.0–52.0)
Hemoglobin: 16 g/dL (ref 13.0–17.0)
Immature Granulocytes: 0 %
Lymphocytes Relative: 31 %
Lymphs Abs: 2.8 10*3/uL (ref 0.7–4.0)
MCH: 30 pg (ref 26.0–34.0)
MCHC: 33.3 g/dL (ref 30.0–36.0)
MCV: 90.1 fL (ref 80.0–100.0)
Monocytes Absolute: 0.4 10*3/uL (ref 0.1–1.0)
Monocytes Relative: 5 %
Neutro Abs: 5.2 10*3/uL (ref 1.7–7.7)
Neutrophils Relative %: 59 %
Platelet Count: 102 10*3/uL — ABNORMAL LOW (ref 150–400)
RBC: 5.34 MIL/uL (ref 4.22–5.81)
RDW: 13.3 % (ref 11.5–15.5)
WBC Count: 8.8 10*3/uL (ref 4.0–10.5)
nRBC: 0 % (ref 0.0–0.2)

## 2020-03-22 NOTE — Progress Notes (Signed)
Hematology and Oncology Follow Up Visit  Jeremiah Curtis [id number] March 12, 1981 39 y.o. 03/22/2020 1:19 PM Jeremiah Curtis, NPFleming, Jeremiah Buff, NP   Principle Diagnosis: 39 year old man with thrombocytopenia diagnosed in 2019.  He has fluctuating platelet count as low as 80 currently around 100,000.  Differential diagnosis including ITP versus reactive thrombocytopenia.     Current therapy: Active surveillance.  Interim History: [name] returns today for a follow-up visit.  Since last visit, he reports no major changes in his health.  Continues to feel reasonably well without any recent hospitalizations or illnesses.  He denies any hematochezia, melena or epistaxis.  His performance status and quality of life remained     Medications: I have reviewed the patient's current medications.  Current Outpatient Medications  Medication Sig Dispense Refill  . alum & mag hydroxide-simeth (MAALOX/MYLANTA) 200-200-20 MG/5ML suspension Take 15 mLs by mouth every 6 (six) hours as needed for indigestion or heartburn. 355 mL 0  . amLODipine (NORVASC) 10 MG tablet Take 1 tablet (10 mg total) by mouth daily. 90 tablet 1  . aspirin 81 MG chewable tablet Chew 81 mg by mouth daily.    Marland Kitchen EPINEPHrine 0.3 mg/0.3 mL IJ SOAJ injection Inject 0.3 mLs (0.3 mg total) into the muscle as needed for anaphylaxis. 1 each 1  . hydrochlorothiazide (HYDRODIURIL) 25 MG tablet Take 1 tablet (25 mg total) by mouth daily. 90 tablet 1  . Multiple Vitamin (MULTIVITAMIN WITH MINERALS) TABS tablet Take 1 tablet by mouth daily.    Marland Kitchen omeprazole (PRILOSEC) 20 MG capsule Take 1 capsule (20 mg total) by mouth 2 (two) times daily before a meal for 14 days. 28 capsule 0  . ondansetron (ZOFRAN ODT) 4 MG disintegrating tablet Take 1 tablet (4 mg total) by mouth every 8 (eight) hours as needed for nausea or vomiting. 20 tablet 0   No current facility-administered medications for this visit.     Allergies:  Allergies   Allergen Reactions  . Fish Allergy Anaphylaxis  . Lisinopril     Blurred vision    Past Medical History, Surgical history, Social history, and Family History were reviewed and updated.    Physical Exam: Blood pressure 134/90, pulse 89, temperature 97.8 F (36.6 C), temperature source Temporal, resp. rate 16, height 5\' 5"  (3.664 m), weight 179 lb 9.6 oz (81.5 kg), SpO2 100 %. ECOG:  0     General appearance: Comfortable appearing without any discomfort Head: Normocephalic without any trauma Oropharynx: Mucous membranes are moist and pink without any thrush or ulcers. Eyes: Pupils are equal and round reactive to light. Lymph nodes: No cervical, supraclavicular, inguinal or axillary lymphadenopathy.   Heart:regular rate and rhythm.  S1 and S2 without leg edema. Lung: Clear without any rhonchi or wheezes.  No dullness to percussion. Abdomin: Soft, nontender, nondistended with good bowel sounds.  No hepatosplenomegaly. Musculoskeletal: No joint deformity or effusion.  Full range of motion noted. Neurological: No deficits noted on motor, sensory and deep tendon reflex exam. Skin: No petechial rash or dryness.  Appeared moist.  .     Lab Results: Lab Results  Component Value Date   WBC 8.8 03/22/2020   HGB 16.0 03/22/2020   HCT 48.1 03/22/2020   MCV 90.1 03/22/2020   PLT 102 (L) 03/22/2020     Chemistry      Component Value Date/Time   NA 135 11/30/2019 1225   NA 140 08/29/2019 1017   K 3.5 11/30/2019 1225   CL 102 11/30/2019 1225  CO2 22 11/30/2019 1225   BUN 16 11/30/2019 1225   BUN 19 08/29/2019 1017   CREATININE 0.96 11/30/2019 1225      Component Value Date/Time   CALCIUM 9.4 11/30/2019 1225   ALKPHOS 47 11/30/2019 1225   AST 24 11/30/2019 1225   ALT 20 11/30/2019 1225   BILITOT 0.6 11/30/2019 1225   BILITOT 0.3 08/29/2019 1017         Impression and Plan:   39 year old with:  1.    Mild thrombocytopenia with a counts around 100,000 dating  back to 2019.  The differential diagnosis including ITP versus reactive findings.  Laboratory data for the last 2 years were reviewed and showed platelet count close to 100,000.  Platelet count today is 102 with as low as 84,000 previously.  These findings do not suggest an acute hematological disorder and possibly mild ITP.  At this time I recommended continued active surveillance without any intervention.  A trial of steroids may be required if he develops platelet count of less than 50,000 or any bleeding noted.  Given the stability of his counts over last 2 years, I will not be routinely checking his blood counts in the future.  2.  Follow-up: Happy to see him in the future if he develops worsening thrombocytopenia of less than 50   30  minutes were dedicated to this visit. The time was spent on reviewing laboratory data, discussing treatment options, discussing differential diagnosis and answering questions regarding future plan.      Eli Hose, MD 3/25/20211:19 PM

## 2020-04-19 MED FILL — HYDROCHLOROTHIAZIDE 25 MG T: 25 | 30 days supply | Qty: 30 | Fill #4

## 2020-04-19 MED FILL — AMLODIPINE BESYLATE 10 MG T: 10 | 30 days supply | Qty: 30 | Fill #4

## 2020-05-21 MED FILL — AMLODIPINE BESYLATE 10 MG T: 10 | 30 days supply | Qty: 30 | Fill #4

## 2020-05-22 MED FILL — HYDROCHLOROTHIAZIDE 25 MG T: 25 | 30 days supply | Qty: 30 | Fill #4

## 2020-06-26 MED FILL — AMLODIPINE BESYLATE 10 MG T: 10 | 30 days supply | Qty: 30 | Fill #5

## 2020-06-26 MED FILL — HYDROCHLOROTHIAZIDE 25 MG T: 25 | 30 days supply | Qty: 30 | Fill #5

## 2020-07-19 ENCOUNTER — Ambulatory Visit: Payer: Medicaid Other | Admitting: Cardiology

## 2020-07-24 MED FILL — HYDROCHLOROTHIAZIDE 25 MG T: 25 | 30 days supply | Qty: 30 | Fill #4

## 2020-07-27 MED FILL — ?AMLODIPINE BESYL 10MG TABL: 10 | 30 days supply | Qty: 30 | Fill #1

## 2020-07-30 ENCOUNTER — Ambulatory Visit: Payer: Self-pay | Attending: Nurse Practitioner | Admitting: Nurse Practitioner

## 2020-07-30 ENCOUNTER — Other Ambulatory Visit: Payer: Self-pay | Admitting: Nurse Practitioner

## 2020-07-30 ENCOUNTER — Other Ambulatory Visit: Payer: Self-pay

## 2020-07-30 ENCOUNTER — Encounter: Payer: Self-pay | Admitting: Nurse Practitioner

## 2020-07-30 VITALS — BP 129/85 | HR 89 | Temp 97.7°F | Ht 65.0 in | Wt 182.0 lb

## 2020-07-30 DIAGNOSIS — I10 Essential (primary) hypertension: Secondary | ICD-10-CM

## 2020-07-30 DIAGNOSIS — F419 Anxiety disorder, unspecified: Secondary | ICD-10-CM

## 2020-07-30 DIAGNOSIS — K219 Gastro-esophageal reflux disease without esophagitis: Secondary | ICD-10-CM

## 2020-07-30 MED ORDER — HYDROCHLOROTHIAZIDE 25 MG PO TABS: 25.0000 mg | ORAL_TABLET | Freq: Every day | ORAL | 1 refills | Status: DC

## 2020-07-30 MED ORDER — AMLODIPINE BESYLATE 10 MG PO TABS: 10.0000 mg | ORAL_TABLET | Freq: Every day | ORAL | 1 refills | Status: DC

## 2020-07-30 MED ORDER — OMEPRAZOLE 20 MG PO CPDR: 20.0000 mg | DELAYED_RELEASE_CAPSULE | Freq: Two times a day (BID) | ORAL | 1 refills | Status: DC

## 2020-07-30 MED FILL — ?OMEPRAZOLE 20 MG CPDR: 20 | 30 days supply | Qty: 60 | Fill #0

## 2020-07-30 NOTE — Progress Notes (Signed)
Assessment & Plan:  Diagnoses and all orders for this visit:  Essential hypertension -     hydrochlorothiazide (HYDRODIURIL) 25 MG tablet; Take 1 tablet (25 mg total) by mouth daily. -     amLODipine (NORVASC) 10 MG tablet; Take 1 tablet (10 mg total) by mouth daily. -     Ambulatory referral to Ophthalmology Continue all antihypertensives as prescribed.  Remember to bring in your blood pressure log with you for your follow up appointment.  DASH/Mediterranean Diets are healthier choices for HTN.    Anxiety -     Ambulatory referral to Integrated Behavioral Health  GERD without esophagitis -     omeprazole (PRILOSEC) 20 MG capsule; Take 1 capsule (20 mg total) by mouth 2 (two) times daily before a meal. INSTRUCTIONS: Avoid GERD Triggers: acidic, spicy or fried foods, caffeine, coffee, sodas,  alcohol and chocolate.     Patient has been counseled on age-appropriate routine health concerns for screening and prevention. These are reviewed and up-to-date. Referrals have been placed accordingly. Immunizations are up-to-date or declined.    Subjective:  No chief complaint on file.  HPI Jeremiah Curtis 39 y.o. male presents to office today for follow up. Would like to discuss anxiety.   has a past medical history of Back symptoms, other, Hypertension, and Idiopathic thrombocytopenic purpura (ITP) (HCC).  Anxiety Feels his anxiety is worsening. Associated symptoms: feels heart racing, foggy thoughts, shortness of breath. Relieving factors: deep breathing. He has been experiencing anxiety for 5 years. Symptoms occur several times per week and last a few seconds. Denies chest pain. Would like to speak to a therapist. His mother passed while he was incarcerated and he was unable to see her or attend her funeral. Jeremiah Curtis to grieve this loss.   HTN Blood pressure is well controlled He is taking HCTZ 25 mg and amlodipine 10 mg daily. Denies chest pain, shortness of breath, palpitations,  lightheadedness, dizziness, headaches or BLE edema.  BP Readings from Last 3 Encounters:  07/30/20 129/85  03/22/20 134/90  01/20/20 134/88    Review of Systems  Constitutional: Negative for fever, malaise/fatigue and weight loss.  HENT: Negative.  Negative for nosebleeds.   Eyes: Negative.  Negative for blurred vision, double vision and photophobia.  Respiratory: Negative.  Negative for cough and shortness of breath.   Cardiovascular: Negative.  Negative for chest pain, palpitations and leg swelling.  Gastrointestinal: Positive for heartburn. Negative for nausea and vomiting.  Musculoskeletal: Negative.  Negative for myalgias.  Neurological: Negative.  Negative for dizziness, focal weakness, seizures and headaches.  Psychiatric/Behavioral: Negative for hallucinations and suicidal ideas. The patient is nervous/anxious.     Past Medical History:  Diagnosis Date   Back symptoms, other    Hypertension    Idiopathic thrombocytopenic purpura (ITP) (HCC)     Past Surgical History:  Procedure Laterality Date   BACK SURGERY     LEG SURGERY      Family History  Problem Relation Age of Onset   Hypertension Mother    Hypertension Father    Diabetes Maternal Grandmother    Diabetes Paternal Grandmother     Social History Reviewed with no changes to be made today.   Outpatient Medications Prior to Visit  Medication Sig Dispense Refill   aspirin 81 MG chewable tablet Chew 81 mg by mouth daily.     EPINEPHrine 0.3 mg/0.3 mL IJ SOAJ injection Inject 0.3 mLs (0.3 mg total) into the muscle as needed for anaphylaxis. 1 each 1  Multiple Vitamin (MULTIVITAMIN WITH MINERALS) TABS tablet Take 1 tablet by mouth daily.     ondansetron (ZOFRAN ODT) 4 MG disintegrating tablet Take 1 tablet (4 mg total) by mouth every 8 (eight) hours as needed for nausea or vomiting. 20 tablet 0   amLODipine (NORVASC) 10 MG tablet Take 1 tablet (10 mg total) by mouth daily. 90 tablet 1    hydrochlorothiazide (HYDRODIURIL) 25 MG tablet Take 1 tablet (25 mg total) by mouth daily. 90 tablet 1   alum & mag hydroxide-simeth (MAALOX/MYLANTA) 200-200-20 MG/5ML suspension Take 15 mLs by mouth every 6 (six) hours as needed for indigestion or heartburn. (Patient not taking: Reported on 07/30/2020) 355 mL 0   omeprazole (PRILOSEC) 20 MG capsule Take 1 capsule (20 mg total) by mouth 2 (two) times daily before a meal for 14 days. 28 capsule 0   No facility-administered medications prior to visit.    Allergies  Allergen Reactions   Fish Allergy Anaphylaxis   Lisinopril     Blurred vision       Objective:    BP 129/85 (BP Location: Left Arm, Patient Position: Sitting, Cuff Size: Normal)    Pulse 89    Temp 97.7 F (36.5 C) (Temporal)    Ht 5\' 5"  (1.651 m)    Wt 182 lb (82.6 kg)    SpO2 97%    BMI 30.29 kg/m  Wt Readings from Last 3 Encounters:  07/30/20 182 lb (82.6 kg)  03/22/20 179 lb 9.6 oz (81.5 kg)  01/20/20 181 lb 6.4 oz (82.3 kg)    Physical Exam Vitals and nursing note reviewed.  Constitutional:      Appearance: He is well-developed.  HENT:     Head: Normocephalic and atraumatic.  Cardiovascular:     Rate and Rhythm: Normal rate and regular rhythm.     Heart sounds: Normal heart sounds. No murmur heard.  No friction rub. No gallop.   Pulmonary:     Effort: Pulmonary effort is normal. No tachypnea or respiratory distress.     Breath sounds: Normal breath sounds. No decreased breath sounds, wheezing, rhonchi or rales.  Chest:     Chest wall: No tenderness.  Abdominal:     General: Bowel sounds are normal.     Palpations: Abdomen is soft.  Musculoskeletal:        General: Normal range of motion.     Cervical back: Normal range of motion.  Skin:    General: Skin is warm and dry.  Neurological:     Mental Status: He is alert and oriented to person, place, and time.     Coordination: Coordination normal.  Psychiatric:        Behavior: Behavior normal.  Behavior is cooperative.        Thought Content: Thought content normal.        Judgment: Judgment normal.          Patient has been counseled extensively about nutrition and exercise as well as the importance of adherence with medications and regular follow-up. The patient was given clear instructions to go to ER or return to medical center if symptoms don't improve, worsen or new problems develop. The patient verbalized understanding.   Follow-up: Return in about 3 months (around 10/30/2020).   13/01/2020, FNP-BC [facility] [place], [place]   08/02/2020, 10:20 PM

## 2020-08-02 ENCOUNTER — Encounter: Payer: Self-pay | Admitting: Nurse Practitioner

## 2020-08-08 ENCOUNTER — Other Ambulatory Visit: Payer: Self-pay

## 2020-08-08 ENCOUNTER — Ambulatory Visit: Payer: Self-pay | Attending: Nurse Practitioner

## 2020-08-16 ENCOUNTER — Other Ambulatory Visit: Payer: Self-pay

## 2020-08-16 ENCOUNTER — Ambulatory Visit: Payer: Self-pay | Admitting: Cardiology

## 2020-08-16 ENCOUNTER — Encounter: Payer: Self-pay | Admitting: Cardiology

## 2020-08-16 VITALS — BP 152/95 | HR 89 | Resp 16 | Ht 65.0 in | Wt 178.0 lb

## 2020-08-16 DIAGNOSIS — R931 Abnormal findings on diagnostic imaging of heart and coronary circulation: Secondary | ICD-10-CM

## 2020-08-16 DIAGNOSIS — I10 Essential (primary) hypertension: Secondary | ICD-10-CM

## 2020-08-16 NOTE — Progress Notes (Signed)
Patient referred by [name], NP for chest pain  Subjective:   Jeremiah Curtis, male    DOB: 1981/06/28, 39 y.o.   MRN: [id number]   Chief Complaint  Patient presents with  . Coronary Artery Disease  . Hypertension  . Hyperlipidemia  . Follow-up    6 month  . Results    lab    HPI   39 year old African-American man with hypertension, recent history of COVID in early Dec 2020, chest pain.  Nuclear stress test did not show ischemia/infarction. Echocardiogram showed structurally normal heart, EF 61%.  Calcium score was mildly elevated, 14 in LAD, 8 in circumflex, in the left main coronary artery.  He has not had any recurrence of chest pain. His blood pressure is elevated today, but usually lower than this. He is not on lipid lowering therapy.   He still has chest pain, but not with exertion.   Current Outpatient Medications on File Prior to Visit  Medication Sig Dispense Refill  . amLODipine (NORVASC) 10 MG tablet Take 1 tablet (10 mg total) by mouth daily. 90 tablet 1  . aspirin 81 MG chewable tablet Chew 81 mg by mouth daily.    Marland Kitchen EPINEPHrine 0.3 mg/0.3 mL IJ SOAJ injection Inject 0.3 mLs (0.3 mg total) into the muscle as needed for anaphylaxis. 1 each 1  . hydrochlorothiazide (HYDRODIURIL) 25 MG tablet Take 1 tablet (25 mg total) by mouth daily. 90 tablet 1  . Multiple Vitamin (MULTIVITAMIN WITH MINERALS) TABS tablet Take 1 tablet by mouth daily.    Marland Kitchen omeprazole (PRILOSEC) 20 MG capsule Take 1 capsule (20 mg total) by mouth 2 (two) times daily before a meal. 180 capsule 1   No current facility-administered medications on file prior to visit.    Cardiovascular and other pertinent studies:  EKG 08/16/2020: Sinus rhythm 75 bpm Inferior T wave inversion, unchanged compared to previous EKG  Echocardiogram 01/05/2020: Normal LV systolic function with EF 61%. Moderate concentric hypertrophy of the left ventricle. Left ventricle cavity is normal in size. Normal  global wall motion. Normal diastolic filling pattern.  Mild mitral valve thickening. Trace mitral regurgitation. Trace tricuspid regurgitation. Inadequate TR jet to estimate pulmonary artery systolic pressure. Normal right atrial pressure.   Lexiscan Sestamibi stress test 12/28/2019: No previous exam available for comparison. Lexiscan/walking nuclear stress test performed using 1-day protocol. Stress EKG is non-diagnostic, as this is pharmacological stress test. In addition, stress EKG showed sinus tachycardia, inferolateral T wave inversion with no worsening.  Myocardial perfusion imaging is normal. Left ventricular ejection fraction is 58% with normal wall motion.  Low risk study.  12/21/2019: Calcium score: 22. LM: 0. LAD: 14. Cx: 8. RCA: 0  Heart size is Normal Visible lung fields are: clear Lymph nodes: Not enlarged Abdomen: No visible lesions   EKG 12/02/2019: Sinus rhythm 85 bpm. Borderline for LVH. Inferolateral T wave inversion, possibly due to LVH. Uncahnged compared to previous EKG's.   Recent labs: 11/30/2019: Glucose 107, BUN/Cr 16/0.96. EGFR >60. Na/K 135/3.5. Rest of the CMP normal H/H 16/48. MCV 89.7. Platelets 84  Results for Jeremiah, Curtis (MRN [id number]) as of 12/21/2019 08:13  Ref. Range 11/30/2019 12:26 11/30/2019 14:53  Troponin I (High Sensitivity) Latest Ref Range: <18 ng/L 31 (H) 32 (H)   08/29/2019: Chol 157, TG 46, HDL 54, LDL 93    Review of Systems  Cardiovascular: Positive for chest pain. Negative for dyspnea on exertion, leg swelling, palpitations and syncope.  Vitals:   08/16/20 1521  BP: (!) 152/95  Pulse: 89  Resp: 16  SpO2: 97%     Body mass index is 29.62 kg/m. Filed Weights   08/16/20 1521  Weight: 178 lb (80.7 kg)     Objective:   Physical Exam Vitals and nursing note reviewed.  Constitutional:      Appearance: He is well-developed.  Neck:     Vascular: No JVD.  Cardiovascular:     Rate and Rhythm:  Normal rate and regular rhythm.     Pulses: Intact distal pulses.     Heart sounds: Normal heart sounds. No murmur heard.   Pulmonary:     Effort: Pulmonary effort is normal.     Breath sounds: Normal breath sounds. No wheezing or rales.          Assessment & Recommendations:    39 year old African-American man with hypertension, h/o COVID in 11/2019, chest pain.  Chest pain: Atypical. No ischemia on stress test. Structurally normal heart.  Suspect musculoskeletal etiology.   Elevated calcium score: Mildly elevated calcium score 22. Recommend aggressive risk factor modification, especially blood pressure control. HDL is 54, should compensate for LDL 93. Discussed heart healthy diet and lifestyle. Repeat lipid panel   Hypertension: Elevated BP today. Recommend regular home monitoring of BP. Will calibrate his BP monitor at next visit.   Nigel Mormon, MD [facility] Pager: [phone] Office: [phone]

## 2020-08-18 LAB — LIPID PANEL
Chol/HDL Ratio: 3.7 ratio (ref 0.0–5.0)
Cholesterol, Total: 156 mg/dL (ref 100–199)
HDL: 42 mg/dL (ref >39)
LDL Chol Calc (NIH): 92 mg/dL (ref 0–99)
Triglycerides: 121 mg/dL (ref 0–149)
VLDL Cholesterol Cal: 22 mg/dL (ref 5–40)

## 2020-08-22 ENCOUNTER — Telehealth: Payer: Self-pay | Admitting: Nurse Practitioner

## 2020-08-22 NOTE — Telephone Encounter (Signed)
Pt was sent a letter from financial dept. Inform them, that the application they submitted was incomplete, since they were missing some documentation at the time of the appointment, Pt need to reschedule and resubmit all new papers and application for [employer] and [employer], P.S. old documents has been sent back by mail to the Pt and Pt. need to make a new appt. 

## 2020-08-27 MED FILL — AMLODIPINE BESYLATE 10 MG T: 10 | 30 days supply | Qty: 30 | Fill #2

## 2020-08-27 MED FILL — HYDROCHLOROTHIAZIDE 25 MG T: 25 | 30 days supply | Qty: 30 | Fill #5

## 2020-09-17 ENCOUNTER — Other Ambulatory Visit: Payer: Self-pay

## 2020-09-17 ENCOUNTER — Ambulatory Visit: Payer: Self-pay | Admitting: Cardiology

## 2020-09-17 ENCOUNTER — Encounter: Payer: Self-pay | Admitting: Cardiology

## 2020-09-17 VITALS — BP 140/94 | HR 82 | Resp 16 | Ht 65.0 in | Wt 178.0 lb

## 2020-09-17 DIAGNOSIS — I10 Essential (primary) hypertension: Secondary | ICD-10-CM

## 2020-09-17 DIAGNOSIS — R931 Abnormal findings on diagnostic imaging of heart and coronary circulation: Secondary | ICD-10-CM

## 2020-09-17 MED ORDER — HYDROCHLOROTHIAZIDE 50 MG PO TABS: 25.0000 mg | ORAL_TABLET | Freq: Every day | ORAL | 3 refills | Status: DC

## 2020-09-17 NOTE — Progress Notes (Signed)
Patient referred by [name], NP for chest pain  Subjective:   [name], male    DOB: February 03, 1981, 39 y.o.   MRN: [id number]   Chief Complaint  Patient presents with  . Hypertension  . Follow-up    1 month  . Results    Hypertension Associated symptoms include chest pain. Pertinent negatives include no palpitations.     39 year old African-American man with hypertension, recent history of COVID in early Dec 2020, chest pain.  Nuclear stress test did not show ischemia/infarction. Echocardiogram showed structurally normal heart, EF 61%.  Calcium score was mildly elevated, 14 in LAD, 8 in circumflex, in the left main coronary artery.  He is doing well, denies any chest pain, shortness of breath.  Blood pressure remains elevated.  Current Outpatient Medications on File Prior to Visit  Medication Sig Dispense Refill  . amLODipine (NORVASC) 10 MG tablet Take 1 tablet (10 mg total) by mouth daily. 90 tablet 1  . aspirin 81 MG chewable tablet Chew 81 mg by mouth daily.    Marland Kitchen EPINEPHrine 0.3 mg/0.3 mL IJ SOAJ injection Inject 0.3 mLs (0.3 mg total) into the muscle as needed for anaphylaxis. 1 each 1  . hydrochlorothiazide (HYDRODIURIL) 25 MG tablet Take 1 tablet (25 mg total) by mouth daily. 90 tablet 1  . Multiple Vitamin (MULTIVITAMIN WITH MINERALS) TABS tablet Take 1 tablet by mouth daily.    Marland Kitchen omeprazole (PRILOSEC) 20 MG capsule Take 1 capsule (20 mg total) by mouth 2 (two) times daily before a meal. 180 capsule 1   No current facility-administered medications on file prior to visit.    Cardiovascular and other pertinent studies:  EKG 08/16/2020: Sinus rhythm 75 bpm Inferior T wave inversion, unchanged compared to previous EKG  Echocardiogram 01/05/2020: Normal LV systolic function with EF 61%. Moderate concentric hypertrophy of the left ventricle. Left ventricle cavity is normal in size. Normal global wall motion. Normal diastolic filling pattern.  Mild  mitral valve thickening. Trace mitral regurgitation. Trace tricuspid regurgitation. Inadequate TR jet to estimate pulmonary artery systolic pressure. Normal right atrial pressure.   Lexiscan Sestamibi stress test 12/28/2019: No previous exam available for comparison. Lexiscan/walking nuclear stress test performed using 1-day protocol. Stress EKG is non-diagnostic, as this is pharmacological stress test. In addition, stress EKG showed sinus tachycardia, inferolateral T wave inversion with no worsening.  Myocardial perfusion imaging is normal. Left ventricular ejection fraction is 58% with normal wall motion.  Low risk study.  12/21/2019: Calcium score: 22. LM: 0. LAD: 14. Cx: 8. RCA: 0  Heart size is Normal Visible lung fields are: clear Lymph nodes: Not enlarged Abdomen: No visible lesions   EKG 12/02/2019: Sinus rhythm 85 bpm. Borderline for LVH. Inferolateral T wave inversion, possibly due to LVH. Uncahnged compared to previous EKG's.   Recent labs: 11/30/2019: Glucose 107, BUN/Cr 16/0.96. EGFR >60. Na/K 135/3.5. Rest of the CMP normal H/H 16/48. MCV 89.7. Platelets 84  Results for ARLISS, FRISINA (MRN [id number]) as of 12/21/2019 08:13  Ref. Range 11/30/2019 12:26 11/30/2019 14:53  Troponin I (High Sensitivity) Latest Ref Range: <18 ng/L 31 (H) 32 (H)   08/29/2019: Chol 157, TG 46, HDL 54, LDL 93    Review of Systems  Cardiovascular: Positive for chest pain. Negative for dyspnea on exertion, leg swelling, palpitations and syncope.        Vitals:   09/17/20 1000 09/17/20 1003  BP: (!) 147/91 (!) 140/94  Pulse: 87 82  Resp: 16   SpO2:  100%      Body mass index is 29.62 kg/m. Filed Weights   09/17/20 1000  Weight: 178 lb (80.7 kg)     Objective:   Physical Exam Vitals and nursing note reviewed.  Constitutional:      Appearance: He is well-developed.  Neck:     Vascular: No JVD.  Cardiovascular:     Rate and Rhythm: Normal rate and regular  rhythm.     Pulses: Intact distal pulses.     Heart sounds: Normal heart sounds. No murmur heard.   Pulmonary:     Effort: Pulmonary effort is normal.     Breath sounds: Normal breath sounds. No wheezing or rales.          Assessment & Recommendations:    39 year old African-American man with hypertension, h/o COVID in 11/2019, chest pain.  Chest pain: Atypical. No ischemia on stress test. Structurally normal heart.  Suspect musculoskeletal etiology.  Chest pain now resolved.  Elevated calcium score: Mildly elevated calcium score 22. Recommend aggressive risk factor modification, especially blood pressure control. HDL is 54, should compensate for LDL 93. Discussed heart healthy diet and lifestyle. Repeat lipid panel  Reasonable to omit aspirin given no evidence of obstructive coronary artery disease.  Hypertension: Elevated BP today.  Increase hydrochlorothiazide to 50 mg daily. Check BMP in 2 weeks.  Follow-up in 3 months.  Nigel Mormon, MD [facility] Pager: [phone] Office: [phone]

## 2020-09-29 LAB — BASIC METABOLIC PANEL
BUN/Creatinine Ratio: 15 (ref 9–20)
BUN: 18 mg/dL (ref 6–20)
CO2: 26 mmol/L (ref 20–29)
Calcium: 9.9 mg/dL (ref 8.7–10.2)
Chloride: 99 mmol/L (ref 96–106)
Creatinine, Ser: 1.17 mg/dL (ref 0.76–1.27)
GFR calc Af Amer: 90 mL/min/{1.73_m2} (ref >59)
GFR calc non Af Amer: 78 mL/min/{1.73_m2} (ref >59)
Glucose: 95 mg/dL (ref 65–99)
Potassium: 4.2 mmol/L (ref 3.5–5.2)
Sodium: 139 mmol/L (ref 134–144)

## 2020-10-02 MED FILL — HYDROCHLOROTHIAZIDE 25 MG T: 25 | 30 days supply | Qty: 30 | Fill #0

## 2020-10-02 MED FILL — AMLODIPINE BESYLATE 10 MG T: 10 | 30 days supply | Qty: 30 | Fill #3

## 2020-10-31 ENCOUNTER — Encounter: Payer: Self-pay | Admitting: Nurse Practitioner

## 2020-10-31 ENCOUNTER — Other Ambulatory Visit: Payer: Self-pay

## 2020-10-31 ENCOUNTER — Other Ambulatory Visit: Payer: Self-pay | Admitting: Nurse Practitioner

## 2020-10-31 ENCOUNTER — Ambulatory Visit: Payer: Self-pay | Attending: Nurse Practitioner | Admitting: Nurse Practitioner

## 2020-10-31 VITALS — BP 120/79 | HR 83 | Temp 97.7°F | Ht 65.0 in | Wt 180.0 lb

## 2020-10-31 DIAGNOSIS — I10 Essential (primary) hypertension: Secondary | ICD-10-CM

## 2020-10-31 MED ORDER — HYDROCHLOROTHIAZIDE 50 MG PO TABS: 25.0000 mg | ORAL_TABLET | Freq: Every day | ORAL | 3 refills | Status: DC

## 2020-10-31 MED ORDER — AMLODIPINE BESYLATE 10 MG PO TABS: 10.0000 mg | ORAL_TABLET | Freq: Every day | ORAL | 1 refills | Status: DC

## 2020-10-31 MED FILL — HYDROCHLOROTHIAZIDE 50 MG T: 50 | 30 days supply | Qty: 15 | Fill #0

## 2020-10-31 MED FILL — AMLODIPINE BESYLATE 10 MG T: 10 | 30 days supply | Qty: 30 | Fill #0

## 2020-10-31 NOTE — Progress Notes (Signed)
Assessment & Plan:  Jeremiah Curtis was seen today for follow-up.  Diagnoses and all orders for this visit:  Essential hypertension -     amLODipine (NORVASC) 10 MG tablet; Take 1 tablet (10 mg total) by mouth daily. -     hydrochlorothiazide (HYDRODIURIL) 50 MG tablet; Take 0.5 tablets (25 mg total) by mouth daily.    Patient has been counseled on age-appropriate routine health concerns for screening and prevention. These are reviewed and up-to-date. Referrals have been placed accordingly. Immunizations are up-to-date or declined.    Subjective:   Chief Complaint  Patient presents with  . Follow-up    Pt. is here for 3 months F.U. on hypertension.    HPI Jeremiah Curtis 39 y.o. male presents to office today for follow up to HTN. Seeing Cardiology for atypical chest pain and mildly elevated calcium score.    Essential Hypertension Blood pressure is well controlled. Denies chest pain, shortness of breath, palpitations, lightheadedness, dizziness, headaches, erectile dysfunction or BLE edema.  Taking amlodipine 10 mg daily and hydrochlorothiazide 25 mg daily. He is eating healthier and has stopped adding salt to his food. Goal is 10lb weight loss at next visit. Although HCTZ was increased to 50 mg he has continued to take 25 mg. He was not aware of the change.  BP Readings from Last 3 Encounters:  10/31/20 120/79  09/17/20 (!) 140/94  08/16/20 (!) 152/95    Review of Systems  Constitutional: Negative for fever, malaise/fatigue and weight loss.  HENT: Negative.  Negative for nosebleeds.   Eyes: Negative.  Negative for blurred vision, double vision and photophobia.  Respiratory: Negative.  Negative for cough and shortness of breath.   Cardiovascular: Negative.  Negative for chest pain, palpitations and leg swelling.  Gastrointestinal: Negative.  Negative for heartburn, nausea and vomiting.  Musculoskeletal: Negative.  Negative for myalgias.  Neurological: Negative.  Negative for  dizziness, focal weakness, seizures and headaches.  Psychiatric/Behavioral: Negative.  Negative for suicidal ideas.    Past Medical History:  Diagnosis Date  . Back symptoms, other   . Hypertension   . Idiopathic thrombocytopenic purpura (ITP) (HCC)     Past Surgical History:  Procedure Laterality Date  . BACK SURGERY    . LEG SURGERY      Family History  Problem Relation Age of Onset  . Hypertension Mother   . Hypertension Father   . Diabetes Maternal Grandmother   . Diabetes Paternal Grandmother     Social History Reviewed with no changes to be made today.   Outpatient Medications Prior to Visit  Medication Sig Dispense Refill  . EPINEPHrine 0.3 mg/0.3 mL IJ SOAJ injection Inject 0.3 mLs (0.3 mg total) into the muscle as needed for anaphylaxis. 1 each 1  . Multiple Vitamin (MULTIVITAMIN WITH MINERALS) TABS tablet Take 1 tablet by mouth daily.    Marland Kitchen amLODipine (NORVASC) 10 MG tablet Take 1 tablet (10 mg total) by mouth daily. 90 tablet 1  . hydrochlorothiazide (HYDRODIURIL) 50 MG tablet Take 0.5 tablets (25 mg total) by mouth daily. 90 tablet 3  . omeprazole (PRILOSEC) 20 MG capsule Take 1 capsule (20 mg total) by mouth 2 (two) times daily before a meal. 180 capsule 1   No facility-administered medications prior to visit.    Allergies  Allergen Reactions  . Fish Allergy Anaphylaxis  . Lisinopril     Blurred vision       Objective:    BP 120/79 (BP Location: Right Arm, Patient Position: Sitting, Cuff  Size: Large)   Pulse 83   Temp 97.7 F (36.5 C) (Temporal)   Ht 5\' 5"  (1.651 m)   Wt 180 lb (81.6 kg)   SpO2 99%   BMI 29.95 kg/m  Wt Readings from Last 3 Encounters:  10/31/20 180 lb (81.6 kg)  09/17/20 178 lb (80.7 kg)  08/16/20 178 lb (80.7 kg)    Physical Exam Vitals and nursing note reviewed.  Constitutional:      Appearance: He is well-developed.  HENT:     Head: Normocephalic and atraumatic.  Cardiovascular:     Rate and Rhythm: Normal rate and  regular rhythm.     Heart sounds: Normal heart sounds. No murmur heard.  No friction rub. No gallop.   Pulmonary:     Effort: Pulmonary effort is normal. No tachypnea or respiratory distress.     Breath sounds: Normal breath sounds. No decreased breath sounds, wheezing, rhonchi or rales.  Chest:     Chest wall: No tenderness.  Abdominal:     General: Bowel sounds are normal.     Palpations: Abdomen is soft.  Musculoskeletal:        General: Normal range of motion.     Cervical back: Normal range of motion.  Skin:    General: Skin is warm and dry.  Neurological:     Mental Status: He is alert and oriented to person, place, and time.     Coordination: Coordination normal.  Psychiatric:        Behavior: Behavior normal. Behavior is cooperative.        Thought Content: Thought content normal.        Judgment: Judgment normal.          Patient has been counseled extensively about nutrition and exercise as well as the importance of adherence with medications and regular follow-up. The patient was given clear instructions to go to ER or return to medical center if symptoms don't improve, worsen or new problems develop. The patient verbalized understanding.   Follow-up: Return in about 3 months (around 01/31/2021) for HTN.   03/31/2021, FNP-BC [facility] [place], [place]   10/31/2020, 10:53 AM

## 2020-11-04 ENCOUNTER — Emergency Department (HOSPITAL_COMMUNITY)
Admission: EM | Admit: 2020-11-04 | Discharge: 2020-11-04 | Disposition: A | Discharge status: Home / Self Care | Payer: Medicaid Other | Attending: Emergency Medicine | Admitting: Emergency Medicine

## 2020-11-04 ENCOUNTER — Encounter (HOSPITAL_COMMUNITY): Payer: Self-pay

## 2020-11-04 DIAGNOSIS — M5442 Lumbago with sciatica, left side: Secondary | ICD-10-CM | POA: Insufficient documentation

## 2020-11-04 DIAGNOSIS — Z79899 Other long term (current) drug therapy: Secondary | ICD-10-CM | POA: Insufficient documentation

## 2020-11-04 DIAGNOSIS — G8929 Other chronic pain: Secondary | ICD-10-CM | POA: Insufficient documentation

## 2020-11-04 DIAGNOSIS — I10 Essential (primary) hypertension: Secondary | ICD-10-CM | POA: Insufficient documentation

## 2020-11-04 DIAGNOSIS — Z87891 Personal history of nicotine dependence: Secondary | ICD-10-CM | POA: Insufficient documentation

## 2020-11-04 DIAGNOSIS — M545 Low back pain, unspecified: Secondary | ICD-10-CM

## 2020-11-04 MED ORDER — HYDROCODONE-ACETAMINOPHEN 5-325 MG PO TABS
1.0000 | ORAL_TABLET | Freq: Once | ORAL | Status: AC
  Administered 2020-11-04: 1 via ORAL
  Filled 2020-11-04: qty 1

## 2020-11-04 MED ORDER — CYCLOBENZAPRINE HCL 10 MG PO TABS: 10.0000 mg | ORAL_TABLET | Freq: Two times a day (BID) | ORAL | 0 refills | Status: DC | PRN

## 2020-11-04 MED ORDER — DEXAMETHASONE SODIUM PHOSPHATE 10 MG/ML IJ SOLN
8.0000 mg | Freq: Once | INTRAMUSCULAR | Status: AC
  Administered 2020-11-04: 8 mg via INTRAMUSCULAR
  Filled 2020-11-04: qty 1

## 2020-11-04 NOTE — ED Triage Notes (Signed)
Pt reports that he has back pain that shoots down his L leg, reports having back surgery several years ago and since has always had back pain and it is worse with cold weather.

## 2020-11-04 NOTE — Discharge Instructions (Addendum)
Please read instructions below.  You can take 650mg  tylenol every 6 hours as needed for pain.   Apply ice to your back for 20 minutes at a time.  You can also apply heat if this provides more relief.   You can take Flexeril/cyclobenzaprine every 12 hours as needed for muscle spasm.  Be aware this medication can make you drowsy; do not take while driving or drinking alcohol.   Follow-up with your primary care provider symptoms persist.   Return to ER if new numbness or tingling in your arms or legs, inability to urinate, inability to hold your bowels, or weakness in your extremities.

## 2020-11-04 NOTE — ED Provider Notes (Signed)
[facility] EMERGENCY DEPARTMENT Provider Note   CSN: [id number] Arrival date & time: 11/04/20  1943     History Chief Complaint  Patient presents with  . Sciatica    Jeremiah Curtis is a 39 y.o. male past medical history of chronic low back pain with history of back surgery, hypertension, presenting to the emergency department with 4 days of worsening low back pain.  Pain is to his left back, worse with movement.  He has radiating pain down his left posterior leg.  No new numbness or weakness, bowel or bladder incontinence, saddle paresthesias.  No abdominal pain, urinary symptoms, fevers, history of IVDU or cancer.  Has been treating his symptoms with Tylenol with minimal relief.  Muscle relaxers and Norco have provided him with relief in the past.  The history is provided by the patient.       Past Medical History:  Diagnosis Date  . Back symptoms, other   . Hypertension   . Idiopathic thrombocytopenic purpura (ITP) [facility])     Patient Active Problem List   Diagnosis Date Noted  . Elevated coronary artery calcium score 01/20/2020  . Atypical chest pain 12/21/2019  . Family history of early CAD 12/21/2019  . Essential hypertension 12/21/2019    Past Surgical History:  Procedure Laterality Date  . BACK SURGERY    . LEG SURGERY         Family History  Problem Relation Age of Onset  . Hypertension Mother   . Hypertension Father   . Diabetes Maternal Grandmother   . Diabetes Paternal Grandmother     Social History   Tobacco Use  . Smoking status: Former Smoker    Packs/day: 0.50    Years: 16.00    Pack years: 8.00    Types: Cigarettes    Quit date: 2021    Years since quitting: 0.8  . Smokeless tobacco: Never Used  Vaping Use  . Vaping Use: Never used  Substance Use Topics  . Alcohol use: Yes    Comment: occasionally  . Drug use: No    Home Medications Prior to Admission medications   Medication Sig Start Date End Date Taking?  Authorizing Provider  amLODipine (NORVASC) 10 MG tablet Take 1 tablet (10 mg total) by mouth daily. 10/31/20   Claiborne Rigg, NP  cyclobenzaprine (FLEXERIL) 10 MG tablet Take 1 tablet (10 mg total) by mouth 2 (two) times daily as needed for muscle spasms. 11/04/20   Assunta Pupo, [place] N, PA-C  EPINEPHrine 0.3 mg/0.3 mL IJ SOAJ injection Inject 0.3 mLs (0.3 mg total) into the muscle as needed for anaphylaxis. 01/10/20   Claiborne Rigg, NP  hydrochlorothiazide (HYDRODIURIL) 50 MG tablet Take 0.5 tablets (25 mg total) by mouth daily. 10/31/20   Claiborne Rigg, NP  Multiple Vitamin (MULTIVITAMIN WITH MINERALS) TABS tablet Take 1 tablet by mouth daily.    [provider]  omeprazole (PRILOSEC) 20 MG capsule Take 1 capsule (20 mg total) by mouth 2 (two) times daily before a meal. 07/30/20 10/28/20  Claiborne Rigg, NP    Allergies    Fish allergy and Lisinopril  Review of Systems   Review of Systems  Musculoskeletal: Positive for back pain.  Neurological: Negative for weakness and numbness.    Physical Exam Updated Vital Signs BP (!) 146/98 (BP Location: Right Arm)   Pulse 67   Temp 98.3 F (36.8 C) (Oral)   Resp 16   SpO2 99%   Physical Exam Vitals  and nursing note reviewed.  Constitutional:      General: He is not in acute distress.    Appearance: He is well-developed.  HENT:     Head: Normocephalic and atraumatic.  Eyes:     Conjunctiva/sclera: Conjunctivae normal.  Cardiovascular:     Rate and Rhythm: Normal rate and regular rhythm.  Pulmonary:     Effort: Pulmonary effort is normal. No respiratory distress.     Breath sounds: Normal breath sounds.  Abdominal:     General: Bowel sounds are normal.     Palpations: Abdomen is soft.     Tenderness: There is no abdominal tenderness.  Musculoskeletal:     Comments: TTP to left lower lumbar musculature extending to the left gluteal region.  No midline spinal tenderness.  Neurological:     Mental Status: He is alert.      Comments: Normal tone.  5/5 strength RLE, 4/5 strength LLE (baseline per patient)  with dorsiflexion/plantar flexion Sensory: light touch normal in BLE extremities.   Gait: normal gait and balance CV: distal pulses palpable throughout    Psychiatric:        Mood and Affect: Mood normal.        Behavior: Behavior normal.     ED Results / Procedures / Treatments   Labs (all labs ordered are listed, but only abnormal results are displayed) Labs Reviewed - No data to display  EKG None  Radiology No results found.  Procedures Procedures (including critical care time)  Medications Ordered in ED Medications  HYDROcodone-acetaminophen (NORCO/VICODIN) 5-325 MG per tablet 1 tablet (1 tablet Oral Given 11/04/20 2223)  dexamethasone (DECADRON) injection 8 mg (8 mg Intramuscular Given 11/04/20 2223)    ED Course  I have reviewed the triage vital signs and the nursing notes.  Pertinent labs & imaging results that were available during my care of the patient were reviewed by me and considered in my medical decision making (see chart for details).    MDM Rules/Calculators/A&P                          Patient with acute on chronic low back pain with left-sided radiculopathy.  Patient reports baseline weakness in left lower extremity which is unchanged today.  Patient can walk but states is painful.  No loss of bowel or bladder control.  No concern for cauda equina.  No fever, night sweats, weight loss, h/o cancer, IVDU.  RICE protocol and pain medicine indicated and discussed with patient.  Outpatient follow-up recommended.  Final Clinical Impression(s) / ED Diagnoses Final diagnoses:  Acute exacerbation of chronic low back pain    Rx / DC Orders ED Discharge Orders         Ordered    cyclobenzaprine (FLEXERIL) 10 MG tablet  2 times daily PRN        11/04/20 2205           Caylen Kuwahara, [place] N, PA-C 11/04/20 2241    Benjiman Core, MD 11/04/20 [phone]

## 2020-11-20 ENCOUNTER — Other Ambulatory Visit: Payer: Self-pay

## 2020-11-20 ENCOUNTER — Ambulatory Visit: Payer: Self-pay | Attending: Nurse Practitioner

## 2020-11-20 ENCOUNTER — Other Ambulatory Visit: Payer: Self-pay | Admitting: Nurse Practitioner

## 2020-11-20 DIAGNOSIS — I10 Essential (primary) hypertension: Secondary | ICD-10-CM

## 2020-11-20 MED ORDER — HYDROCHLOROTHIAZIDE 25 MG PO TABS: 25.0000 mg | ORAL_TABLET | Freq: Every day | ORAL | 3 refills | Status: DC

## 2020-11-20 MED FILL — HYDROCHLOROTHIAZIDE 25 MG T: 25 | 30 days supply | Qty: 30 | Fill #0

## 2020-11-20 NOTE — Progress Notes (Signed)
He has been taking 50 mg of HCTZ instead of 25 mg. Needs appt for BP check with nurse or LUKE and BMP

## 2020-11-21 ENCOUNTER — Ambulatory Visit: Payer: Self-pay

## 2020-11-21 LAB — BASIC METABOLIC PANEL
BUN/Creatinine Ratio: 19 (ref 9–20)
BUN: 19 mg/dL (ref 6–20)
CO2: 27 mmol/L (ref 20–29)
Calcium: 9.6 mg/dL (ref 8.7–10.2)
Chloride: 99 mmol/L (ref 96–106)
Creatinine, Ser: 0.98 mg/dL (ref 0.76–1.27)
GFR calc Af Amer: 112 mL/min/{1.73_m2} (ref >59)
GFR calc non Af Amer: 97 mL/min/{1.73_m2} (ref >59)
Glucose: 79 mg/dL (ref 65–99)
Potassium: 4.3 mmol/L (ref 3.5–5.2)
Sodium: 139 mmol/L (ref 134–144)

## 2020-12-06 ENCOUNTER — Ambulatory Visit (HOSPITAL_COMMUNITY)
Admission: EM | Admit: 2020-12-06 | Discharge: 2020-12-06 | Disposition: A | Discharge status: Home / Self Care | Payer: Self-pay | Attending: Emergency Medicine | Admitting: Emergency Medicine

## 2020-12-06 ENCOUNTER — Other Ambulatory Visit: Payer: Self-pay

## 2020-12-06 ENCOUNTER — Encounter (HOSPITAL_COMMUNITY): Payer: Self-pay | Admitting: Emergency Medicine

## 2020-12-06 DIAGNOSIS — M545 Low back pain, unspecified: Secondary | ICD-10-CM

## 2020-12-06 DIAGNOSIS — G8929 Other chronic pain: Secondary | ICD-10-CM

## 2020-12-06 MED ORDER — CYCLOBENZAPRINE HCL 10 MG PO TABS: 10.0000 mg | ORAL_TABLET | Freq: Two times a day (BID) | ORAL | 0 refills | Status: DC | PRN

## 2020-12-06 NOTE — ED Triage Notes (Addendum)
Pt presents mid to lower back pain. States was injured on the job and had back surgery and states back has never been the same ever since.

## 2020-12-06 NOTE — ED Provider Notes (Signed)
[facility]    CSN: [id number] Arrival date & time: 12/06/20  1819      History   Chief Complaint Chief Complaint  Patient presents with  . Back Pain    HPI Jeremiah Curtis is a 39 y.o. male.   Patient presents with 1 week history of worsening back pain.  He has chronic low back pain with a history of back surgery.  His pain is worse on the left side and radiates down his left posterior leg.  He denies new numbness, weakness, bowel/bladder incontinence, saddle anesthesia, abdominal pain, dysuria, fever, or other symptoms.  Treatment attempted at home with Tylenol and Flexeril.  His medical history also includes hypertension and ITP.  The history is provided by the patient and medical records.    Past Medical History:  Diagnosis Date  . Back symptoms, other   . Hypertension   . Idiopathic thrombocytopenic purpura (ITP) [facility])     Patient Active Problem List   Diagnosis Date Noted  . Elevated coronary artery calcium score 01/20/2020  . Atypical chest pain 12/21/2019  . Family history of early CAD 12/21/2019  . Essential hypertension 12/21/2019    Past Surgical History:  Procedure Laterality Date  . BACK SURGERY    . LEG SURGERY         Home Medications    Prior to Admission medications   Medication Sig Start Date End Date Taking? Authorizing Provider  amLODipine (NORVASC) 10 MG tablet Take 1 tablet (10 mg total) by mouth daily. 10/31/20   Claiborne Rigg, NP  cyclobenzaprine (FLEXERIL) 10 MG tablet Take 1 tablet (10 mg total) by mouth 2 (two) times daily as needed for muscle spasms. 12/06/20   Mickie Bail, NP  EPINEPHrine 0.3 mg/0.3 mL IJ SOAJ injection Inject 0.3 mLs (0.3 mg total) into the muscle as needed for anaphylaxis. 01/10/20   Claiborne Rigg, NP  hydrochlorothiazide (HYDRODIURIL) 25 MG tablet Take 1 tablet (25 mg total) by mouth daily. 11/20/20   Claiborne Rigg, NP  Multiple Vitamin (MULTIVITAMIN WITH MINERALS) TABS tablet Take 1 tablet by  mouth daily.    [provider]  omeprazole (PRILOSEC) 20 MG capsule Take 1 capsule (20 mg total) by mouth 2 (two) times daily before a meal. 07/30/20 10/28/20  Claiborne Rigg, NP    Family History Family History  Problem Relation Age of Onset  . Hypertension Mother   . Hypertension Father   . Diabetes Maternal Grandmother   . Diabetes Paternal Grandmother     Social History Social History   Tobacco Use  . Smoking status: Former Smoker    Packs/day: 0.50    Years: 16.00    Pack years: 8.00    Types: Cigarettes    Quit date: 2021    Years since quitting: 0.9  . Smokeless tobacco: Never Used  Vaping Use  . Vaping Use: Never used  Substance Use Topics  . Alcohol use: Yes    Comment: occasionally  . Drug use: No     Allergies   Fish allergy and Lisinopril   Review of Systems Review of Systems  Constitutional: Negative for chills and fever.  HENT: Negative for ear pain and sore throat.   Eyes: Negative for pain and visual disturbance.  Respiratory: Negative for cough and shortness of breath.   Cardiovascular: Negative for chest pain and palpitations.  Gastrointestinal: Negative for abdominal pain and vomiting.  Genitourinary: Negative for dysuria and hematuria.  Musculoskeletal: Positive for back  pain. Negative for arthralgias.  Skin: Negative for color change and rash.  Neurological: Negative for seizures, syncope, weakness and numbness.  All other systems reviewed and are negative.    Physical Exam Triage Vital Signs ED Triage Vitals  Enc Vitals Group     BP 12/06/20 1922 (!) 147/89     Pulse Rate 12/06/20 1922 89     Resp 12/06/20 1922 16     Temp 12/06/20 1922 98.4 F (36.9 C)     Temp Source 12/06/20 1922 Oral     SpO2 12/06/20 1922 100 %     Weight --      Height --      Head Circumference --      Peak Flow --      Pain Score 12/06/20 1921 10     Pain Loc --      Pain Edu? --      Excl. in GC? --    No data found.  Updated Vital  Signs BP (!) 147/89 (BP Location: Right Arm)   Pulse 89   Temp 98.4 F (36.9 C) (Oral)   Resp 16   SpO2 100%   Visual Acuity Right Eye Distance:   Left Eye Distance:   Bilateral Distance:    Right Eye Near:   Left Eye Near:    Bilateral Near:     Physical Exam Vitals and nursing note reviewed.  Constitutional:      General: He is not in acute distress.    Appearance: He is well-developed and well-nourished.  HENT:     Head: Normocephalic and atraumatic.     Mouth/Throat:     Mouth: Mucous membranes are moist.  Eyes:     Conjunctiva/sclera: Conjunctivae normal.  Cardiovascular:     Rate and Rhythm: Normal rate and regular rhythm.     Heart sounds: Normal heart sounds.  Pulmonary:     Effort: Pulmonary effort is normal. No respiratory distress.     Breath sounds: Normal breath sounds.  Abdominal:     Palpations: Abdomen is soft.     Tenderness: There is no abdominal tenderness. There is no guarding or rebound.  Musculoskeletal:        General: Tenderness present. No swelling, deformity or edema. Normal range of motion.     Cervical back: Neck supple.     Comments: Generalized muscular tenderness of mid to lower back.    Skin:    General: Skin is warm and dry.     Capillary Refill: Capillary refill takes less than 2 seconds.     Findings: No bruising, erythema, lesion or rash.  Neurological:     General: No focal deficit present.     Mental Status: He is alert and oriented to person, place, and time.     Sensory: No sensory deficit.     Motor: No weakness.     Gait: Gait normal.  Psychiatric:        Mood and Affect: Mood and affect and mood normal.        Behavior: Behavior normal.      UC Treatments / Results  Labs (all labs ordered are listed, but only abnormal results are displayed) Labs Reviewed - No data to display  EKG   Radiology No results found.  Procedures Procedures (including critical care time)  Medications Ordered in UC Medications -  No data to display  Initial Impression / Assessment and Plan / UC Course  I have reviewed the triage vital  signs and the nursing notes.  Pertinent labs & imaging results that were available during my care of the patient were reviewed by me and considered in my medical decision making (see chart for details).   Acute exacerbation of chronic low back pain.  Refill of Flexeril provided today.  Instructed patient to continue taking Tylenol as needed for discomfort.  Instructed him to schedule an appointment with orthopedics as soon as possible to discuss his ongoing back pain.  Patient agrees to plan of care.   Final Clinical Impressions(s) / UC Diagnoses   Final diagnoses:  Acute exacerbation of chronic low back pain     Discharge Instructions     Schedule an appointment with an orthopedist such as the one listed below to discuss your ongoing back pain.    Continue taking the Tylenol and Flexeril as needed for discomfort and muscle spasm.  A refill of the Flexeril was prescribed today.  Do not drive, operate machinery, or drink alcohol with this medication as it will cause drowsiness.        ED Prescriptions    Medication Sig Dispense Auth. Provider   cyclobenzaprine (FLEXERIL) 10 MG tablet Take 1 tablet (10 mg total) by mouth 2 (two) times daily as needed for muscle spasms. 20 tablet Mickie Bail, NP     I have reviewed the PDMP during this encounter.   Mickie Bail, NP 12/06/20 1950

## 2020-12-06 NOTE — Discharge Instructions (Addendum)
Schedule an appointment with an orthopedist such as the one listed below to discuss your ongoing back pain.    Continue taking the Tylenol and Flexeril as needed for discomfort and muscle spasm.  A refill of the Flexeril was prescribed today.  Do not drive, operate machinery, or drink alcohol with this medication as it will cause drowsiness.

## 2020-12-10 MED FILL — AMLODIPINE BESYLATE 10 MG T: 10 | 30 days supply | Qty: 30 | Fill #1

## 2020-12-11 ENCOUNTER — Telehealth: Payer: Self-pay | Admitting: Orthopaedic Surgery

## 2020-12-11 NOTE — Telephone Encounter (Signed)
Forwarding medical request to [facility] today

## 2020-12-12 ENCOUNTER — Other Ambulatory Visit: Payer: Self-pay

## 2020-12-12 ENCOUNTER — Ambulatory Visit (HOSPITAL_COMMUNITY)
Admission: RE | Admit: 2020-12-12 | Discharge: 2020-12-12 | Disposition: A | Discharge status: Home / Self Care | Payer: Self-pay | Source: Ambulatory Visit | Attending: Physician Assistant | Admitting: Physician Assistant

## 2020-12-12 ENCOUNTER — Encounter: Payer: Self-pay | Admitting: Nurse Practitioner

## 2020-12-12 ENCOUNTER — Ambulatory Visit: Payer: Self-pay | Admitting: Physician Assistant

## 2020-12-12 VITALS — BP 138/89 | HR 77 | Resp 18 | Ht 65.0 in | Wt 179.0 lb

## 2020-12-12 DIAGNOSIS — G8929 Other chronic pain: Secondary | ICD-10-CM

## 2020-12-12 DIAGNOSIS — M5442 Lumbago with sciatica, left side: Secondary | ICD-10-CM | POA: Insufficient documentation

## 2020-12-12 MED ORDER — METHYLPREDNISOLONE ACETATE 80 MG/ML IJ SUSP
80.0000 mg | Freq: Once | INTRAMUSCULAR | Status: AC
  Administered 2020-12-12: 80 mg via INTRAMUSCULAR

## 2020-12-12 NOTE — Patient Instructions (Signed)
I have ordered an x-ray of your back to be completed.  We will call you with the results.  I strongly encourage you to reapply for [place] financial assistance.  Please let us know if there is anything else we can do for you  Jeremiah Jaffe, PA-C Physician Assistant [facility] [email or web address]    What You Need to Know About Chronic Back Pain Long-term (chronic) back pain is back pain that lasts for 12 weeks or longer. It often affects the lower back and can range from mild to severe. Many people have back pain at some point in their lives. It can feel different to each person. It may feel like a muscle ache or a sharp, stabbing pain. The pain often gets worse over time. It can be difficult to find the cause of chronic back pain. Treating chronic back pain often starts with rest and pain relief, followed by exercises (physical therapy) to strengthen the muscles that support your back. You may have to try different things to see what works best for you. If other treatments do not help, or if your pain is caused by a condition or an injury, you may need surgery. How can back pain affect me? Chronic back pain is uncomfortable and can make it hard to do your usual daily activities. Chronic back pain can:  Cause numbness and tingling.  Come and go.  Get worse when you are sitting, standing, walking, bending, or lifting.  Affect you while you are active, at rest, or both.  Eventually make it hard to move around.  Occur with fever, weight loss, or difficulty urinating. What are the benefits of treating back pain? Treating chronic back pain may:  Relieve pain.  Keep your pain from getting worse.  Make it easier for you to do your usual activities. What are some steps I can take to decrease my back pain?   Take over-the-counter or prescription medicines only as told by your health care provider.  If directed, apply heat  to the affected area. Use the heat source that your health care provider recommends, such as a moist heat pack or a heating pad. ? Place a towel between your skin and the heat source. ? Leave the heat on for 20-30 minutes. ? Remove the heat if your skin turns bright red. This is especially important if you are unable to feel pain, heat, or cold. You may have a greater risk of getting burned.  If directed, put ice on the affected area: ? Put ice in a plastic bag. ? Place a towel between your skin and the bag. ? Leave the ice on for 20 minutes, 2-3 times a day.  Get regular exercise as told by your health care provider to improve flexibility and strength.  Do not smoke.  Maintain a healthy weight.  When lifting objects: ? Keep your feet as far apart as your shoulders (shoulder-width apart) or farther apart. ? Tighten the muscles in your abdomen. ? Bend your knees and hips and keep your spine neutral. It is important to lift using the strength of your legs, not your back. Do not lock your knees straight out. ? Always ask for help to lift heavy or awkward objects. What can happen if my back pain goes untreated? Untreated back pain can:  Get worse over time.  Start to occur more often or at different times, such as when you are resting.  Cause posture problems.  Make it hard to move  around (limit mobility). Where can I get support? Chronic back pain can be a frustrating condition to manage. It may help to talk with other people who are having a similar experience. Consider joining a support group for people dealing with chronic back pain. Ask your health care provider about support groups in your area. You can also find online and in-person support groups through:  The American Chronic Pain Association: [employer]  The U.S. Pain Foundation: uspainfoundation.org/support-groups Contact a health care provider if:  Your symptoms do not get better or they get  worse.  You have severe back pain.  You have chronic back pain and a fever.  You lose weight without trying.  You have difficulty urinating.  You experience numbness or tingling.  You develop new pain after an injury. Summary  Chronic back pain is often treated with rest, pain relief, and physical therapy.  Get regular exercise to improve your strength and flexibility.  Put heat and ice on the affected areas as directed by your health care provider.  Chronic back pain can be challenging to live with. Joining a support group may help you manage your condition. This information is not intended to replace advice given to you by your health care provider. Make sure you discuss any questions you have with your health care provider. Document Revised: 11/27/2017 Document Reviewed: 08/23/2016 Elsevier Patient Education  2020 [employer].

## 2020-12-12 NOTE — Progress Notes (Signed)
Patient presents with a walker complaining of back pain scaled at an 8 currently. Patient has taken medication today and patient has not eaten today.

## 2020-12-12 NOTE — Progress Notes (Signed)
Established Patient Office Visit  Subjective:  Patient ID: Jeremiah Curtis, male    DOB: 1981-07-27  Age: 39 y.o. MRN: [id number]  CC:  Chief Complaint  Patient presents with   Back Pain    HPI Jeremiah Curtis reports that he continues to have low left-sided back pain, states that it radiates down his left leg.  Reports that he has chronic back pain, states it has been worse in the past 2 weeks.  Reports that he was seen in emergency room on December 06, 2020 for the same complaint, states that he was encouraged to continue Tylenol and Flexeril.  Reports he has been taking the Tylenol and Flexeril without relief.  Denies numbness or tingling, saddle anesthesia.  Reports history of back surgery, states that he is unable to use NSAIDs, states cortisone injections have not offered relief in the past  Past Medical History:  Diagnosis Date   Back symptoms, other    Hypertension    Idiopathic thrombocytopenic purpura (ITP) (HCC)     Past Surgical History:  Procedure Laterality Date   BACK SURGERY     LEG SURGERY      Family History  Problem Relation Age of Onset   Hypertension Mother    Hypertension Father    Diabetes Maternal Grandmother    Diabetes Paternal Grandmother     Social History   Socioeconomic History   Marital status: Single    Spouse name: Not on file   Number of children: 3   Years of education: Not on file   Highest education level: Not on file  Occupational History   Not on file  Tobacco Use   Smoking status: Former Smoker    Packs/day: 0.50    Years: 16.00    Pack years: 8.00    Types: Cigarettes    Quit date: 2021    Years since quitting: 0.9   Smokeless tobacco: Never Used  Building services engineer Use: Never used  Substance and Sexual Activity   Alcohol use: Yes    Comment: occasionally   Drug use: No   Sexual activity: Yes  Other Topics Concern   Not on file  Social History Narrative   Not on file   Social  Determinants of Health   Financial Resource Strain: Not on file  Food Insecurity: Not on file  Transportation Needs: Not on file  Physical Activity: Not on file  Stress: Not on file  Social Connections: Not on file  Intimate Partner Violence: Not on file    Outpatient Medications Prior to Visit  Medication Sig Dispense Refill   amLODipine (NORVASC) 10 MG tablet Take 1 tablet (10 mg total) by mouth daily. 90 tablet 1   cyclobenzaprine (FLEXERIL) 10 MG tablet Take 1 tablet (10 mg total) by mouth 2 (two) times daily as needed for muscle spasms. 20 tablet 0   EPINEPHrine 0.3 mg/0.3 mL IJ SOAJ injection Inject 0.3 mLs (0.3 mg total) into the muscle as needed for anaphylaxis. 1 each 1   hydrochlorothiazide (HYDRODIURIL) 25 MG tablet Take 1 tablet (25 mg total) by mouth daily. 90 tablet 3   Multiple Vitamin (MULTIVITAMIN WITH MINERALS) TABS tablet Take 1 tablet by mouth daily.     omeprazole (PRILOSEC) 20 MG capsule Take 1 capsule (20 mg total) by mouth 2 (two) times daily before a meal. 180 capsule 1   No facility-administered medications prior to visit.    Allergies  Allergen Reactions   Fish Allergy Anaphylaxis  Lisinopril     Blurred vision    ROS Review of Systems  Constitutional: Negative.   HENT: Negative.   Eyes: Negative.   Respiratory: Negative.   Cardiovascular: Negative.   Gastrointestinal: Negative.   Endocrine: Negative.   Genitourinary: Negative for penile pain, penile swelling, scrotal swelling and testicular pain.  Musculoskeletal: Positive for back pain.  Skin: Negative.   Allergic/Immunologic: Negative.   Neurological: Negative.   Hematological: Negative.   Psychiatric/Behavioral: Negative.       Objective:    Physical Exam Vitals and nursing note reviewed.  Constitutional:      General: He is not in acute distress.    Appearance: Normal appearance. He is not ill-appearing.     Comments: Using cane for stability  HENT:     Head:  Normocephalic and atraumatic.     Right Ear: External ear normal.     Left Ear: External ear normal.     Nose: Nose normal.     Mouth/Throat:     Mouth: Mucous membranes are moist.     Pharynx: Oropharynx is clear.  Eyes:     Extraocular Movements: Extraocular movements intact.     Conjunctiva/sclera: Conjunctivae normal.     Pupils: Pupils are equal, round, and reactive to light.  Cardiovascular:     Rate and Rhythm: Normal rate and regular rhythm.     Pulses: Normal pulses.     Heart sounds: Normal heart sounds.  Pulmonary:     Effort: Pulmonary effort is normal.     Breath sounds: Normal breath sounds.  Abdominal:     General: Abdomen is flat.     Palpations: Abdomen is soft.  Musculoskeletal:     Cervical back: Normal range of motion.     Thoracic back: Decreased range of motion.     Lumbar back: No spasms. Decreased range of motion.  Skin:    General: Skin is warm and dry.  Neurological:     General: No focal deficit present.     Mental Status: He is alert and oriented to person, place, and time.  Psychiatric:        Mood and Affect: Mood normal.        Behavior: Behavior normal.        Thought Content: Thought content normal.        Judgment: Judgment normal.     BP 138/89 (BP Location: Left Arm, Patient Position: Sitting, Cuff Size: Large)    Pulse 77    Resp 18    Ht 5\' 5"  (1.651 m)    Wt 179 lb (81.2 kg)    SpO2 99%    BMI 29.79 kg/m  Wt Readings from Last 3 Encounters:  12/12/20 179 lb (81.2 kg)  10/31/20 180 lb (81.6 kg)  09/17/20 178 lb (80.7 kg)     Health Maintenance Due  Topic Date Due   Hepatitis C Screening  Never done   COVID-19 Vaccine (1) Never done   INFLUENZA VACCINE  Never done    There are no preventive care reminders to display for this patient.  No results found for: TSH Lab Results  Component Value Date   WBC 8.8 03/22/2020   HGB 16.0 03/22/2020   HCT 48.1 03/22/2020   MCV 90.1 03/22/2020   PLT 102 (L) 03/22/2020   Lab  Results  Component Value Date   NA 139 11/20/2020   K 4.3 11/20/2020   CO2 27 11/20/2020   GLUCOSE 79 11/20/2020   BUN  19 11/20/2020   CREATININE 0.98 11/20/2020   BILITOT 0.4 03/22/2020   ALKPHOS 53 03/22/2020   AST 17 03/22/2020   ALT 15 03/22/2020   PROT 7.9 03/22/2020   ALBUMIN 4.3 03/22/2020   CALCIUM 9.6 11/20/2020   ANIONGAP 9 03/22/2020   Lab Results  Component Value Date   CHOL 156 08/17/2020   Lab Results  Component Value Date   HDL 42 08/17/2020   Lab Results  Component Value Date   LDLCALC 92 08/17/2020   Lab Results  Component Value Date   TRIG 121 08/17/2020   Lab Results  Component Value Date   CHOLHDL 3.7 08/17/2020   No results found for: HGBA1C    Assessment & Plan:   Problem List Items Addressed This Visit      Nervous and Auditory   Chronic left-sided low back pain with left-sided sciatica - Primary   Relevant Orders   DG Lumbar Spine Complete (Completed)    1. Chronic left-sided low back pain with left-sided sciatica Patient encouraged to reapply for home health financial assistance.  Patient education given on gentle stretching, ice, heat, continue using Tylenol and Flexeril as needed..  No refill needed today - DG Lumbar Spine Complete; Future - methylPREDNISolone acetate (DEPO-MEDROL) injection 80 mg   Meds ordered this encounter  Medications   methylPREDNISolone acetate (DEPO-MEDROL) injection 80 mg    I have reviewed the patient's medical history (PMH, PSH, Social History, Family History, Medications, and allergies) , and have been updated if relevant. I spent 20 minutes reviewing chart and  face to face time with patient.   Follow-up: Return if symptoms worsen or fail to improve.    Kasandra Knudsen Mayers, PA-C

## 2020-12-13 NOTE — Telephone Encounter (Signed)
-----   Message from Roney Jaffe, [place] sent at 12/12/2020  3:11 PM EST ----- Please call patient and let him know that his x-ray Showed mild disc space narrowing at L4-5 and L5-S1, states that it was similar to his prior study in 2019

## 2020-12-18 ENCOUNTER — Encounter: Payer: Self-pay | Admitting: Orthopaedic Surgery

## 2020-12-18 ENCOUNTER — Ambulatory Visit (INDEPENDENT_AMBULATORY_CARE_PROVIDER_SITE_OTHER): Payer: Self-pay | Admitting: Orthopaedic Surgery

## 2020-12-18 VITALS — BP 147/88 | HR 97 | Ht 65.0 in | Wt 180.0 lb

## 2020-12-18 DIAGNOSIS — M5442 Lumbago with sciatica, left side: Secondary | ICD-10-CM

## 2020-12-18 DIAGNOSIS — G8929 Other chronic pain: Secondary | ICD-10-CM

## 2020-12-18 NOTE — Progress Notes (Signed)
Office Visit Note   Patient: Jeremiah Curtis           Date of Birth: February 28, 1981           MRN: [id number] Visit Date: 12/18/2020              Requested by: Claiborne Rigg, NP [address] [place],  [place] [address] PCP: Claiborne Rigg, NP   Assessment & Plan: Visit Diagnoses:  1. Chronic left-sided low back pain with left-sided sciatica     Plan: We discussed options for therapy and possibly repeat MRI scan if not responding to therapy.  He is currently looking into insurance options.  He will work on a stretching program and a daily walking program.  No evidence of cauda equina syndrome and exam only shows some trace anterior tib weakness which is likely chronic.  Follow-Up Instructions: Return if symptoms worsen or fail to improve.   Orders:  No orders of the defined types were placed in this encounter.  No orders of the defined types were placed in this encounter.     Procedures: No procedures performed   Clinical Data: No additional findings.   Subjective: Chief Complaint  Patient presents with  . Lower Back - Pain    HPI 39 year old male seen in emergency room 12/06/2020 by ER staff with chronic low back pain.  He was then seen in the [facility] mobile clinic with complaints of 8 out of 10 back pain chronic worse in the last 2 weeks.  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  He states he has had chronic low back pain history of surgery in 2011 on the left.  Patient cortisone injections in the past.  Patient does have some hypertension.  Denies bowel bladder symptoms.  He is here with his girlfriend/significant other.  Patient previously had been seen by Dr.Elsner 2012 in April after surgery and was released for work after impairment rating of 8% was signed for slight anterior tib weakness.  Patient has a cane that he uses when he ambulates particularly in the last 2 weeks.  Review of Systems all other systems are noncontributory.   Objective: Vital  Signs: BP (!) 147/88   Pulse 97   Ht 5\' 5"  (1.651 m)   Wt 180 lb (81.6 kg)   BMI 29.95 kg/m   Physical Exam Constitutional:      Appearance: He is well-developed and well-nourished.  HENT:     Head: Normocephalic and atraumatic.  Eyes:     Extraocular Movements: EOM normal.     Pupils: Pupils are equal, round, and reactive to light.  Neck:     Thyroid: No thyromegaly.     Trachea: No tracheal deviation.  Cardiovascular:     Rate and Rhythm: Normal rate.  Pulmonary:     Effort: Pulmonary effort is normal.     Breath sounds: No wheezing.  Abdominal:     General: Bowel sounds are normal.     Palpations: Abdomen is soft.  Skin:    General: Skin is warm and dry.     Capillary Refill: Capillary refill takes less than 2 seconds.  Neurological:     Mental Status: He is alert and oriented to person, place, and time.  Psychiatric:        Mood and Affect: Mood and affect normal.        Behavior: Behavior normal.        Thought Content: Thought content normal.  Judgment: Judgment normal.     Ortho Exam patient has well-healed lumbar incision at the L4-5 level couple millimeters to the left of midline.  Mild sciatic notch tenderness negative straight leg raising 90 degrees.  Knee and ankle jerk are intact.  He has trace anterior tib weakness but is able to ambulate on his heels and toes. Specialty Comments:  No specialty comments available.  Imaging: CLINICAL DATA:  Low back pain with left-sided radicular symptoms  EXAM: LUMBAR SPINE - COMPLETE 4+ VIEW  COMPARISON:  September 03, 2018  FINDINGS: Frontal, lateral, spot lumbosacral lateral, and bilateral oblique views were obtained. There are 5 non-rib-bearing lumbar type vertebral bodies. There is no fracture or spondylolisthesis. There is mild disc space narrowing at L4-5 and L5-S1. Other disc spaces appear unremarkable. No appreciable facet arthropathy.  IMPRESSION: Mild disc space narrowing at L4-5 and L5-S1,  similar to prior study. No appreciable facet arthropathy. No fracture or spondylolisthesis.   Electronically Signed   By: Bretta Bang III M.D.   On: 12/12/2020 14:29    PMFS History: Patient Active Problem List   Diagnosis Date Noted  . 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 Elevated coronary artery calcium score 01/20/2020  . Atypical chest pain 12/21/2019  . Family history of early CAD 12/21/2019  . Essential hypertension 12/21/2019   Past Medical History:  Diagnosis Date  . Back symptoms, other   . Hypertension   . Idiopathic thrombocytopenic purpura (ITP) (HCC)     Family History  Problem Relation Age of Onset  . Hypertension Mother   . Hypertension Father   . Diabetes Maternal Grandmother   . Diabetes Paternal Grandmother     Past Surgical History:  Procedure Laterality Date  . BACK SURGERY    . LEG SURGERY     Social History   Occupational History  . Not on file  Tobacco Use  . Smoking status: Former Smoker    Packs/day: 0.50    Years: 16.00    Pack years: 8.00    Types: Cigarettes    Quit date: 2021    Years since quitting: 0.9  . Smokeless tobacco: Never Used  Vaping Use  . Vaping Use: Never used  Substance and Sexual Activity  . Alcohol use: Yes    Comment: occasionally  . Drug use: No  . Sexual activity: Yes

## 2020-12-19 ENCOUNTER — Encounter: Payer: Self-pay | Admitting: Cardiology

## 2020-12-19 ENCOUNTER — Other Ambulatory Visit: Payer: Self-pay

## 2020-12-19 ENCOUNTER — Ambulatory Visit: Payer: Self-pay | Admitting: Cardiology

## 2020-12-19 VITALS — BP 144/103 | HR 94 | Resp 16 | Ht 65.0 in | Wt 182.0 lb

## 2020-12-19 DIAGNOSIS — I10 Essential (primary) hypertension: Secondary | ICD-10-CM

## 2020-12-19 DIAGNOSIS — R931 Abnormal findings on diagnostic imaging of heart and coronary circulation: Secondary | ICD-10-CM

## 2020-12-19 MED FILL — HYDROCHLOROTHIAZIDE 25 MG T: 25 | 30 days supply | Qty: 30 | Fill #1

## 2020-12-19 NOTE — Progress Notes (Signed)
Patient referred by Jeremiah Pounds, NP for chest pain  Subjective:   Jeremiah Curtis, male    DOB: August 11, 1981, 39 y.o.   MRN: [id number]   Chief Complaint  Patient presents with  . Elevated coronary artery calcium score  . Follow-up    40 year old African-American man with hypertension, h/o COVID in Dec 2020, elevated calcium score.  Patient is recently been struggling with constant upper back pain. This has coincided with elevated blood pressure.   On a separate note, he has made significant changes to his diet and cut down dairy and meat intake completely.   Current Outpatient Medications on File Prior to Visit  Medication Sig Dispense Refill  . amLODipine (NORVASC) 10 MG tablet Take 1 tablet (10 mg total) by mouth daily. 90 tablet 1  . cyclobenzaprine (FLEXERIL) 10 MG tablet Take 1 tablet (10 mg total) by mouth 2 (two) times daily as needed for muscle spasms. 20 tablet 0  . EPINEPHrine 0.3 mg/0.3 mL IJ SOAJ injection Inject 0.3 mLs (0.3 mg total) into the muscle as needed for anaphylaxis. 1 each 1  . hydrochlorothiazide (HYDRODIURIL) 25 MG tablet Take 1 tablet (25 mg total) by mouth daily. 90 tablet 3  . Multiple Vitamin (MULTIVITAMIN WITH MINERALS) TABS tablet Take 1 tablet by mouth daily.    Marland Kitchen omeprazole (PRILOSEC) 20 MG capsule Take 1 capsule (20 mg total) by mouth 2 (two) times daily before a meal. 180 capsule 1   No current facility-administered medications on file prior to visit.    Cardiovascular and other pertinent studies:  EKG 08/16/2020: Sinus rhythm 75 bpm Inferior T wave inversion, unchanged compared to previous EKG  Echocardiogram 01/05/2020: Normal LV systolic function with EF 61%. Moderate concentric hypertrophy of the left ventricle. Left ventricle cavity is normal in size. Normal global wall motion. Normal diastolic filling pattern.  Mild mitral valve thickening. Trace mitral regurgitation. Trace tricuspid regurgitation. Inadequate TR jet to estimate  pulmonary artery systolic pressure. Normal right atrial pressure.   Lexiscan Sestamibi stress test 12/28/2019: No previous exam available for comparison. Lexiscan/walking nuclear stress test performed using 1-day protocol. Stress EKG is non-diagnostic, as this is pharmacological stress test. In addition, stress EKG showed sinus tachycardia, inferolateral T wave inversion with no worsening.  Myocardial perfusion imaging is normal. Left ventricular ejection fraction is 58% with normal wall motion.  Low risk study.  12/21/2019: Calcium score: 22. LM: 0. LAD: 14. Cx: 8. RCA: 0  Heart size is Normal Visible lung fields are: clear Lymph nodes: Not enlarged Abdomen: No visible lesions   EKG 12/02/2019: Sinus rhythm 85 bpm. Borderline for LVH. Inferolateral T wave inversion, possibly due to LVH. Uncahnged compared to previous EKG's.   Recent labs: 11/30/2019: Glucose 107, BUN/Cr 16/0.96. EGFR >60. Na/K 135/3.5. Rest of the CMP normal H/H 16/48. MCV 89.7. Platelets 84  Results for Jeremiah, Curtis (MRN [id number]) as of 12/21/2019 08:13  Ref. Range 11/30/2019 12:26 11/30/2019 14:53  Troponin I (High Sensitivity) Latest Ref Range: <18 ng/L 31 (H) 32 (H)   08/29/2019: Chol 157, TG 46, HDL 54, LDL 93    Review of Systems  Cardiovascular: Positive for chest pain. Negative for dyspnea on exertion, leg swelling, palpitations and syncope.        Vitals:   12/19/20 0934 12/19/20 0936  BP: (!) 153/104 (!) 144/103  Pulse: 89 94  Resp: 16   SpO2: 99% 100%     Body mass index is 30.29 kg/m. Filed Weights   12/19/20 [phone]  Weight: 182 lb (82.6 kg)     Objective:   Physical Exam Vitals and nursing note reviewed.  Constitutional:      Appearance: He is well-developed.  Neck:     Vascular: No JVD.  Cardiovascular:     Rate and Rhythm: Normal rate and regular rhythm.     Pulses: Intact distal pulses.     Heart sounds: Normal heart sounds. No murmur heard.   Pulmonary:      Effort: Pulmonary effort is normal.     Breath sounds: Normal breath sounds. No wheezing or rales.          Assessment & Recommendations:    39 year old African-American man with hypertension, h/o COVID in Dec 2020, elevated calcium score.  Elevated calcium score: Mildly elevated calcium score 22. Continue risk factor modification. He has made significant changes to his diet and wants to recheck lipid panel before considering statin therapy. Okay with me.  Hypertension: Elevated BP today.  Has significant back pain, which could be contributing. Continue amlodipine 10 mg, hydrochlorothiazide 50 mg daily.  F/u in 3 months.  Jeremiah Mormon, MD [facility]. [facility] Pager: [phone] Office: [phone]

## 2020-12-21 LAB — BASIC METABOLIC PANEL
BUN/Creatinine Ratio: 18 (ref 9–20)
BUN: 19 mg/dL (ref 6–20)
CO2: 24 mmol/L (ref 20–29)
Calcium: 9.9 mg/dL (ref 8.7–10.2)
Chloride: 101 mmol/L (ref 96–106)
Creatinine, Ser: 1.07 mg/dL (ref 0.76–1.27)
GFR calc Af Amer: 101 mL/min/{1.73_m2} (ref >59)
GFR calc non Af Amer: 87 mL/min/{1.73_m2} (ref >59)
Glucose: 84 mg/dL (ref 65–99)
Potassium: 4.4 mmol/L (ref 3.5–5.2)
Sodium: 141 mmol/L (ref 134–144)

## 2020-12-21 LAB — LIPID PANEL
Chol/HDL Ratio: 3.2 ratio (ref 0.0–5.0)
Cholesterol, Total: 155 mg/dL (ref 100–199)
HDL: 48 mg/dL (ref >39)
LDL Chol Calc (NIH): 92 mg/dL (ref 0–99)
Triglycerides: 76 mg/dL (ref 0–149)
VLDL Cholesterol Cal: 15 mg/dL (ref 5–40)

## 2020-12-31 ENCOUNTER — Telehealth: Payer: Self-pay | Admitting: Nurse Practitioner

## 2020-12-31 NOTE — Telephone Encounter (Signed)
Patient is calling to request a referral for his   Orthopedic Surgery  Chronic left-sided low back pain with left-sided sciatica    Patient was advised since he only has family planning medicaid - no other insurance- patient would like to know where he can go for the MRI since he has no insurance?  Please advise CB-  [phone]

## 2021-01-03 NOTE — Telephone Encounter (Signed)
Spoke to patient and informed he need to apply for the [place]FA. Informed patient there are no financial that will him help pay for the MRI unless he gets approve for the [place]. Pt. Understood.  Pt. Would like PCP advise if he needs an MRI imaging done for his back.

## 2021-01-04 NOTE — Telephone Encounter (Signed)
If he wants to know why he is having back pain he needs to have the MRI done

## 2021-01-08 MED FILL — AMLODIPINE BESYLATE 10 MG T: 10 | 30 days supply | Qty: 30 | Fill #2

## 2021-01-08 NOTE — Telephone Encounter (Signed)
Patient is scheduled to apply for [place] on 01/09/2021.

## 2021-01-09 ENCOUNTER — Ambulatory Visit: Payer: Medicaid Other

## 2021-01-22 MED FILL — HYDROCHLOROTHIAZIDE 25 MG T: 25 | 30 days supply | Qty: 30 | Fill #2

## 2021-01-23 ENCOUNTER — Ambulatory Visit: Payer: Medicaid Other | Admitting: Pharmacist

## 2021-01-30 ENCOUNTER — Ambulatory Visit (INDEPENDENT_AMBULATORY_CARE_PROVIDER_SITE_OTHER): Payer: 59 | Admitting: Orthopaedic Surgery

## 2021-01-30 ENCOUNTER — Encounter: Payer: Self-pay | Admitting: Orthopaedic Surgery

## 2021-01-30 VITALS — BP 143/97 | HR 73 | Ht 65.0 in | Wt 172.0 lb

## 2021-01-30 DIAGNOSIS — M5442 Lumbago with sciatica, left side: Secondary | ICD-10-CM

## 2021-01-30 DIAGNOSIS — G8929 Other chronic pain: Secondary | ICD-10-CM

## 2021-01-30 MED ORDER — DIAZEPAM 5 MG PO TABS: ORAL_TABLET | ORAL | 0 refills | Status: DC

## 2021-01-30 NOTE — Progress Notes (Signed)
Office Visit Note   Patient: Jeremiah Curtis           Date of Birth: 1981-10-05           MRN: [id number] Visit Date: 01/30/2021              Requested by: Claiborne Rigg, NP [address] [place],  [place] [address] PCP: Claiborne Rigg, NP   Assessment & Plan: Visit Diagnoses:  1. Chronic left-sided low back pain with left-sided sciatica     Plan: Will list Cipro for MRI scan lumbar with and without contrast rule out recurrent disc protrusion L4-5 level on the left.  Office follow-up after scan.  Follow-Up Instructions: No follow-ups on file.   Orders:  Orders Placed This Encounter  Procedures   MR Lumbar Spine W Wo Contrast   Meds ordered this encounter  Medications   diazepam (VALIUM) 5 MG tablet    Sig: Take as directed prior to procedure. MUST HAVE DRIVER.    Dispense:  3 tablet    Refill:  0      Procedures: No procedures performed   Clinical Data: No additional findings.   Subjective: Chief Complaint  Patient presents with   Lower Back - Pain, Follow-up    HPI 40 year old male returns with ongoing problems with back pain left leg pain with left leg weakness.  He is having ambulate with a cane to avoid falling.  He is taken anti-inflammatories without relief also muscle relaxant.  Patient had surgery by Dr. Danielle Dess 2012 on the left at L4-5 level was released with impairment rating of 8% with slight anterior tib weakness.  Patient states his pain is worse and he is now obtain some insurance and would like to proceed with MRI scan.  Review of Systems all other systems are noncontributory to HPI unchanged from 12/18/2020.   Objective: Vital Signs: BP (!) 143/97    Pulse 73    Ht 5\' 5"  (1.651 m)    Wt 172 lb (78 kg)    BMI 28.62 kg/m   Physical Exam Constitutional:      Appearance: He is well-developed and well-nourished.  HENT:     Head: Normocephalic and atraumatic.  Eyes:     Extraocular Movements: EOM normal.     Pupils: Pupils are  equal, round, and reactive to light.  Neck:     Thyroid: No thyromegaly.     Trachea: No tracheal deviation.  Cardiovascular:     Rate and Rhythm: Normal rate.  Pulmonary:     Effort: Pulmonary effort is normal.     Breath sounds: No wheezing.  Abdominal:     General: Bowel sounds are normal.     Palpations: Abdomen is soft.  Skin:    General: Skin is warm and dry.     Capillary Refill: Capillary refill takes less than 2 seconds.  Neurological:     Mental Status: He is alert and oriented to person, place, and time.  Psychiatric:        Mood and Affect: Mood and affect normal.        Behavior: Behavior normal.        Thought Content: Thought content normal.        Judgment: Judgment normal.     Ortho Exam well-healed incision L4-5.  Knee and ankle jerk are intact his pain with straight leg raising 90 degrees pain with popliteal compression.  Trace anterior tib weakness on the left side but is able  to heel and toe walk.  Specialty Comments:  No specialty comments available.  Imaging: No results found.   PMFS History: Patient Active Problem List   Diagnosis Date Noted   Chronic left-sided low back pain with left-sided sciatica 12/12/2020   Elevated coronary artery calcium score 01/20/2020   Atypical chest pain 12/21/2019   Family history of early CAD 12/21/2019   Essential hypertension 12/21/2019   Past Medical History:  Diagnosis Date   Back symptoms, other    Hypertension    Idiopathic thrombocytopenic purpura (ITP) (HCC)     Family History  Problem Relation Age of Onset   Hypertension Mother    Hypertension Father    Diabetes Maternal Grandmother    Diabetes Paternal Grandmother     Past Surgical History:  Procedure Laterality Date   BACK SURGERY     LEG SURGERY     Social History   Occupational History   Not on file  Tobacco Use   Smoking status: Former Smoker    Packs/day: 0.50    Years: 16.00    Pack years: 8.00    Types:  Cigarettes    Quit date: 2021    Years since quitting: 1.0   Smokeless tobacco: Never Used  Building services engineer Use: Never used  Substance and Sexual Activity   Alcohol use: Yes    Comment: occasionally   Drug use: No   Sexual activity: Yes

## 2021-02-01 ENCOUNTER — Encounter: Payer: Self-pay | Admitting: Nurse Practitioner

## 2021-02-01 ENCOUNTER — Ambulatory Visit: Payer: 59 | Attending: Nurse Practitioner | Admitting: Nurse Practitioner

## 2021-02-01 ENCOUNTER — Other Ambulatory Visit: Payer: Self-pay | Admitting: Nurse Practitioner

## 2021-02-01 ENCOUNTER — Other Ambulatory Visit: Payer: Self-pay

## 2021-02-01 DIAGNOSIS — I10 Essential (primary) hypertension: Secondary | ICD-10-CM

## 2021-02-01 DIAGNOSIS — R350 Frequency of micturition: Secondary | ICD-10-CM

## 2021-02-01 DIAGNOSIS — Z130 Encounter for screening for diseases of the blood and blood-forming organs and certain disorders involving the immune mechanism: Secondary | ICD-10-CM

## 2021-02-01 DIAGNOSIS — K219 Gastro-esophageal reflux disease without esophagitis: Secondary | ICD-10-CM

## 2021-02-01 DIAGNOSIS — F419 Anxiety disorder, unspecified: Secondary | ICD-10-CM

## 2021-02-01 DIAGNOSIS — Z131 Encounter for screening for diabetes mellitus: Secondary | ICD-10-CM

## 2021-02-01 LAB — POCT URINALYSIS DIP (CLINITEK)
Bilirubin, UA: NEGATIVE
Blood, UA: NEGATIVE
Glucose, UA: NEGATIVE mg/dL
Ketones, POC UA: NEGATIVE mg/dL
Leukocytes, UA: NEGATIVE
Nitrite, UA: NEGATIVE
POC PROTEIN,UA: NEGATIVE
Spec Grav, UA: 1.02 (ref 1.010–1.025)
Urobilinogen, UA: 0.2 E.U./dL
pH, UA: 7 (ref 5.0–8.0)

## 2021-02-01 MED ORDER — HYDROXYZINE HCL 25 MG PO TABS: 25.0000 mg | ORAL_TABLET | Freq: Three times a day (TID) | ORAL | 1 refills | Status: DC | PRN

## 2021-02-01 MED ORDER — OMEPRAZOLE 20 MG PO CPDR: 20.0000 mg | DELAYED_RELEASE_CAPSULE | Freq: Two times a day (BID) | ORAL | 1 refills | Status: DC

## 2021-02-01 MED ORDER — HYDROCHLOROTHIAZIDE 25 MG PO TABS: 25.0000 mg | ORAL_TABLET | Freq: Every day | ORAL | 3 refills | Status: DC

## 2021-02-01 MED ORDER — AMLODIPINE BESYLATE 10 MG PO TABS: 10.0000 mg | ORAL_TABLET | Freq: Every day | ORAL | 1 refills | Status: DC

## 2021-02-01 MED FILL — OMEPRAZOLE 20 MG CAP: 20 | 30 days supply | Qty: 60 | Fill #0

## 2021-02-01 MED FILL — AMLODIPINE BESYLATE 10 MG T: 10 | 30 days supply | Qty: 30 | Fill #0

## 2021-02-01 MED FILL — hydrOXYzine HCL 25 MG TABS: 25 | 20 days supply | Qty: 60 | Fill #0

## 2021-02-01 NOTE — Progress Notes (Signed)
Virtual Visit via Telephone Note Due to national recommendations of social distancing due to COVID 19, telehealth visit is felt to be most appropriate for this patient at this time.  I discussed the limitations, risks, security and privacy concerns of performing an evaluation and management service by telephone and the availability of in person appointments. I also discussed with the patient that there may be a patient responsible charge related to this service. The patient expressed understanding and agreed to proceed.    I connected with [name] on 02/01/21  at  10:30 AM EST  EDT by telephone and verified that I am speaking with the correct person using two identifiers.   Consent I discussed the limitations, risks, security and privacy concerns of performing an evaluation and management service by telephone and the availability of in person appointments. I also discussed with the patient that there may be a patient responsible charge related to this service. The patient expressed understanding and agreed to proceed.   Location of Patient: Private Residence   Location of Provider: [facility]    Persons participating in Telemedicine visit: Bertram Denver FNP-BC [place] CMA [place]    History of Present Illness: Telemedicine visit for: Follow Up He has a past medical history of chronic back pain with sciatica (left-sided), other, Hypertension, and Idiopathic thrombocytopenic purpura (oncology will reevaluate if platelets are less than 50)   He is currently seeing Dr. Kevan Ny for his back pain and MRI is pending.   Essential Hypertension Most recent home blood pressure reading was 138/85.  Endorses intermittent chest pain and shortness of breath which he attributes to anxiety.  Lasts about 10 minutes and resolves with deep breathing and imagery.  Denies palpitations, lightheadedness, dizziness, headaches or BLE edema.  He is taking  amlodipine 10 mg daily and hydrochlorothiazide 25 mg daily as prescribed. BP Readings from Last 3 Encounters:  01/30/21 (!) 143/97  12/19/20 (!) 144/103  12/18/20 (!) 147/88   GAD 7 : Generalized Anxiety Score 10/31/2020 07/30/2020 10/10/2019 06/28/2019  Nervous, Anxious, on Edge 1 3 1 1   Control/stop worrying 0 0 0 0  Worry too much - different things 0 2 0 2  Trouble relaxing 0 2 0 0  Restless 0 0 0 1  Easily annoyed or irritable 1 0 1 3  Afraid - awful might happen 0 2 0 0  Total GAD 7 Score 2 9 2 7       Past Medical History:  Diagnosis Date  . Back symptoms, other   . Hypertension   . Idiopathic thrombocytopenic purpura (ITP) (HCC)     Past Surgical History:  Procedure Laterality Date  . BACK SURGERY    . LEG SURGERY      Family History  Problem Relation Age of Onset  . Hypertension Mother   . Hypertension Father   . Diabetes Maternal Grandmother   . Diabetes Paternal Grandmother     Social History   Socioeconomic History  . Marital status: Single    Spouse name: Not on file  . Number of children: 3  . Years of education: Not on file  . Highest education level: Not on file  Occupational History  . Not on file  Tobacco Use  . Smoking status: Former Smoker    Packs/day: 0.50    Years: 16.00    Pack years: 8.00    Types: Cigarettes    Quit date: 2021    Years since quitting: 1.0  .  Smokeless tobacco: Never Used  Vaping Use  . Vaping Use: Never used  Substance and Sexual Activity  . Alcohol use: Yes    Comment: occasionally  . Drug use: No  . Sexual activity: Yes  Other Topics Concern  . Not on file  Social History Narrative  . Not on file   Social Determinants of Health   Financial Resource Strain: Not on file  Food Insecurity: Not on file  Transportation Needs: Not on file  Physical Activity: Not on file  Stress: Not on file  Social Connections: Not on file     Observations/Objective: Awake, alert and oriented x 3   Review of Systems   Constitutional: Negative for fever, malaise/fatigue and weight loss.  HENT: Negative.  Negative for nosebleeds.   Eyes: Negative.  Negative for blurred vision, double vision and photophobia.  Respiratory: Negative.  Negative for cough and shortness of breath.   Cardiovascular: Negative.  Negative for chest pain, palpitations and leg swelling.  Gastrointestinal: Positive for heartburn (Well-controlled with PPI). Negative for nausea and vomiting.  Musculoskeletal: Positive for back pain. Negative for myalgias.  Neurological: Positive for sensory change and weakness. Negative for dizziness, focal weakness, seizures and headaches.  Psychiatric/Behavioral: Negative.  Negative for suicidal ideas.    Assessment and Plan: Mccrae was seen today for follow-up.  Diagnoses and all orders for this visit:  Essential hypertension -     amLODipine (NORVASC) 10 MG tablet; Take 1 tablet (10 mg total) by mouth daily. -     hydrochlorothiazide (HYDRODIURIL) 25 MG tablet; Take 1 tablet (25 mg total) by mouth daily. Continue all antihypertensives as prescribed.  Remember to bring in your blood pressure log with you for your follow up appointment.  DASH/Mediterranean Diets are healthier choices for HTN.    GERD without esophagitis -     omeprazole (PRILOSEC) 20 MG capsule; Take 1 capsule (20 mg total) by mouth 2 (two) times daily before a meal. INSTRUCTIONS: Avoid GERD Triggers: acidic, spicy or fried foods, caffeine, coffee, sodas,  alcohol and chocolate.   Anxiety -     hydrOXYzine (ATARAX/VISTARIL) 25 MG tablet; Take 1 tablet (25 mg total) by mouth 3 (three) times daily as needed.     Follow Up Instructions Return in about 3 months (around 05/01/2021).     I discussed the assessment and treatment plan with the patient. The patient was provided an opportunity to ask questions and all were answered. The patient agreed with the plan and demonstrated an understanding of the instructions.   The patient  was advised to call back or seek an in-person evaluation if the symptoms worsen or if the condition fails to improve as anticipated.  I provided 16 minutes of non-face-to-face time during this encounter including median intraservice time, reviewing previous notes, labs, imaging, medications and explaining diagnosis and management.  Claiborne Rigg, FNP-BC

## 2021-02-01 NOTE — Addendum Note (Signed)
Addended byVidal Schwalbe on: 02/01/2021 03:00 PM   Modules accepted: Orders

## 2021-02-11 ENCOUNTER — Ambulatory Visit: Payer: Self-pay | Admitting: *Deleted

## 2021-02-11 NOTE — Telephone Encounter (Signed)
Per initial encounter, "Pt has been having slight numbness in left fingers x 4 days. Please advise no openings this week at [facility]"; contacted the pt to discuss his symptoms; the pt says it is intermittent; his affected fingers are his left pinky and ring fingers; his numbness came about suddenly and woke him up in the middle of the night; recommendations made per nurse triage protocol; he verbalized understanding but he would like to be seen in the office; thehe sees Bertram Denver, [facility]; he can be contacted at [phone]; will roue to office for final disposition.  Reason for Disposition . [1] Numbness (i.e., loss of sensation) of the face, arm / hand, or leg / foot on one side of the body AND [2] sudden onset AND [3] brief (now gone)  Answer Assessment - Initial Assessment Questions 1. SYMPTOM: "What is the main symptom you are concerned about?" (e.g., weakness, numbness)     Numbness in left hand  2. ONSET: "When did this start?" (minutes, hours, days; while sleeping)   02/08/21 3. LAST NORMAL: "When was the last time you were normal (no symptoms)?"     02/07/21 4. PATTERN "Does this come and go, or has it been constant since it started?"  "Is it present now?"    Intermittent; currently experiencing numbness for 40 min 5. CARDIAC SYMPTOMS: "Have you had any of the following symptoms: chest pain, difficulty breathing, palpitations?"     no 6. NEUROLOGIC SYMPTOMS: "Have you had any of the following symptoms: headache, dizziness, vision loss, double vision, changes in speech, unsteady on your feet?"     no 7. OTHER SYMPTOMS: "Do you have any other symptoms?"     Pain in left arm (elbow to hand) that started 02/08/21 8. PREGNANCY: "Is there any chance you are pregnant?" "When was your last menstrual period?"     n/a  Protocols used: NEUROLOGIC DEFICIT-A-AH

## 2021-02-11 NOTE — Telephone Encounter (Signed)
Patient informed of the [facility] location on Tues 2/15 and Wed. 2/16.  Also encouraged to call out office to see if there are any cancellations.

## 2021-02-18 MED FILL — HYDROCHLOROTHIAZIDE 25 MG T: 25 | 30 days supply | Qty: 30 | Fill #3

## 2021-02-21 ENCOUNTER — Ambulatory Visit
Admission: RE | Admit: 2021-02-21 | Discharge: 2021-02-21 | Disposition: A | Discharge status: Home / Self Care | Payer: 59 | Source: Ambulatory Visit | Attending: Orthopaedic Surgery | Admitting: Orthopaedic Surgery

## 2021-02-21 DIAGNOSIS — G8929 Other chronic pain: Secondary | ICD-10-CM

## 2021-02-21 MED ORDER — GADOBENATE DIMEGLUMINE 529 MG/ML IV SOLN
15.0000 mL | Freq: Once | INTRAVENOUS | Status: AC | PRN
  Administered 2021-02-21: 15 mL via INTRAVENOUS

## 2021-03-05 ENCOUNTER — Ambulatory Visit (INDEPENDENT_AMBULATORY_CARE_PROVIDER_SITE_OTHER): Payer: 59 | Admitting: Orthopaedic Surgery

## 2021-03-05 ENCOUNTER — Other Ambulatory Visit: Payer: Self-pay

## 2021-03-05 ENCOUNTER — Encounter: Payer: Self-pay | Admitting: Orthopaedic Surgery

## 2021-03-05 VITALS — BP 138/91 | HR 82 | Ht 65.0 in | Wt 172.0 lb

## 2021-03-05 DIAGNOSIS — M5442 Lumbago with sciatica, left side: Secondary | ICD-10-CM

## 2021-03-05 DIAGNOSIS — G8929 Other chronic pain: Secondary | ICD-10-CM

## 2021-03-05 NOTE — Progress Notes (Addendum)
Office Visit Note   Patient: Jeremiah Curtis           Date of Birth: [birth year]           MRN: [id number] Visit Date: 03/05/2021              Requested by: Claiborne Rigg, NP [address] [place],  [place] [address] PCP: Claiborne Rigg, NP   Assessment & Plan: Visit Diagnoses:  1. Chronic left-sided low back pain with left-sided sciatica     Plan: Patient has some disc protrusion on the left at L4-5 and also larger disc protrusion on the left at L5-S1 similar to his 2014 postop MRI that had been obtained by Dr. Danielle Dess.  Would recommend proceeding with an epidural on the left side at both levels for his disc protrusion at L4-5 and L5-S1.  He can follow-up with me after the epidurals.  Follow-Up Instructions: No follow-ups on file.   Orders:  Orders Placed This Encounter  Procedures  . Ambulatory referral to Physical Medicine Rehab   No orders of the defined types were placed in this encounter.     Procedures: No procedures performed   Clinical Data: No additional findings.   Subjective: Chief Complaint  Patient presents with  . Lower Back - Follow-up    MRI Review    HPI 40 year old male returns with ongoing problems with back pain.  He has been Press photographer with a cane.  Previous surgery by Dr. Danielle Dess 2012 Worker's Comp. related at L4-5 with impairment rating of 8% for slight anterior tib weakness.  MRI scan has been obtained and is available for review today with the patient.  Patient states he continues to have back pain pain on the left leg that radiates down to his foot.  Review of Systems all other systems noncontributory to HPI.   Objective: Vital Signs: BP (!) 138/91 (BP Location: Left Arm, Patient Position: Sitting)   Pulse 82   Ht 5\' 5"  (1.651 m)   Wt 172 lb (78 kg)   BMI 28.62 kg/m   Physical Exam Constitutional:      Appearance: He is well-developed and well-nourished.  HENT:     Head: Normocephalic and atraumatic.  Eyes:      Extraocular Movements: EOM normal.     Pupils: Pupils are equal, round, and reactive to light.  Neck:     Thyroid: No thyromegaly.     Trachea: No tracheal deviation.  Cardiovascular:     Rate and Rhythm: Normal rate.  Pulmonary:     Effort: Pulmonary effort is normal.     Breath sounds: No wheezing.  Abdominal:     General: Bowel sounds are normal.     Palpations: Abdomen is soft.  Skin:    General: Skin is warm and dry.     Capillary Refill: Capillary refill takes less than 2 seconds.  Neurological:     Mental Status: He is alert and oriented to person, place, and time.  Psychiatric:        Mood and Affect: Mood and affect normal.        Behavior: Behavior normal.        Thought Content: Thought content normal.        Judgment: Judgment normal.     Ortho Exam lumbar incision left than right some trochanteric bursal tenderness.  Trace EHL weakness on the left with manual exam but he also tends to contract his gastrocsoleus with counter resistance on the left  not on the right.  He is able to heel and toe walk which demonstrates excellent anterior tib strength.  Pedal pulses are normal.  Pain with straight leg raising at 90 degrees.  Specialty Comments:  No specialty comments available.  Imaging: CLINICAL DATA:  Low back pain. Recurrent left leg pain and weakness. History of back surgery.  EXAM: MRI LUMBAR SPINE WITHOUT AND WITH CONTRAST  TECHNIQUE: Multiplanar and multiecho pulse sequences of the lumbar spine were obtained without and with intravenous contrast.  CONTRAST:  21mL MULTIHANCE GADOBENATE DIMEGLUMINE 529 MG/ML IV SOLN  COMPARISON:  Lumbar MRI 02/24/2013  FINDINGS: Segmentation:  Normal  Alignment:  Normal  Vertebrae: Negative for fracture or mass. Mild bone marrow edema in the endplates at L5-S1 related to disc degeneration.  Conus medullaris and cauda equina: Conus extends to the L2 level. Conus and cauda equina appear normal.  Paraspinal  and other soft tissues: Negative for paraspinous mass, adenopathy, fluid collection.  Disc levels:  L1-2: Negative  L2-3: Negative  L3-4: Negative  L4-5: Left laminotomy unchanged. Recurrent left-sided disc protrusion and associated spurring, unchanged from 2014. There is left lateral recess stenosis and impingement left L5 nerve root similar to the prior study. Spinal canal adequate in size.  L5-S1: Moderately large left disc protrusion with left S1 nerve root impingement. Findings similar to the prior study. Mild to moderate foraminal stenosis bilaterally due to endplate spurring and mild facet degeneration.  IMPRESSION: 1. Left laminotomy L4-5. 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 Moderately large left disc protrusion L5-S1 with left S1 nerve root impingement. No change from 2014 MRI.   Electronically Signed   By: Marlan Palau M.D.   On: 02/22/2021 08:43   PMFS History: Patient Active Problem List   Diagnosis Date Noted  . Mixed hyperlipidemia 03/20/2021  . 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 Elevated coronary artery calcium score 01/20/2020  . Atypical chest pain 12/21/2019  . Family history of early CAD 12/21/2019  . Essential hypertension 12/21/2019   Past Medical History:  Diagnosis Date  . Back symptoms, other   . Hypertension   . Idiopathic thrombocytopenic purpura (ITP) (HCC)     Family History  Problem Relation Age of Onset  . Hypertension Mother   . Hypertension Father   . Diabetes Maternal Grandmother   . Diabetes Paternal Grandmother     Past Surgical History:  Procedure Laterality Date  . BACK SURGERY    . LEG SURGERY     Social History   Occupational History  . Not on file  Tobacco Use  . Smoking status: Former Smoker    Packs/day: 0.50    Years: 16.00    Pack years: 8.00    Types: Cigarettes    Quit date: 2021    Years  since quitting: 1.2  . Smokeless tobacco: Never Used  Vaping Use  . Vaping Use: Never used  Substance and Sexual Activity  . Alcohol use: Yes    Comment: occasionally  . Drug use: No  . Sexual activity: Yes

## 2021-03-07 MED FILL — AMLODIPINE BESYLATE 10 MG T: 10 | 30 days supply | Qty: 30 | Fill #1

## 2021-03-08 ENCOUNTER — Telehealth: Payer: Self-pay | Admitting: Physical Medicine and Rehabilitation

## 2021-03-08 NOTE — Telephone Encounter (Signed)
[employer] called asking for a tax ID and MPI for an authorization for an epidural inj  [employer]# [id number]  [facility]# [phone]* Can leave message with anyone.

## 2021-03-13 NOTE — Telephone Encounter (Signed)
Pt insurance is still PENDING. 

## 2021-03-20 ENCOUNTER — Other Ambulatory Visit: Payer: Self-pay | Admitting: Cardiology

## 2021-03-20 ENCOUNTER — Encounter: Payer: Self-pay | Admitting: Cardiology

## 2021-03-20 ENCOUNTER — Other Ambulatory Visit: Payer: Self-pay

## 2021-03-20 ENCOUNTER — Ambulatory Visit: Payer: 59 | Admitting: Cardiology

## 2021-03-20 VITALS — BP 145/85 | HR 94 | Ht 65.0 in | Wt 183.0 lb

## 2021-03-20 DIAGNOSIS — I10 Essential (primary) hypertension: Secondary | ICD-10-CM

## 2021-03-20 DIAGNOSIS — R931 Abnormal findings on diagnostic imaging of heart and coronary circulation: Secondary | ICD-10-CM

## 2021-03-20 DIAGNOSIS — E782 Mixed hyperlipidemia: Secondary | ICD-10-CM

## 2021-03-20 MED ORDER — METOPROLOL SUCCINATE ER 50 MG PO TB24: 50.0000 mg | ORAL_TABLET | Freq: Every day | ORAL | 3 refills | Status: DC

## 2021-03-20 MED ORDER — ROSUVASTATIN CALCIUM 10 MG PO TABS: 10.0000 mg | ORAL_TABLET | Freq: Every day | ORAL | 3 refills | Status: DC

## 2021-03-20 MED ORDER — NITROGLYCERIN 0.4 MG SL SUBL: 0.4000 mg | SUBLINGUAL_TABLET | SUBLINGUAL | 3 refills | Status: DC | PRN

## 2021-03-20 MED FILL — METOPROLOL SUCCINATE ER 50: 50 | 30 days supply | Qty: 30 | Fill #0

## 2021-03-20 MED FILL — ROSUVASTATIN CALCIUM 10 MG: 10 | 30 days supply | Qty: 30 | Fill #0

## 2021-03-20 MED FILL — NITROGLYCERIN 0.4 MG TAB SL: 0.4 | 25 days supply | Qty: 25 | Fill #0

## 2021-03-20 NOTE — Progress Notes (Signed)
Patient referred by Jeremiah Pounds, NP for chest pain  Subjective:   Jeremiah Curtis, male    DOB: Feb 23, 1981, 40 y.o.   MRN: [id number]   Chief Complaint  Patient presents with  . Hypertension  . Coronary Artery Disease  . Hyperlipidemia  . Follow-up    3 month  . Numbness    40 year old African-American man with hypertension, h/o COVID in Dec 2020, elevated calcium score.  Patient has had occasional headache and shoulder pain, but denies any chest pain. BP remains elevated. Recent lipid panel reviewed with the patient, details below.   Current Outpatient Medications on File Prior to Visit  Medication Sig Dispense Refill  . amLODipine (NORVASC) 10 MG tablet Take 1 tablet (10 mg total) by mouth daily. 90 tablet 1  . cyclobenzaprine (FLEXERIL) 10 MG tablet Take 1 tablet (10 mg total) by mouth 2 (two) times daily as needed for muscle spasms. 20 tablet 0  . diazepam (VALIUM) 5 MG tablet Take as directed prior to procedure. MUST HAVE DRIVER. 3 tablet 0  . EPINEPHrine 0.3 mg/0.3 mL IJ SOAJ injection Inject 0.3 mLs (0.3 mg total) into the muscle as needed for anaphylaxis. 1 each 1  . hydrochlorothiazide (HYDRODIURIL) 25 MG tablet Take 1 tablet (25 mg total) by mouth daily. 90 tablet 3  . Multiple Vitamin (MULTIVITAMIN WITH MINERALS) TABS tablet Take 1 tablet by mouth daily.    Marland Kitchen omeprazole (PRILOSEC) 20 MG capsule Take 1 capsule (20 mg total) by mouth 2 (two) times daily before a meal. 180 capsule 1   No current facility-administered medications on file prior to visit.    Cardiovascular and other pertinent studies:  EKG 03/20/2021: Sinus rhythm 78 bpm  Inferior T wave inversion, consider ischemia Unchanged compared to previous EKG  Echocardiogram 01/05/2020: Normal LV systolic function with EF 61%. Moderate concentric hypertrophy of the left ventricle. Left ventricle cavity is normal in size. Normal global wall motion. Normal diastolic filling pattern.  Mild mitral valve  thickening. Trace mitral regurgitation. Trace tricuspid regurgitation. Inadequate TR jet to estimate pulmonary artery systolic pressure. Normal right atrial pressure.   Lexiscan Sestamibi stress test 12/28/2019: No previous exam available for comparison. Lexiscan/walking nuclear stress test performed using 1-day protocol. Stress EKG is non-diagnostic, as this is pharmacological stress test. In addition, stress EKG showed sinus tachycardia, inferolateral T wave inversion with no worsening.  Myocardial perfusion imaging is normal. Left ventricular ejection fraction is 58% with normal wall motion.  Low risk study.  12/21/2019: Calcium score: 22. LM: 0. LAD: 14. Cx: 8. RCA: 0  Heart size is Normal Visible lung fields are: clear Lymph nodes: Not enlarged Abdomen: No visible lesions   EKG 12/02/2019: Sinus rhythm 85 bpm. Borderline for LVH. Inferolateral T wave inversion, possibly due to LVH. Uncahnged compared to previous EKG's.   Recent labs: 12/20/2020: Glucose 84, BUN/Cr 19/1.07. EGFR 101. Na/K 141/4.4.  Chol 155, TG 76, HDL 48, LDL 92  11/30/2019: Glucose 107, BUN/Cr 16/0.96. EGFR >60. Na/K 135/3.5. Rest of the CMP normal H/H 16/48. MCV 89.7. Platelets 84  Results for Jeremiah, Curtis (MRN [id number]) as of 12/21/2019 08:13  Ref. Range 11/30/2019 12:26 11/30/2019 14:53  Troponin I (High Sensitivity) Latest Ref Range: <18 ng/L 31 (H) 32 (H)   08/29/2019: Chol 157, TG 46, HDL 54, LDL 93    Review of Systems  Cardiovascular: Positive for chest pain. Negative for dyspnea on exertion, leg swelling, palpitations and syncope.        Vitals:  03/20/21 1045  BP: (!) 145/85  Pulse: 94  SpO2: 96%     Body mass index is 30.45 kg/m. Filed Weights   03/20/21 1045  Weight: 183 lb (83 kg)     Objective:   Physical Exam Vitals and nursing note reviewed.  Constitutional:      Appearance: He is well-developed.  Neck:     Vascular: No JVD.  Cardiovascular:      Rate and Rhythm: Normal rate and regular rhythm.     Pulses: Intact distal pulses.     Heart sounds: Normal heart sounds. No murmur heard.   Pulmonary:     Effort: Pulmonary effort is normal.     Breath sounds: Normal breath sounds. No wheezing or rales.          Assessment & Recommendations:    40 year old African-American man with hypertension, h/o COVID in Dec 2020, elevated calcium score.  Elevated calcium score: Mildly elevated calcium score 22. Continue risk factor modification. Added Crestor 10 mg daily. Repeat lipid panel in 3 months  Hypertension: Added metoprolol succinate 50 mg. May help chest pain as well.  F/u in 3 months.  Jeremiah Mormon, MD [facility] Pager: [phone] Office: [phone]

## 2021-03-21 MED FILL — HYDROCHLOROTHIAZIDE 25 MG T: 25 | 30 days supply | Qty: 30 | Fill #4

## 2021-03-30 ENCOUNTER — Other Ambulatory Visit: Payer: Self-pay

## 2021-04-08 ENCOUNTER — Other Ambulatory Visit: Payer: Self-pay

## 2021-04-08 ENCOUNTER — Ambulatory Visit (INDEPENDENT_AMBULATORY_CARE_PROVIDER_SITE_OTHER): Payer: 59 | Admitting: Physical Medicine and Rehabilitation

## 2021-04-08 ENCOUNTER — Encounter: Payer: Self-pay | Admitting: Physical Medicine and Rehabilitation

## 2021-04-08 ENCOUNTER — Ambulatory Visit: Payer: Self-pay

## 2021-04-08 VITALS — BP 129/85 | HR 78

## 2021-04-08 DIAGNOSIS — M5416 Radiculopathy, lumbar region: Secondary | ICD-10-CM

## 2021-04-08 DIAGNOSIS — M961 Postlaminectomy syndrome, not elsewhere classified: Secondary | ICD-10-CM

## 2021-04-08 DIAGNOSIS — M5116 Intervertebral disc disorders with radiculopathy, lumbar region: Secondary | ICD-10-CM

## 2021-04-08 MED ORDER — METHYLPREDNISOLONE ACETATE 80 MG/ML IJ SUSP: 80.0000 mg | Freq: Once | INTRAMUSCULAR | Status: DC

## 2021-04-08 MED FILL — Amlodipine Besylate Tab 10 MG (Base Equivalent): ORAL | 30 days supply | Qty: 30 | Fill #0 | Status: AC

## 2021-04-08 NOTE — Patient Instructions (Signed)

## 2021-04-08 NOTE — Progress Notes (Signed)
Pt state lower back pain mostly his left side.. Pt state sitting, walking and bending makes the pain worse. Pt state pulling and pushing makes the pain worse. Pt state he take over the counter pain meds to help ease his pain.  Numeric Pain Rating Scale and Functional Assessment Average Pain 6   In the last MONTH (on 0-10 scale) has pain interfered with the following?  1. General activity like being  able to carry out your everyday physical activities such as walking, climbing stairs, carrying groceries, or moving a chair?  Rating(8)   +Driver, -BT, -Dye Allergies.              Marland Kitchen+

## 2021-04-08 NOTE — Procedures (Signed)
Lumbosacral Transforaminal Epidural Steroid Injection - Sub-Pedicular Approach with Fluoroscopic Guidance  Patient: Jeremiah Curtis      Date of Birth: Nov 22, 1981 MRN: [id number] PCP: Claiborne Rigg, NP      Visit Date: 04/08/2021   Universal Protocol:    Date/Time: 04/08/2021  Consent Given By: the patient  Position: PRONE  Additional Comments: Vital signs were monitored before and after the procedure. Patient was prepped and draped in the usual sterile fashion. The correct patient, procedure, and site was verified.   Injection Procedure Details:   Procedure diagnoses: Lumbar radiculopathy [M54.16]    Meds Administered:  Meds ordered this encounter  Medications  . methylPREDNISolone acetate (DEPO-MEDROL) injection 80 mg    Laterality: Left  Location/Site:  L5-S1 S1-2  Needle:5.0 in., 22 ga.  Short bevel or Quincke spinal needle AND 3.5 in. 22 ga. Short bevel or Quincke spinal needle  Needle Placement: Transforaminal  Findings:    -Comments: Excellent flow of contrast along the nerve, nerve root and into the epidural space.  Procedure Details: After squaring off the end-plates to get a true AP view, the C-arm was positioned so that an oblique view of the foramen as noted above was visualized. The target area is just inferior to the "nose of the scotty dog" or sub pedicular. The soft tissues overlying this structure were infiltrated with 2-3 ml. of 1% Lidocaine without Epinephrine.  The spinal needle was inserted toward the target using a "trajectory" view along the fluoroscope beam.  Under AP and lateral visualization, the needle was advanced so it did not puncture dura and was located close the 6 O'Clock position of the pedical in AP tracterory. Biplanar projections were used to confirm position. Aspiration was confirmed to be negative for CSF and/or blood. A 1-2 ml. volume of Isovue-250 was injected and flow of contrast was noted at each level. Radiographs were  obtained for documentation purposes.   After attaining the desired flow of contrast documented above, a 0.5 to 1.0 ml test dose of 0.25% Marcaine was injected into each respective transforaminal space.  The patient was observed for 90 seconds post injection.  After no sensory deficits were reported, and normal lower extremity motor function was noted,   the above injectate was administered so that equal amounts of the injectate were placed at each foramen (level) into the transforaminal epidural space.   Additional Comments:  The patient tolerated the procedure well Dressing: 2 x 2 sterile gauze and Band-Aid    Post-procedure details: Patient was observed during the procedure. Post-procedure instructions were reviewed.  Patient left the clinic in stable condition.

## 2021-04-08 NOTE — Progress Notes (Signed)
Jeremiah Curtis - 40 y.o. male MRN [id number]  Date of birth: 01-20-1981  Office Visit Note: Visit Date: 04/08/2021 PCP: Claiborne Rigg, NP Referred by: Claiborne Rigg, NP  Subjective: Chief Complaint  Patient presents with  . Lower Back - Pain   HPI:  Jeremiah Curtis is a 40 y.o. male who comes in today at the request of Dr. Annell Greening for planned Left L5-S1 and S1-2 Lumbar epidural steroid injection with fluoroscopic guidance.  The patient has failed conservative care including home exercise, medications, time and activity modification.  This injection will be diagnostic and hopefully therapeutic.  Please see requesting physician notes for further details and justification. MRI reviewed with images and spine model.  MRI reviewed in the note below.   ROS Otherwise per HPI.  Assessment & Plan: Visit Diagnoses:    ICD-10-CM   1. Lumbar radiculopathy  M54.16 XR C-ARM NO REPORT    Epidural Steroid injection    methylPREDNISolone acetate (DEPO-MEDROL) injection 80 mg  2. Radiculopathy due to lumbar intervertebral disc disorder  M51.16 XR C-ARM NO REPORT    Epidural Steroid injection    methylPREDNISolone acetate (DEPO-MEDROL) injection 80 mg  3. 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   Meds & Orders:  Meds ordered this encounter  Medications  . methylPREDNISolone acetate (DEPO-MEDROL) injection 80 mg    Orders Placed This Encounter  Procedures  . XR C-ARM NO REPORT  . Epidural Steroid injection    Follow-up: Return if symptoms worsen or fail to improve.   Procedures: No procedures performed  Lumbosacral Transforaminal Epidural Steroid Injection - Sub-Pedicular Approach with Fluoroscopic Guidance  Patient: Jeremiah Curtis      Date of Birth: [birth year] MRN: [id number] PCP: Claiborne Rigg, NP      Visit Date: 04/08/2021    Universal Protocol:    Date/Time: 04/08/2021  Consent Given By: the patient  Position: PRONE  Additional Comments: Vital signs were monitored before and after the procedure. Patient was prepped and draped in the usual sterile fashion. The correct patient, procedure, and site was verified.   Injection Procedure Details:   Procedure diagnoses: Lumbar radiculopathy [M54.16]    Meds Administered:  Meds ordered this encounter  Medications  . methylPREDNISolone acetate (DEPO-MEDROL) injection 80 mg    Laterality: Left  Location/Site:  L5-S1 S1-2  Needle:5.0 in., 22 ga.  Short bevel or Quincke spinal needle AND 3.5 in. 22 ga. Short bevel or Quincke spinal needle  Needle Placement: Transforaminal  Findings:    -Comments: Excellent flow of contrast along the nerve, nerve root and into the epidural space.  Procedure Details: After squaring off the end-plates to get a true AP view, the C-arm was positioned so that an oblique view of the foramen as noted above was visualized. The target area is just inferior to the "nose of the scotty dog" or sub pedicular. The soft tissues overlying this structure were infiltrated with 2-3 ml. of 1% Lidocaine without Epinephrine.  The spinal needle was inserted toward the target using a "trajectory" view along the fluoroscope beam.  Under AP and lateral visualization, the needle was advanced so it did not puncture dura and was located close the 6 O'Clock position of the pedical in AP tracterory. Biplanar projections were used to confirm position. Aspiration was confirmed to be negative for CSF and/or blood. A 1-2  ml. volume of Isovue-250 was injected and flow of contrast was noted at each level. Radiographs were obtained for documentation purposes.   After attaining the desired flow of contrast documented above, a 0.5 to 1.0 ml test dose of 0.25% Marcaine was injected into each respective transforaminal space.  The patient was observed for 90 seconds  post injection.  After no sensory deficits were reported, and normal lower extremity motor function was noted,   the above injectate was administered so that equal amounts of the injectate were placed at each foramen (level) into the transforaminal epidural space.   Additional Comments:  The patient tolerated the procedure well Dressing: 2 x 2 sterile gauze and Band-Aid    Post-procedure details: Patient was observed during the procedure. Post-procedure instructions were reviewed.  Patient left the clinic in stable condition.      Clinical History: MRI LUMBAR SPINE WITHOUT AND WITH CONTRAST  TECHNIQUE: Multiplanar and multiecho pulse sequences of the lumbar spine were obtained without and with intravenous contrast.  CONTRAST:  82mL MULTIHANCE GADOBENATE DIMEGLUMINE 529 MG/ML IV SOLN  COMPARISON:  Lumbar MRI 02/24/2013  FINDINGS: Segmentation:  Normal  Alignment:  Normal  Vertebrae: Negative for fracture or mass. Mild bone marrow edema in the endplates at L5-S1 related to disc degeneration.  Conus medullaris and cauda equina: Conus extends to the L2 level. Conus and cauda equina appear normal.  Paraspinal and other soft tissues: Negative for paraspinous mass, adenopathy, fluid collection.  Disc levels:  L1-2: Negative  L2-3: Negative  L3-4: Negative  L4-5: Left laminotomy unchanged. Recurrent left-sided disc protrusion and associated spurring, unchanged from 2014. There is left lateral recess stenosis and impingement left L5 nerve root similar to the prior study. Spinal canal adequate in size.  L5-S1: Moderately large left disc protrusion with left S1 nerve root impingement. Findings similar to the prior study. Mild to moderate foraminal stenosis bilaterally due to endplate spurring and mild facet degeneration.  IMPRESSION: 1. Left laminotomy L4-5. Recurrent left-sided disc protrusion with left L5 nerve root impingement, similar to 2014  MRI 2. Moderately large left disc protrusion L5-S1 with left S1 nerve root impingement. No change from 2014 MRI.   Electronically Signed   By: Marlan Palau M.D.   On: 02/22/2021 08:43     Objective:  VS:  HT:    WT:   BMI:     BP:129/85  HR:78bpm  TEMP: ( )  RESP:  Physical Exam Vitals and nursing note reviewed.  Constitutional:      General: He is not in acute distress.    Appearance: Normal appearance. He is not ill-appearing.  HENT:     Head: Normocephalic and atraumatic.     Right Ear: External ear normal.     Left Ear: External ear normal.     Nose: No congestion.  Eyes:     Extraocular Movements: Extraocular movements intact.  Cardiovascular:     Rate and Rhythm: Normal rate.     Pulses: Normal pulses.  Pulmonary:     Effort: Pulmonary effort is normal. No respiratory distress.  Abdominal:     General: There is no distension.     Palpations: Abdomen is soft.  Musculoskeletal:        General: No tenderness or signs of injury.     Cervical back: Neck supple.     Right lower leg: No edema.     Left lower leg: No edema.     Comments: Patient has good distal strength without clonus.  Skin:    Findings: No erythema or rash.  Neurological:     General: No focal deficit present.     Mental Status: He is alert and oriented to person, place, and time.     Sensory: No sensory deficit.     Motor: No weakness or abnormal muscle tone.     Coordination: Coordination normal.  Psychiatric:        Mood and Affect: Mood normal.        Behavior: Behavior normal.      Imaging: No results found.

## 2021-04-09 ENCOUNTER — Other Ambulatory Visit: Payer: Self-pay

## 2021-04-17 ENCOUNTER — Other Ambulatory Visit: Payer: Self-pay

## 2021-04-17 ENCOUNTER — Encounter: Payer: Self-pay | Admitting: Orthopaedic Surgery

## 2021-04-17 ENCOUNTER — Ambulatory Visit (INDEPENDENT_AMBULATORY_CARE_PROVIDER_SITE_OTHER): Payer: Self-pay | Admitting: Orthopaedic Surgery

## 2021-04-17 VITALS — BP 141/84 | HR 62 | Ht 65.0 in | Wt 183.0 lb

## 2021-04-17 DIAGNOSIS — G8929 Other chronic pain: Secondary | ICD-10-CM

## 2021-04-17 DIAGNOSIS — M5442 Lumbago with sciatica, left side: Secondary | ICD-10-CM

## 2021-04-17 NOTE — Progress Notes (Signed)
Office Visit Note   Patient: Jeremiah Curtis           Date of Birth: [birth year]           MRN: [id number] Visit Date: 04/17/2021              Requested by: Claiborne Rigg, NP [address] [place],  [place] [address] PCP: Claiborne Rigg, NP   Assessment & Plan: Visit Diagnoses:  1. Chronic left-sided low back pain with left-sided sciatica     Plan: We reviewed his MRI again today.  He has similar findings to his MRI 2014.  Some disc protrusion moderately large at L5-S1 on the left unchanged from 2014.  He has not responded to epidural injection.  I recommend he look for a job we does not have to do a lot of bending or repetitive twisting.  Disc above the L4-5 level look normal.  He can follow-up as needed.  Follow-Up Instructions: Return if symptoms worsen or fail to improve.   Orders:  No orders of the defined types were placed in this encounter.  No orders of the defined types were placed in this encounter.     Procedures: No procedures performed   Clinical Data: No additional findings.   Subjective: Chief Complaint  Patient presents with  . Lower Back - Pain, Follow-up    HPI 40 year old male returns with ongoing problems with back pain.  Had injection with Dr. Alvester Morin and states it did not help at all.  He had pain in his left lumbosacral junction he states it now moved to his right.  He is amatory with a cane and is here with his fiance.  Previous back surgery Dr. Danielle Dess 2012 Worker's Comp. related on the left at L4-5 released with an 8% impairment rating.  Patient has not worked more than short periods of time and states she is always had problems doing work activity due to back pain.  Review of Systems updated unchanged from 01/30/2021 office visit.     Objective: Vital Signs: BP (!) 141/84   Pulse 62   Ht 5\' 5"  (1.651 m)   Wt 183 lb (83 kg)   BMI 30.45 kg/m   Physical Exam Constitutional:      Appearance: He is well-developed.  HENT:      Head: Normocephalic and atraumatic.  Eyes:     Pupils: Pupils are equal, round, and reactive to light.  Neck:     Thyroid: No thyromegaly.     Trachea: No tracheal deviation.  Cardiovascular:     Rate and Rhythm: Normal rate.  Pulmonary:     Effort: Pulmonary effort is normal.     Breath sounds: No wheezing.  Abdominal:     General: Bowel sounds are normal.     Palpations: Abdomen is soft.  Skin:    General: Skin is warm and dry.     Capillary Refill: Capillary refill takes less than 2 seconds.  Neurological:     Mental Status: He is alert and oriented to person, place, and time.  Psychiatric:        Behavior: Behavior normal.        Thought Content: Thought content normal.        Judgment: Judgment normal.     Ortho Exam patient has normal gait without limp.  Trace EHL weakness on the left.  Anterior tib testing counter contraction of the gastrocs left but not on right.  He is able to heel  walk.  Specialty Comments:  No specialty comments available.  Imaging: Narrative & Impression  CLINICAL DATA:  Low back pain. Recurrent left leg pain and weakness. History of back surgery.  EXAM: MRI LUMBAR SPINE WITHOUT AND WITH CONTRAST  TECHNIQUE: Multiplanar and multiecho pulse sequences of the lumbar spine were obtained without and with intravenous contrast.  CONTRAST:  57mL MULTIHANCE GADOBENATE DIMEGLUMINE 529 MG/ML IV SOLN  COMPARISON:  Lumbar MRI 02/24/2013  FINDINGS: Segmentation:  Normal  Alignment:  Normal  Vertebrae: Negative for fracture or mass. Mild bone marrow edema in the endplates at L5-S1 related to disc degeneration.  Conus medullaris and cauda equina: Conus extends to the L2 level. Conus and cauda equina appear normal.  Paraspinal and other soft tissues: Negative for paraspinous mass, adenopathy, fluid collection.  Disc levels:  L1-2: Negative  L2-3: Negative  L3-4: Negative  L4-5: Left laminotomy unchanged. Recurrent  left-sided disc protrusion and associated spurring, unchanged from 2014. There is left lateral recess stenosis and impingement left L5 nerve root similar to the prior study. Spinal canal adequate in size.  L5-S1: Moderately large left disc protrusion with left S1 nerve root impingement. Findings similar to the prior study. Mild to moderate foraminal stenosis bilaterally due to endplate spurring and mild facet degeneration.  IMPRESSION: 1. Left laminotomy L4-5. 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 Moderately large left disc protrusion L5-S1 with left S1 nerve root impingement. No change from 2014 MRI.   Electronically Signed   By: Marlan Palau M.D.   On: 02/22/2021 08:43      PMFS History: Patient Active Problem List   Diagnosis Date Noted  . Mixed hyperlipidemia 03/20/2021  . 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 Elevated coronary artery calcium score 01/20/2020  . Atypical chest pain 12/21/2019  . Family history of early CAD 12/21/2019  . Essential hypertension 12/21/2019   Past Medical History:  Diagnosis Date  . Back symptoms, other   . Hypertension   . Idiopathic thrombocytopenic purpura (ITP) (HCC)     Family History  Problem Relation Age of Onset  . Hypertension Mother   . Hypertension Father   . Diabetes Maternal Grandmother   . Diabetes Paternal Grandmother     Past Surgical History:  Procedure Laterality Date  . BACK SURGERY    . LEG SURGERY     Social History   Occupational History  . Not on file  Tobacco Use  . Smoking status: Former Smoker    Packs/day: 0.50    Years: 16.00    Pack years: 8.00    Types: Cigarettes    Quit date: 2021    Years since quitting: 1.2  . Smokeless tobacco: Never Used  Vaping Use  . Vaping Use: Never used  Substance and Sexual Activity  . Alcohol use: Yes    Comment: occasionally  . Drug use: No  . Sexual  activity: Yes

## 2021-04-19 ENCOUNTER — Other Ambulatory Visit: Payer: Self-pay

## 2021-04-19 MED FILL — Hydrochlorothiazide Tab 25 MG: ORAL | 30 days supply | Qty: 30 | Fill #0 | Status: AC

## 2021-04-19 MED FILL — Rosuvastatin Calcium Tab 10 MG: ORAL | 30 days supply | Qty: 30 | Fill #0 | Status: AC

## 2021-04-19 MED FILL — Metoprolol Succinate Tab ER 24HR 50 MG (Tartrate Equiv): ORAL | 30 days supply | Qty: 30 | Fill #0 | Status: AC

## 2021-05-01 ENCOUNTER — Other Ambulatory Visit: Payer: Self-pay

## 2021-05-01 ENCOUNTER — Ambulatory Visit: Payer: Self-pay | Attending: Nurse Practitioner | Admitting: Family Medicine

## 2021-05-01 ENCOUNTER — Encounter: Payer: Self-pay | Admitting: Family Medicine

## 2021-05-01 ENCOUNTER — Ambulatory Visit: Payer: 59 | Admitting: Nurse Practitioner

## 2021-05-01 VITALS — BP 126/72 | HR 67 | Ht 65.0 in | Wt 183.0 lb

## 2021-05-01 DIAGNOSIS — Z91013 Allergy to seafood: Secondary | ICD-10-CM

## 2021-05-01 DIAGNOSIS — Z91010 Allergy to peanuts: Secondary | ICD-10-CM

## 2021-05-01 MED ORDER — EPINEPHRINE 0.3 MG/0.3ML IJ SOAJ
0.3000 mg | INTRAMUSCULAR | 2 refills | Status: DC | PRN
Start: 2021-05-01 — End: 2023-06-02
  Filled 2021-05-01: qty 2, 30d supply, fill #0

## 2021-05-01 NOTE — Progress Notes (Signed)
Subjective:  Patient ID: Jeremiah Curtis, male    DOB: 1981/07/26  Age: 40 y.o. MRN: [id number]  CC: Allergic Reaction   HPI Han Vejar is a 40 year old male patient of Bertram Denver, FNP with a history of Hypertension here with the following concerns. He is allergic to shellfish and would like to know what other fish he is allergic to as he would like to be able to eat some fish since he cannot eat beef. Had an allergic reaction to peanut a month ago and prior to this he had been eating peanut all his life. He had lip swelling but no dyspnea and this was treated with Benadryl and an Epipen. Symptoms have since resolved.  Past Medical History:  Diagnosis Date  . Back symptoms, other   . Hypertension   . Idiopathic thrombocytopenic purpura (ITP) (HCC)     Past Surgical History:  Procedure Laterality Date  . BACK SURGERY    . LEG SURGERY      Family History  Problem Relation Age of Onset  . Hypertension Mother   . Hypertension Father   . Diabetes Maternal Grandmother   . Diabetes Paternal Grandmother     Allergies  Allergen Reactions  . Fish Allergy Anaphylaxis  . Lisinopril     Blurred vision    Outpatient Medications Prior to Visit  Medication Sig Dispense Refill  . amLODipine (NORVASC) 10 MG tablet TAKE 1 TABLET (10 MG TOTAL) BY MOUTH DAILY. 90 tablet 1  . cyclobenzaprine (FLEXERIL) 10 MG tablet Take 1 tablet (10 mg total) by mouth 2 (two) times daily as needed for muscle spasms. 20 tablet 0  . diazepam (VALIUM) 5 MG tablet Take as directed prior to procedure. MUST HAVE DRIVER. 3 tablet 0  . hydrochlorothiazide (HYDRODIURIL) 25 MG tablet TAKE 1 TABLET (25 MG TOTAL) BY MOUTH DAILY. 90 tablet 3  . hydrochlorothiazide (HYDRODIURIL) 25 MG tablet TAKE 1 TABLET (25 MG TOTAL) BY MOUTH DAILY. 90 tablet 3  . hydrOXYzine (ATARAX/VISTARIL) 25 MG tablet TAKE 1 TABLET (25 MG TOTAL) BY MOUTH 3 (THREE) TIMES DAILY AS NEEDED. 60 tablet 1  . metoprolol succinate  (TOPROL-XL) 50 MG 24 hr tablet TAKE 1 TABLET (50 MG TOTAL) BY MOUTH DAILY. TAKE WITH OR IMMEDIATELY FOLLOWING A MEAL. 90 tablet 3  . Multiple Vitamin (MULTIVITAMIN WITH MINERALS) TABS tablet Take 1 tablet by mouth daily.    . nitroGLYCERIN (NITROSTAT) 0.4 MG SL tablet PLACE 1 TABLET (0.4 MG TOTAL) UNDER THE TONGUE EVERY 5 (FIVE) MINUTES AS NEEDED FOR CHEST PAIN. 25 tablet 3  . omeprazole (PRILOSEC) 20 MG capsule TAKE 1 CAPSULE (20 MG TOTAL) BY MOUTH 2 (TWO) TIMES DAILY BEFORE A MEAL. 180 capsule 1  . rosuvastatin (CRESTOR) 10 MG tablet TAKE 1 TABLET (10 MG TOTAL) BY MOUTH DAILY. 90 tablet 3  . EPINEPHrine 0.3 mg/0.3 mL IJ SOAJ injection Inject 0.3 mLs (0.3 mg total) into the muscle as needed for anaphylaxis. 1 each 1   Facility-Administered Medications Prior to Visit  Medication Dose Route Frequency Provider Last Rate Last Admin  . methylPREDNISolone acetate (DEPO-MEDROL) injection 80 mg  80 mg Other Once Tyrell Antonio, MD         ROS Review of Systems  Constitutional: Negative for activity change and appetite change.  HENT: Negative for sinus pressure and sore throat.   Eyes: Negative for visual disturbance.  Respiratory: Negative for cough, chest tightness and shortness of breath.   Cardiovascular: Negative for chest pain and leg swelling.  Gastrointestinal: Negative for abdominal distention, abdominal pain, constipation and diarrhea.  Endocrine: Negative.   Genitourinary: Negative for dysuria.  Musculoskeletal: Negative for joint swelling and myalgias.  Skin: Negative for rash.  Allergic/Immunologic: Negative.   Neurological: Negative for weakness, light-headedness and numbness.  Psychiatric/Behavioral: Negative for dysphoric mood and suicidal ideas.    Objective:  BP 126/72   Pulse 67   Ht 5\' 5"  (1.651 m)   Wt 183 lb (83 kg)   SpO2 99%   BMI 30.45 kg/m   BP/Weight 05/01/2021 04/17/2021 04/08/2021  Systolic BP 126 141 129  Diastolic BP 72 84 85  Wt. (Lbs) 183 183 -  BMI  30.45 30.45 -      Physical Exam Constitutional:      Appearance: He is well-developed.  Neck:     Vascular: No JVD.  Cardiovascular:     Rate and Rhythm: Normal rate.     Heart sounds: Normal heart sounds. No murmur heard.   Pulmonary:     Effort: Pulmonary effort is normal.     Breath sounds: Normal breath sounds. No wheezing or rales.  Chest:     Chest wall: No tenderness.  Abdominal:     General: Bowel sounds are normal. There is no distension.     Palpations: Abdomen is soft. There is no mass.     Tenderness: There is no abdominal tenderness.  Musculoskeletal:        General: Normal range of motion.     Right lower leg: No edema.     Left lower leg: No edema.  Neurological:     Mental Status: He is alert and oriented to person, place, and time.  Psychiatric:        Mood and Affect: Mood normal.     CMP Latest Ref Rng & Units 12/20/2020 11/20/2020 09/28/2020  Glucose 65 - 99 mg/dL 84 79 95  BUN 6 - 20 mg/dL 19 19 18   Creatinine 0.76 - 1.27 mg/dL 11/28/2020 6.19  Sodium 134 - 144 mmol/L 141 139 139  Potassium 3.5 - 5.2 mmol/L 4.4 4.3 4.2  Chloride 96 - 106 mmol/L 101 99 99  CO2 20 - 29 mmol/L 24 27 26   Calcium 8.7 - 10.2 mg/dL 9.9 9.6 9.9  Total Protein 6.5 - 8.1 g/dL - - -  Total Bilirubin 0.3 - 1.2 mg/dL - - -  Alkaline Phos 38 - 126 U/L - - -  AST 15 - 41 U/L - - -  ALT 0 - 44 U/L - - -    Lipid Panel     Component Value Date/Time   CHOL 155 12/20/2020 0848   TRIG 76 12/20/2020 0848   HDL 48 12/20/2020 0848   CHOLHDL 3.2 12/20/2020 0848   LDLCALC 92 12/20/2020 0848    CBC    Component Value Date/Time   WBC 8.8 03/22/2020 1259   WBC 7.3 11/30/2019 1225   RBC 5.34 03/22/2020 1259   HGB 16.0 03/22/2020 1259   HGB 16.2 08/29/2019 1017   HCT 48.1 03/22/2020 1259   HCT 46.9 08/29/2019 1017   PLT 102 (L) 03/22/2020 1259   PLT 86 (LL) 08/29/2019 1017   MCV 90.1 03/22/2020 1259   MCV 87 08/29/2019 1017   MCH 30.0 03/22/2020 1259   MCHC 33.3  03/22/2020 1259   RDW 13.3 03/22/2020 1259   RDW 13.2 08/29/2019 1017   LYMPHSABS 2.8 03/22/2020 1259   LYMPHSABS 2.2 08/29/2019 1017   MONOABS 0.4 03/22/2020 1259  EOSABS 0.3 03/22/2020 1259   EOSABS 0.1 08/29/2019 1017   BASOSABS 0.0 03/22/2020 1259   BASOSABS 0.0 08/29/2019 1017    No results found for: HGBA1C  Assessment & Plan:  1. Allergic to shellfish - Allergy Panel 19, [employer] - EPINEPHrine 0.3 mg/0.3 mL IJ SOAJ injection; Inject 0.3 mg into the muscle as needed for anaphylaxis.  Dispense: 2 each; Refill: 2  2. Peanut allergy - Allergy Panel 18, [employer]    Meds ordered this encounter  Medications  . EPINEPHrine 0.3 mg/0.3 mL IJ SOAJ injection    Sig: Inject 0.3 mg into the muscle as needed for anaphylaxis.    Dispense:  2 each    Refill:  2    Follow-up: Return for Follow-up of chronic medical conditions follow-up with PCP.       Hoy Register, MD, FAAFP. [facility] [address], [place] [phone]   05/01/2021, 12:34 PM

## 2021-05-01 NOTE — Patient Instructions (Signed)
Anaphylactic Reaction, Adult An anaphylactic reaction (anaphylaxis) is a sudden, serious allergic reaction. This affects more than one part of your body. It can be life-threatening. If you have an anaphylactic reaction, you need to get medical help right away. What are the causes? This condition is caused by exposure to things that give you an allergic reaction (allergens). Common allergens include:  Foods, such as peanuts, wheat, shellfish, milk, and eggs.  Medicines.  Insect bites or stings.  Blood or parts of blood received for treatment (transfusions).  Chemicals, such as latex and dyes that are used in food and in medical tests. What are the signs or symptoms? Signs of an anaphylactic reaction may include:  Feeling warm in the face (flushed). Your face may turn red.  Itchy, red, swollen areas of skin (hives).  Swelling of the: ? Eyes. ? Lips. ? Face. ? Mouth. ? Tongue. ? Throat.  Trouble with any of these: ? Breathing. ? Talking. ? Swallowing.  Loud breathing (wheezing).  Feeling dizzy or light-headed.  Passing out (fainting).  Pain or cramps in your belly.  Throwing up (vomiting).  Watery poop (diarrhea). How is this diagnosed? This condition is diagnosed based on:  Your symptoms.  A physical exam.  Blood tests.  Recent exposure to things that give you an allergic reaction. How is this treated? If you think you are having an anaphylactic reaction, you should do this right away:  Give yourself a shot of medicine (epinephrine) using an auto-injector "pen." Your doctor will teach you how to use this pen.  Call for emergency help. If you use a pen, you must still get treated in the hospital. There, you may be given: ? Medicines. ? Oxygen. ? Fluids in an IV tube. Follow these instructions at home: Safety  Always keep an auto-injector pen with you. This could save your life. Use it as told by your doctor.  Do not drive after a reaction. Wait until  your doctor says it is safe to drive.  Make sure that you, the people who live with you, and your employer know: ? What you are allergic to, so you can stay away from it. ? How to use your auto-injector pen.  Wear a bracelet or necklace that says you have an allergy, if your doctor tells you to do this.  Learn the signs of a very bad allergic reaction. This way, you can treat it right away.  Work with your doctors to make a plan for what to do if you have a very bad reaction. It is important to be ready. If you use your auto-injector pen:  Get more medicine (epinephrine) for your pen right away. This is important in case you have another reaction.  Get help right away.   To avoid a serious allergic reaction:  Avoid things that gave you a very bad allergic reaction before.  Tell your server about your allergy when you go out to eat. If you are not sure if your meal has food that you are allergic to, ask your server before you eat it. General instructions  Take over-the-counter and prescription medicines only as told by your doctor.  If you have itchy, red, swollen areas of skin or a rash: ? Use an over-the-counter medicine (antihistamine) as told by your doctor. ? Put cold, wet cloths on your skin. ? Take a cool bath or shower. Avoid hot water.  Tell all doctors who care for you that you have an allergy.  Keep all follow-up   visits as told by your doctor. This is important. Get help right away if:  You have signs of an allergic reaction. You may notice them soon after being exposed to things that give you an allergic reaction. Signs may include: ? Warmth in your face. Your face may turn red. ? Itchy, red, swollen areas of skin. ? Swelling of your:  Eyes.  Lips.  Face.  Mouth.  Tongue.  Throat. ? Trouble with any of these:  Breathing.  Talking.  Swallowing. ? Loud breathing (wheezing). ? Feeling dizzy or light-headed. ? Passing out. ? Pain or cramps in your  belly. ? Throwing up. ? Watery poop.  You had to use your auto-injector pen. You must go to the emergency room even if the medicine seems to be working. This is because another allergic reaction may happen within 3 days (rebound anaphylaxis). These symptoms may be an emergency. Do not wait to see if the symptoms will go away. Do this right away:  Use your auto-injector pen as you have been told.  Get medical help. Call your local emergency services (911 in the [place] Do not drive yourself to the hospital. Summary  An anaphylactic reaction (anaphylaxis) is a sudden, serious allergic reaction.  This condition can be life-threatening. If you have a reaction, get medical help right away.  Your doctor will show you how to give yourself a shot (epinephrine injection) with an auto-injector "pen."  Always keep an auto-injector pen with you. It could save your life. Use it as told by your doctor.  If you had to use your auto-injector pen, you must go to the emergency room. Go there even if the medicine seems to be working. This information is not intended to replace advice given to you by your health care provider. Make sure you discuss any questions you have with your health care provider. Document Revised: 05/18/2019 Document Reviewed: 04/08/2018 Elsevier Patient Education  2021 [employer]vier Inc.  

## 2021-05-01 NOTE — Progress Notes (Signed)
Would like to get food allergy testing done.

## 2021-05-07 ENCOUNTER — Other Ambulatory Visit: Payer: Self-pay

## 2021-05-07 LAB — ALLERGY PANEL 19, SEAFOOD GROUP
Allergen Salmon IgE: <0.1 kU/L
Catfish: <0.1 kU/L
Codfish IgE: <0.1 kU/L
F023-IgE Crab: <0.1 kU/L
F080-IgE Lobster: <0.1 kU/L
Shrimp IgE: 2.27 kU/L — AB
Tuna: <0.1 kU/L

## 2021-05-07 LAB — ALLERGY PANEL 18, NUT MIX GROUP
Allergen Coconut IgE: 0.32 kU/L — AB
F020-IgE Almond: 3.45 kU/L — AB
F202-IgE Cashew Nut: <0.1 kU/L
Hazelnut (Filbert) IgE: 37.1 kU/L — AB
Peanut IgE: 9.43 kU/L — AB
Pecan Nut IgE: <0.1 kU/L
Sesame Seed IgE: 0.47 kU/L — AB

## 2021-05-07 MED FILL — Amlodipine Besylate Tab 10 MG (Base Equivalent): ORAL | 30 days supply | Qty: 30 | Fill #1 | Status: AC

## 2021-05-21 ENCOUNTER — Other Ambulatory Visit: Payer: Self-pay

## 2021-05-21 MED FILL — Hydrochlorothiazide Tab 25 MG: ORAL | 30 days supply | Qty: 30 | Fill #1 | Status: AC

## 2021-05-21 MED FILL — Metoprolol Succinate Tab ER 24HR 50 MG (Tartrate Equiv): ORAL | 30 days supply | Qty: 30 | Fill #1 | Status: AC

## 2021-05-21 MED FILL — Rosuvastatin Calcium Tab 10 MG: ORAL | 30 days supply | Qty: 30 | Fill #1 | Status: AC

## 2021-05-27 ENCOUNTER — Ambulatory Visit (INDEPENDENT_AMBULATORY_CARE_PROVIDER_SITE_OTHER): Payer: Self-pay

## 2021-05-27 ENCOUNTER — Other Ambulatory Visit: Payer: Self-pay

## 2021-05-27 ENCOUNTER — Encounter (HOSPITAL_COMMUNITY): Payer: Self-pay | Admitting: Emergency Medicine

## 2021-05-27 ENCOUNTER — Ambulatory Visit (HOSPITAL_COMMUNITY)
Admission: EM | Admit: 2021-05-27 | Discharge: 2021-05-27 | Disposition: A | Discharge status: Home / Self Care | Payer: Self-pay | Attending: Physician Assistant | Admitting: Physician Assistant

## 2021-05-27 DIAGNOSIS — M25531 Pain in right wrist: Secondary | ICD-10-CM

## 2021-05-27 DIAGNOSIS — M25511 Pain in right shoulder: Secondary | ICD-10-CM

## 2021-05-27 MED ORDER — NAPROXEN 375 MG PO TABS: 375.0000 mg | ORAL_TABLET | Freq: Two times a day (BID) | ORAL | 0 refills | Status: DC

## 2021-05-27 MED ORDER — CYCLOBENZAPRINE HCL 10 MG PO TABS: 10.0000 mg | ORAL_TABLET | Freq: Two times a day (BID) | ORAL | 0 refills | Status: DC | PRN
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
## 2021-05-27 NOTE — Discharge Instructions (Signed)
Your x-rays were normal which is excellent news.  I have called in Flexeril to be used up to twice a day.  This will make you sleepy so do not drive or drink alcohol with it.  I have also called in Naprosyn that you can take twice a day with food to help with inflammation and pain.  Please do not take additional NSAIDs with this as it can cause GI bleeding.  NSAIDs include aspirin, ibuprofen/Advil/Motrin, naproxen/Aleve.  Follow-up with orthopedics if symptoms do not improve.  If you have any worsening symptoms please return for reevaluation.

## 2021-06-11 ENCOUNTER — Other Ambulatory Visit: Payer: Self-pay

## 2021-06-11 MED FILL — Amlodipine Besylate Tab 10 MG (Base Equivalent): ORAL | 30 days supply | Qty: 30 | Fill #2 | Status: AC

## 2021-06-15 LAB — LIPID PANEL
Chol/HDL Ratio: 2.3 ratio (ref 0.0–5.0)
Cholesterol, Total: 108 mg/dL (ref 100–199)
HDL: 48 mg/dL (ref >39)
LDL Chol Calc (NIH): 47 mg/dL (ref 0–99)
Triglycerides: 59 mg/dL (ref 0–149)
VLDL Cholesterol Cal: 13 mg/dL (ref 5–40)

## 2021-06-18 ENCOUNTER — Other Ambulatory Visit: Payer: Self-pay

## 2021-06-18 ENCOUNTER — Encounter (HOSPITAL_COMMUNITY): Payer: Self-pay | Admitting: Emergency Medicine

## 2021-06-18 ENCOUNTER — Emergency Department (HOSPITAL_COMMUNITY)
Admission: EM | Admit: 2021-06-18 | Discharge: 2021-06-19 | Disposition: A | Discharge status: Home / Self Care | Payer: Medicaid Other | Attending: Emergency Medicine | Admitting: Emergency Medicine

## 2021-06-18 DIAGNOSIS — R609 Edema, unspecified: Secondary | ICD-10-CM | POA: Insufficient documentation

## 2021-06-18 DIAGNOSIS — Z5321 Procedure and treatment not carried out due to patient leaving prior to being seen by health care provider: Secondary | ICD-10-CM | POA: Insufficient documentation

## 2021-06-18 NOTE — ED Triage Notes (Signed)
Pt arriving with right eye swelling. Pt reports he went to [facility] on Saturday and afterwards noticed burning and swelling. Pt states the swelling has not improved. Denies any vision changes.

## 2021-06-18 NOTE — ED Provider Notes (Signed)
Emergency Medicine Provider Triage Evaluation Note  Jeremiah Curtis , a 40 y.o. male  was evaluated in triage.  Pt complains of left eyelid swelling for past few days. Painful. Worse when he closes his eye. Not a contact lens wearer. No visual disturbance.   Review of Systems  Positive: Eyelid swelling.  Negative: Visual disturbance, fever, chills.   Physical Exam  BP (!) 148/90 (BP Location: Left Arm)   Pulse (!) 59   Temp 98.1 F (36.7 C) (Oral)   Resp 16   Ht 5\' 5"  (1.651 m)   Wt 79.8 kg   SpO2 98%   BMI 29.29 kg/m  Gen:   Awake, no distress   Resp:  Normal effort  MSK:   Moves extremities without difficulty  Other:  PERRL. EOMI. Lower lid suspicious for hordeolum. No periorbital erythema.   Medical Decision Making  Medically screening exam initiated at 10:17 PM.  Appropriate orders placed.  Jeremiah Curtis was informed that the remainder of the evaluation will be completed by another provider, this initial triage assessment does not replace that evaluation, and the importance of remaining in the ED until their evaluation is complete.    Jeremiah Curtis 06/18/21 2221    2222, MD 06/18/21 [phone]

## 2021-06-19 NOTE — ED Notes (Signed)
Called x 3 no answer 

## 2021-06-19 NOTE — ED Notes (Addendum)
Called x 2 no answer

## 2021-06-19 NOTE — ED Notes (Addendum)
Called x 1 no answer

## 2021-06-20 ENCOUNTER — Other Ambulatory Visit: Payer: Self-pay

## 2021-06-20 MED FILL — Hydrochlorothiazide Tab 25 MG: ORAL | 30 days supply | Qty: 30 | Fill #0 | Status: AC

## 2021-06-20 MED FILL — Rosuvastatin Calcium Tab 10 MG: ORAL | 30 days supply | Qty: 30 | Fill #2 | Status: AC

## 2021-06-20 MED FILL — Omeprazole Cap Delayed Release 20 MG: ORAL | 30 days supply | Qty: 60 | Fill #0 | Status: AC

## 2021-06-20 MED FILL — Hydroxyzine HCl Tab 25 MG: ORAL | 20 days supply | Qty: 60 | Fill #0 | Status: AC

## 2021-06-21 ENCOUNTER — Other Ambulatory Visit: Payer: Self-pay

## 2021-06-21 ENCOUNTER — Encounter: Payer: Self-pay | Admitting: Cardiology

## 2021-06-21 ENCOUNTER — Telehealth: Payer: Self-pay | Admitting: Cardiology

## 2021-06-21 VITALS — BP 135/82 | Ht 65.0 in | Wt 176.0 lb

## 2021-06-21 DIAGNOSIS — Z8249 Family history of ischemic heart disease and other diseases of the circulatory system: Secondary | ICD-10-CM

## 2021-06-21 DIAGNOSIS — R931 Abnormal findings on diagnostic imaging of heart and coronary circulation: Secondary | ICD-10-CM

## 2021-06-21 DIAGNOSIS — E782 Mixed hyperlipidemia: Secondary | ICD-10-CM

## 2021-06-21 DIAGNOSIS — I10 Essential (primary) hypertension: Secondary | ICD-10-CM

## 2021-06-21 MED ORDER — ROSUVASTATIN CALCIUM 10 MG PO TABS
ORAL_TABLET | Freq: Every day | ORAL | 3 refills | Status: DC
  Filled 2021-07-25: qty 30, 30d supply, fill #0
  Filled 2021-09-06: qty 30, 30d supply, fill #1
  Filled 2021-10-11: qty 30, 30d supply, fill #2
  Filled 2021-11-12: qty 30, 30d supply, fill #3
  Filled 2021-12-13: qty 30, 30d supply, fill #4
  Filled 2022-01-08: qty 30, 30d supply, fill #5
  Filled 2022-01-08: qty 30, 30d supply, fill #0
  Filled 2022-02-13: qty 30, 30d supply, fill #1
  Filled 2022-03-17: qty 30, 30d supply, fill #2
  Filled 2022-04-18 (×3): qty 30, 30d supply, fill #3
  Filled 2022-05-19: qty 30, 30d supply, fill #4
  Filled 2022-06-20: qty 30, 30d supply, fill #5

## 2021-06-21 MED ORDER — METOPROLOL SUCCINATE ER 50 MG PO TB24
ORAL_TABLET | Freq: Every day | ORAL | 3 refills | Status: DC
  Filled 2021-06-21: qty 30, 30d supply, fill #0
  Filled 2021-07-19: qty 30, 30d supply, fill #1
  Filled 2021-08-19: qty 30, 30d supply, fill #2
  Filled 2021-09-16: qty 30, 30d supply, fill #3
  Filled 2021-10-18: qty 30, 30d supply, fill #4
  Filled 2021-11-18: qty 30, 30d supply, fill #5
  Filled 2021-12-18: qty 30, 30d supply, fill #6
  Filled 2022-01-17: qty 30, 30d supply, fill #0
  Filled 2022-02-13: qty 30, 30d supply, fill #1
  Filled 2022-03-17: qty 30, 30d supply, fill #2
  Filled 2022-04-18: qty 30, 30d supply, fill #3
  Filled 2022-05-19: qty 30, 30d supply, fill #4
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
## 2021-07-01 ENCOUNTER — Other Ambulatory Visit: Payer: Self-pay

## 2021-07-01 ENCOUNTER — Encounter (HOSPITAL_COMMUNITY): Payer: Self-pay

## 2021-07-01 ENCOUNTER — Emergency Department (HOSPITAL_COMMUNITY)
Admission: EM | Admit: 2021-07-01 | Discharge: 2021-07-01 | Disposition: A | Discharge status: Home / Self Care | Payer: Medicaid Other | Attending: Emergency Medicine | Admitting: Emergency Medicine

## 2021-07-01 DIAGNOSIS — M5441 Lumbago with sciatica, right side: Secondary | ICD-10-CM | POA: Insufficient documentation

## 2021-07-01 DIAGNOSIS — Z79899 Other long term (current) drug therapy: Secondary | ICD-10-CM | POA: Insufficient documentation

## 2021-07-01 DIAGNOSIS — I10 Essential (primary) hypertension: Secondary | ICD-10-CM | POA: Insufficient documentation

## 2021-07-01 DIAGNOSIS — X500XXA Overexertion from strenuous movement or load, initial encounter: Secondary | ICD-10-CM | POA: Insufficient documentation

## 2021-07-01 DIAGNOSIS — Z87891 Personal history of nicotine dependence: Secondary | ICD-10-CM | POA: Insufficient documentation

## 2021-07-01 DIAGNOSIS — Y990 Civilian activity done for income or pay: Secondary | ICD-10-CM | POA: Insufficient documentation

## 2021-07-01 MED ORDER — DEXAMETHASONE SODIUM PHOSPHATE 4 MG/ML IJ SOLN
4.0000 mg | Freq: Once | INTRAMUSCULAR | Status: AC
  Administered 2021-07-01: 4 mg via INTRAMUSCULAR
  Filled 2021-07-01: qty 1

## 2021-07-01 MED ORDER — HYDROCODONE-ACETAMINOPHEN 5-325 MG PO TABS: 1.0000 | ORAL_TABLET | Freq: Two times a day (BID) | ORAL | 0 refills | Status: DC | PRN

## 2021-07-01 MED ORDER — KETOROLAC TROMETHAMINE 30 MG/ML IJ SOLN
30.0000 mg | Freq: Once | INTRAMUSCULAR | Status: AC
  Administered 2021-07-01: 30 mg via INTRAMUSCULAR
  Filled 2021-07-01: qty 1

## 2021-07-01 MED ORDER — HYDROCODONE-ACETAMINOPHEN 5-325 MG PO TABS
1.0000 | ORAL_TABLET | Freq: Two times a day (BID) | ORAL | 0 refills | Status: DC | PRN
  Filled 2021-07-01: qty 6, 3d supply, fill #0

## 2021-07-01 NOTE — ED Provider Notes (Signed)
[facility]-EMERGENCY DEPT Provider Note   CSN: [id number] Arrival date & time: 07/01/21  1122     History Chief Complaint  Patient presents with   Back Pain    Jeremiah Curtis is a 40 y.o. 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  Per chart review patient sees orthopedics, Dr. Ophelia Charter.  Patient had recent MRI of his back done which showed some disc protrusion moderately large at L5-S1 unchanged from 2014.  He has not responded to recent epidural injection which he received on 4/11.  Patient states that he primarily came in today because he started having pain on the right side of his back.  Patient states that he normally has pain on the left side.  Patient states on the right side the pain shoots down his leg similar to his left side.  Denies any numbness and tingling on his right side.  Is ambulating normally.  Patient states this started 4 days ago, patient states that he lifts very heavy objects at work. Denies any specific trigger that he can think of.  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  Patient denies any trauma to his back recently.  Denies any weakness or foot drop on the right side.  Denies any numbness or tingling onto his right side.  Patient states he is having chronic pain to his left side which is unchanged.  Has not followed up with Dr. Ophelia Charter since April.  Denies any surgeries, IV drug use, saddle paresthesias, urinary retention, urinary incontinence, bowel incontinence. Is still going to work.  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   Elevated  coronary artery calcium score 01/20/2020   Atypical chest pain 12/21/2019   Family history of early CAD 12/21/2019   Essential hypertension 12/21/2019    Past Surgical History:  Procedure Laterality Date   BACK SURGERY     LEG SURGERY         Family History  Problem Relation Age of Onset   Hypertension Mother    Hypertension Father    Diabetes Maternal Grandmother    Diabetes Paternal Grandmother     Social History   Tobacco Use   Smoking status: Former    Packs/day: 0.50    Years: 16.00    Pack years: 8.00    Types: Cigarettes    Quit date: 2021    Years since quitting: 1.5   Smokeless tobacco: Never  Vaping Use   Vaping Use: Never used  Substance Use Topics   Alcohol use: Yes    Comment: occasionally   Drug use: No    Home Medications Prior to Admission medications   Medication Sig Start Date End Date Taking? Authorizing Provider  amLODipine (NORVASC) 10 MG tablet TAKE 1 TABLET (10 MG TOTAL) BY MOUTH DAILY. 02/01/21 02/01/22  Claiborne Rigg, NP  cyclobenzaprine (FLEXERIL) 10 MG tablet Take 1 tablet (10 mg total) by mouth 2 (two) times daily as needed for muscle spasms. 05/27/21   Raspet, Erin K, PA-C  diazepam (VALIUM) 5 MG tablet Take as directed prior to procedure. MUST HAVE  DRIVER. 01/03/09   Eldred Manges, MD  EPINEPHrine 0.3 mg/0.3 mL IJ SOAJ injection Inject 0.3 mg into the muscle as needed for anaphylaxis. 05/01/21   Hoy Register, MD  hydrochlorothiazide (HYDRODIURIL) 25 MG tablet TAKE 1 TABLET (25 MG TOTAL) BY MOUTH DAILY. 02/01/21 02/01/22  Claiborne Rigg, NP  HYDROcodone-acetaminophen (NORCO/VICODIN) 5-325 MG tablet Take 1 tablet by mouth 2 (two) times daily as needed for up to 3 days for severe pain. 07/01/21 07/04/21  Farrel Gordon, PA-C  hydrOXYzine (ATARAX/VISTARIL) 25 MG tablet TAKE 1 TABLET (25 MG TOTAL) BY MOUTH 3 (THREE) TIMES DAILY AS NEEDED. 02/01/21 02/01/22  Claiborne Rigg, NP  metoprolol succinate (TOPROL-XL) 50 MG 24 hr tablet TAKE 1 TABLET (50 MG  TOTAL) BY MOUTH DAILY. TAKE WITH OR IMMEDIATELY FOLLOWING A MEAL. 06/21/21 06/21/22  Patwardhan, Anabel Bene, MD  naproxen (NAPROSYN) 375 MG tablet Take 1 tablet (375 mg total) by mouth 2 (two) times daily. 05/27/21   Raspet, Noberto Retort, PA-C  nitroGLYCERIN (NITROSTAT) 0.4 MG SL tablet PLACE 1 TABLET (0.4 MG TOTAL) UNDER THE TONGUE EVERY 5 (FIVE) MINUTES AS NEEDED FOR CHEST PAIN. 03/20/21 03/20/22  Patwardhan, Anabel Bene, MD  omeprazole (PRILOSEC) 20 MG capsule TAKE 1 CAPSULE (20 MG TOTAL) BY MOUTH 2 (TWO) TIMES DAILY BEFORE A MEAL. 02/01/21 02/01/22  Claiborne Rigg, NP  rosuvastatin (CRESTOR) 10 MG tablet TAKE 1 TABLET (10 MG TOTAL) BY MOUTH DAILY. 06/21/21 06/21/22  Elder Negus, MD    Allergies    Fish allergy, Shellfish allergy, and Lisinopril  Review of Systems   Review of Systems  Constitutional:  Negative for diaphoresis, fatigue and fever.  Eyes:  Negative for visual disturbance.  Respiratory:  Negative for shortness of breath.   Cardiovascular:  Negative for chest pain.  Gastrointestinal:  Negative for nausea and vomiting.  Genitourinary:  Negative for flank pain, frequency, genital sores, hematuria, penile discharge, penile pain, penile swelling and scrotal swelling.  Musculoskeletal:  Positive for back pain. Negative for myalgias.  Skin:  Negative for color change, pallor, rash and wound.  Neurological:  Negative for syncope, weakness, light-headedness, numbness and headaches.  Psychiatric/Behavioral:  Negative for behavioral problems and confusion.    Physical Exam Updated Vital Signs BP (!) 147/89 (BP Location: Left Arm)   Pulse 64   Temp 98 F (36.7 C) (Oral)   Resp 18   Ht 5\' 5"  (1.651 m)   Wt 80.7 kg   SpO2 100%   BMI 29.62 kg/m   Physical Exam Constitutional:      General: He is not in acute distress.    Appearance: Normal appearance. He is not ill-appearing, toxic-appearing or diaphoretic.  HENT:     Mouth/Throat:     Mouth: Mucous membranes are moist.     Pharynx:  Oropharynx is clear.  Eyes:     General: No scleral icterus.    Extraocular Movements: Extraocular movements intact.     Pupils: Pupils are equal, round, and reactive to light.  Cardiovascular:     Rate and Rhythm: Normal rate and regular rhythm.     Pulses: Normal pulses.     Heart sounds: Normal heart sounds.  Pulmonary:     Effort: Pulmonary effort is normal. No respiratory distress.     Breath sounds: Normal breath sounds. No stridor. No wheezing, rhonchi or rales.  Chest:     Chest wall: No tenderness.  Abdominal:     General: Abdomen is flat. There is no distension.  Palpations: Abdomen is soft.     Tenderness: There is no abdominal tenderness. There is no guarding or rebound.  Musculoskeletal:        General: No swelling or tenderness. Normal range of motion.     Cervical back: Normal range of motion and neck supple. No rigidity.     Right lower leg: No edema.     Left lower leg: No edema.  Skin:    General: Skin is warm and dry.     Capillary Refill: Capillary refill takes less than 2 seconds.     Coloration: Skin is not pale.  Neurological:     General: No focal deficit present.     Mental Status: He is alert and oriented to person, place, and time.     Comments: Alert. Clear speech. No facial droop. CNIII-XII grossly intact. Bilateral upper and lower extremities' sensation grossly intact. 5/5 symmetric strength with grip strength and with plantar and dorsi flexion bilaterally.  No foot drop.  Normal sensation to bilateral lower extremities with normal range of motion to knee, ankle and toes.  DP pulse 2+ bilaterally.  Patellar DTRs are 2+ and symmetric . Normal finger to nose bilaterally. Negative pronator drift.  Gait is steady and intact.     Psychiatric:        Mood and Affect: Mood normal.        Behavior: Behavior normal.    ED Results / Procedures / Treatments   Labs (all labs ordered are listed, but only abnormal results are displayed) Labs Reviewed - No  data to display  EKG None  Radiology No results found.  Procedures Procedures   Medications Ordered in ED Medications  ketorolac (TORADOL) 30 MG/ML injection 30 mg (30 mg Intramuscular Given 07/01/21 1232)  dexamethasone (DECADRON) injection 4 mg (4 mg Intramuscular Given 07/01/21 1232)    ED Course  I have reviewed the triage vital signs and the nursing notes.  Pertinent labs & imaging results that were available during my care of the patient were reviewed by me and considered in my medical decision making (see chart for details).    MDM Rules/Calculators/A&P                          Patient presents with complaint of back pain.  Patient is nontoxic appearing, vitals are WNL Patient has normal neurologic exam, no point/focal midline tenderness to palpation. Ambulatory in the ED.  No back pain red flags(Denies numbness, tingling, weakness, saddle anesthesia, incontinence to bowel/bladder, fever, chills, IV drug use, dysuria, or hx of cancer. Patient has not had prior back surgeries). No urinary sxs.  Patient most likely has bulging disc or lumbar strain due to heavy lifting.  Do not think that imaging is indicated at this time, spoke to attending Dr. Anitra LauthPlunkett who agrees. patient states that left side is chronic and there is no difference is on that side, however right side is new.  Patient has a normal neurological exam with good sensation and strength to right lower extremity.  Is able to ambulate.  Did treat with Decadron and Toradol, upon reevaluation patient states that pain is mostly resolved.  Patient states that he has been taking muscle relaxants and naproxen, will prescribe short dose of Norco until patient can see his orthopedic doctor.  Patient agreeable with plan, symptomatic treatment discussed, strict return precautions given.  . I discussed treatment plan, need for PCP follow-up, and return precautions with the patient.  Provided opportunity for questions, patient confirmed  understanding and is in agreement with plan.    Doubt need for further emergent work up at this time. I explained the diagnosis and have given explicit precautions to return to the ER including for any other new or worsening symptoms. The patient understands and accepts the medical plan as it's been dictated and I have answered their questions. Discharge instructions concerning home care and prescriptions have been given. The patient is STABLE and is discharged to home in good condition.  Final Clinical Impression(s) / ED Diagnoses Final diagnoses:  Acute right-sided low back pain with right-sided sciatica    Rx / DC Orders ED Discharge Orders          Ordered    HYDROcodone-acetaminophen (NORCO/VICODIN) 5-325 MG tablet  2 times daily PRN,   Status:  Discontinued        07/01/21 1304    HYDROcodone-acetaminophen (NORCO/VICODIN) 5-325 MG tablet  2 times daily PRN,   Status:  Discontinued        07/01/21 1304    HYDROcodone-acetaminophen (NORCO/VICODIN) 5-325 MG tablet  2 times daily PRN        07/01/21 1305             Farrel Gordon, PA-C 07/01/21 1311    Lorre Nick, MD 07/03/21 1550
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
## 2021-07-01 NOTE — Discharge Instructions (Addendum)
[employer]   You were evaluated in the Emergency Department and after careful evaluation, we did not find any emergent condition requiring admission or further testing in the hospital.   Your exam/testing today was overall reassuring.  Symptoms seem to be due to heavy lifting.  Please try to avoid heavy lifting over the next 2 weeks.  Take naproxen and muscle relaxants as we discussed.  I prescribed you a couple doses of Norco when the pain is severe, do know that there is no take more than 4 g of Tylenol a day.  Do note this is a narcotic, do not drink or operate heavy machinery as we discussed when taking this medication.  I want you to follow-up with Dr. Ophelia Charter, as we discussed you most likely have a bulging disc from your heavy lifting.  You will most likely need another outpatient MRI for your new symptoms on your right.  If you have any symptoms of peeing or pooping on yourself, fevers or other new concerning symptoms please come back to the ER. Please return to the Emergency Department if you experience any worsening of your condition.  Thank you for allowing [place] to be a part of your care. Please speak to your pharmacist about any new medications prescribed today in regards to side effects or interactions with other medications.

## 2021-07-02 ENCOUNTER — Other Ambulatory Visit: Payer: Self-pay

## 2021-07-11 ENCOUNTER — Other Ambulatory Visit: Payer: Self-pay

## 2021-07-11 MED FILL — Amlodipine Besylate Tab 10 MG (Base Equivalent): ORAL | 30 days supply | Qty: 30 | Fill #3 | Status: AC

## 2021-07-19 ENCOUNTER — Other Ambulatory Visit: Payer: Self-pay

## 2021-07-25 ENCOUNTER — Encounter (HOSPITAL_COMMUNITY): Payer: Self-pay

## 2021-07-25 ENCOUNTER — Other Ambulatory Visit: Payer: Self-pay

## 2021-07-25 ENCOUNTER — Emergency Department (HOSPITAL_COMMUNITY)
Admission: EM | Admit: 2021-07-25 | Discharge: 2021-07-25 | Disposition: A | Discharge status: Home / Self Care | Payer: Medicaid Other | Attending: Emergency Medicine | Admitting: Emergency Medicine

## 2021-07-25 DIAGNOSIS — Z79899 Other long term (current) drug therapy: Secondary | ICD-10-CM | POA: Insufficient documentation

## 2021-07-25 DIAGNOSIS — H00012 Hordeolum externum right lower eyelid: Secondary | ICD-10-CM | POA: Insufficient documentation

## 2021-07-25 DIAGNOSIS — I10 Essential (primary) hypertension: Secondary | ICD-10-CM | POA: Insufficient documentation

## 2021-07-25 DIAGNOSIS — Z87891 Personal history of nicotine dependence: Secondary | ICD-10-CM | POA: Insufficient documentation

## 2021-07-25 MED FILL — Hydrochlorothiazide Tab 25 MG: ORAL | 30 days supply | Qty: 30 | Fill #1 | Status: AC

## 2021-07-25 NOTE — ED Provider Notes (Signed)
[facility]-EMERGENCY DEPT Provider Note   CSN: [id number] Arrival date & time: 07/25/21  1542     History No chief complaint on file.   Jeremiah Curtis is a 40 y.o. male with past medical history of ITP, hypertension that presents to the emerge department today for right eyelid swelling.  Patient states that a month ago he had similar presentation however in the inside of his eyelid, was seen at a different hospital and given eyedrops for this, states that it has resolved.  States that a couple days ago he started having a new lesion of his right eyelid, however this time on his outside.  Denies any fevers, vision changes, double vision, photophobia, vision loss.  Denies any blurry vision.  Patient states that the area is irritated and slightly itchy.  Has been putting some type of ointment on it.  Does not have any other complaints at this time.  Denies any facial pain or pain anywhere, states that the area is not painful, however is just irritating him.No eye pain.  Denies any foreign body sensation.  Denies any contact or glasses wear.  No eye trauma. 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   Elevated coronary artery calcium score 01/20/2020   Atypical chest pain 12/21/2019   Family history of early CAD 12/21/2019   Essential hypertension 12/21/2019    Past Surgical History:  Procedure Laterality Date   BACK SURGERY     LEG SURGERY         Family History  Problem Relation Age of Onset   Hypertension Mother    Hypertension Father    Diabetes Maternal Grandmother    Diabetes Paternal Grandmother     Social History   Tobacco Use   Smoking status: Former    Packs/day: 0.50    Years: 16.00     Pack years: 8.00    Types: Cigarettes    Quit date: 2021    Years since quitting: 1.5   Smokeless tobacco: Never  Vaping Use   Vaping Use: Never used  Substance Use Topics   Alcohol use: Yes    Comment: occasionally   Drug use: No    Home Medications Prior to Admission medications   Medication Sig Start Date End Date Taking? Authorizing Provider  amLODipine (NORVASC) 10 MG tablet TAKE 1 TABLET (10 MG TOTAL) BY MOUTH DAILY. 02/01/21 02/01/22  Claiborne Rigg, NP  cyclobenzaprine (FLEXERIL) 10 MG tablet Take 1 tablet (10 mg total) by mouth 2 (two) times daily as needed for muscle spasms. 05/27/21   Raspet, Erin K, PA-C  diazepam (VALIUM) 5 MG tablet Take as directed prior to procedure. MUST HAVE DRIVER. 08/31/80   Eldred Manges, MD  EPINEPHrine 0.3 mg/0.3 mL IJ SOAJ injection Inject 0.3 mg into the muscle as needed for anaphylaxis. 05/01/21   Hoy Register, MD  hydrochlorothiazide (HYDRODIURIL) 25 MG tablet TAKE 1 TABLET (25 MG TOTAL) BY MOUTH DAILY. 02/01/21 02/01/22  Claiborne Rigg, NP  hydrOXYzine (ATARAX/VISTARIL) 25 MG tablet TAKE 1 TABLET (25 MG TOTAL) BY MOUTH 3 (THREE) TIMES DAILY AS NEEDED. 02/01/21 02/01/22  Claiborne Rigg, NP  metoprolol succinate (TOPROL-XL) 50 MG 24 hr tablet TAKE 1  TABLET (50 MG TOTAL) BY MOUTH DAILY. TAKE WITH OR IMMEDIATELY FOLLOWING A MEAL. 06/21/21 06/21/22  Patwardhan, Anabel Bene, MD  naproxen (NAPROSYN) 375 MG tablet Take 1 tablet (375 mg total) by mouth 2 (two) times daily. 05/27/21   Raspet, Noberto Retort, PA-C  nitroGLYCERIN (NITROSTAT) 0.4 MG SL tablet PLACE 1 TABLET (0.4 MG TOTAL) UNDER THE TONGUE EVERY 5 (FIVE) MINUTES AS NEEDED FOR CHEST PAIN. 03/20/21 03/20/22  Patwardhan, Anabel Bene, MD  omeprazole (PRILOSEC) 20 MG capsule TAKE 1 CAPSULE (20 MG TOTAL) BY MOUTH 2 (TWO) TIMES DAILY BEFORE A MEAL. 02/01/21 02/01/22  Claiborne Rigg, NP  rosuvastatin (CRESTOR) 10 MG tablet TAKE 1 TABLET (10 MG TOTAL) BY MOUTH DAILY. 06/21/21 06/21/22  Elder Negus, MD    Allergies     Fish allergy, Shellfish allergy, and Lisinopril  Review of Systems   Review of Systems  Constitutional:  Negative for diaphoresis, fatigue and fever.  HENT:  Negative for congestion, drooling, sinus pressure, sneezing and sore throat.   Eyes:  Negative for photophobia, pain, discharge, redness, itching and visual disturbance.       Eyelid lesion  Respiratory:  Negative for shortness of breath.   Cardiovascular:  Negative for chest pain.  Gastrointestinal:  Negative for nausea and vomiting.  Musculoskeletal:  Negative for back pain and myalgias.  Skin:  Negative for color change, pallor, rash and wound.  Neurological:  Negative for syncope, weakness, light-headedness, numbness and headaches.  Psychiatric/Behavioral:  Negative for behavioral problems and confusion.    Physical Exam Updated Vital Signs BP 130/84 (BP Location: Right Arm)   Pulse 64   Temp 99 F (37.2 C) (Oral)   Resp 16   Ht 5\' 5"  (1.651 m)   Wt 80.7 kg   SpO2 100%   BMI 29.62 kg/m   Physical Exam Constitutional:      General: He is not in acute distress.    Appearance: Normal appearance. He is not ill-appearing, toxic-appearing or diaphoretic.  Eyes:     General: Lids are normal. Lids are everted, no foreign bodies appreciated. Vision grossly intact. Gaze aligned appropriately.        Right eye: Hordeolum present. No foreign body or discharge.        Left eye: No foreign body, discharge or hordeolum.     Conjunctiva/sclera:     Right eye: Right conjunctiva is not injected.     Left eye: Left conjunctiva is not injected.      Comments: Patient with 2 mm lesion on right lower eyelid externally.  Does appear as if this is a stye.  Lower eyelid is slightly swollen, however eyelid is not anywhere near shut.  PERRLA, normal EOMs.  No conjunctival injection.  Cardiovascular:     Rate and Rhythm: Normal rate and regular rhythm.     Pulses: Normal pulses.  Pulmonary:     Effort: Pulmonary effort is normal.      Breath sounds: Normal breath sounds.  Musculoskeletal:        General: Normal range of motion.  Skin:    General: Skin is warm and dry.     Capillary Refill: Capillary refill takes less than 2 seconds.  Neurological:     General: No focal deficit present.     Mental Status: He is alert and oriented to person, place, and time.  Psychiatric:        Mood and Affect: Mood normal.        Behavior: Behavior normal.  Thought Content: Thought content normal.  .  Denies  ED Results / Procedures / Treatments   Labs (all labs ordered are listed, but only abnormal results are displayed) Labs Reviewed - No data to display  EKG None  Radiology No results found.  Procedures Procedures   Medications Ordered in ED Medications - No data to display  ED Course  I have reviewed the triage vital signs and the nursing notes.  Pertinent labs & imaging results that were available during my care of the patient were reviewed by me and considered in my medical decision making (see chart for details).    MDM Rules/Calculators/A&P                           Patient presents to the emerge department today for eyelid lesion with some eyelid swelling.  Denies any visual symptoms, patient appears very well.  Vision is grossly intact on my exam.  Appears as if patient has a chalazion versus stye.  We will treat similarly with warm compresses, did discuss that if this does not get better patient will follow-up with ophthalmology.  Patient agreeable.  Doubt need for further emergent work up at this time. I explained the diagnosis and have given explicit precautions to return to the ER including for any other new or worsening symptoms. The patient understands and accepts the medical plan as it's been dictated and I have answered their questions. Discharge instructions concerning home care and prescriptions have been given. The patient is STABLE and is discharged to home in good condition.  Final  Clinical Impression(s) / ED Diagnoses Final diagnoses:  Hordeolum externum of right lower eyelid    Rx / DC Orders ED Discharge Orders     None        Farrel Gordon, PA-C 07/25/21 1702    Pollyann Savoy, MD 07/25/21 2259

## 2021-07-25 NOTE — ED Triage Notes (Signed)
Patient reports that he went to [facility] a month ago and the next day he had right eye swelling. Patient states he has been using OTC eye drops for red eyes and now has a raised are to the lower right lid. Patient denies any pain, but states it is itchy.

## 2021-07-25 NOTE — Discharge Instructions (Signed)
  You were evaluated in the Emergency Department and after careful evaluation, we did not find any emergent condition requiring admission or further testing in the hospital.   Your exam/testing today was overall reassuring.  Symptoms seem to be due to a stye.  Please use the attached instructions to understand fully what this is as we discussed.  I want you to use warm moist compresses over the affected eye at least 10 minutes twice a day to help facilitate drainage.  You can also massage and gently wipe the area to help aid draining.  If you continue to have irritation in that area in the swelling does not go down then you can see an ophthalmologist, use the attached information to schedule follow-up appointment with them.  If you have any vision changes, new or worsening concerning symptom please come back to the ER. Please return to the Emergency Department if you experience any worsening of your condition.  Thank you for allowing [place] to be a part of your care. Please speak to your pharmacist about any new medications prescribed today in regards to side effects or interactions with other medications.

## 2021-08-09 ENCOUNTER — Other Ambulatory Visit: Payer: Self-pay

## 2021-08-09 ENCOUNTER — Other Ambulatory Visit: Payer: Self-pay | Admitting: Nurse Practitioner

## 2021-08-09 DIAGNOSIS — I10 Essential (primary) hypertension: Secondary | ICD-10-CM

## 2021-08-09 MED ORDER — AMLODIPINE BESYLATE 10 MG PO TABS
ORAL_TABLET | Freq: Every day | ORAL | 0 refills | Status: DC
  Filled 2021-08-09: qty 30, 30d supply, fill #0
  Filled 2021-09-11: qty 30, 30d supply, fill #1
  Filled 2021-10-11: qty 30, 30d supply, fill #2

## 2021-08-12 ENCOUNTER — Other Ambulatory Visit: Payer: Self-pay

## 2021-08-19 ENCOUNTER — Other Ambulatory Visit: Payer: Self-pay

## 2021-08-19 MED FILL — Hydrochlorothiazide Tab 25 MG: ORAL | 30 days supply | Qty: 30 | Fill #2 | Status: AC

## 2021-08-27 ENCOUNTER — Ambulatory Visit: Payer: Medicaid Other | Admitting: Nurse Practitioner

## 2021-08-30 ENCOUNTER — Other Ambulatory Visit: Payer: Self-pay

## 2021-08-30 MED FILL — Omeprazole Cap Delayed Release 20 MG: ORAL | 30 days supply | Qty: 60 | Fill #1 | Status: AC

## 2021-09-06 ENCOUNTER — Other Ambulatory Visit: Payer: Self-pay

## 2021-09-11 ENCOUNTER — Other Ambulatory Visit: Payer: Self-pay

## 2021-09-16 ENCOUNTER — Other Ambulatory Visit: Payer: Self-pay

## 2021-09-16 MED FILL — Hydrochlorothiazide Tab 25 MG: ORAL | 30 days supply | Qty: 30 | Fill #3 | Status: AC

## 2021-09-19 ENCOUNTER — Encounter (HOSPITAL_COMMUNITY): Payer: Self-pay

## 2021-09-19 ENCOUNTER — Emergency Department (HOSPITAL_COMMUNITY)
Admission: EM | Admit: 2021-09-19 | Discharge: 2021-09-19 | Disposition: A | Discharge status: Home / Self Care | Payer: Medicaid Other | Attending: Emergency Medicine | Admitting: Emergency Medicine

## 2021-09-19 ENCOUNTER — Other Ambulatory Visit: Payer: Self-pay

## 2021-09-19 DIAGNOSIS — X500XXA Overexertion from strenuous movement or load, initial encounter: Secondary | ICD-10-CM | POA: Insufficient documentation

## 2021-09-19 DIAGNOSIS — R200 Anesthesia of skin: Secondary | ICD-10-CM | POA: Insufficient documentation

## 2021-09-19 DIAGNOSIS — Y900 Blood alcohol level of less than 20 mg/100 ml: Secondary | ICD-10-CM | POA: Insufficient documentation

## 2021-09-19 DIAGNOSIS — I10 Essential (primary) hypertension: Secondary | ICD-10-CM | POA: Insufficient documentation

## 2021-09-19 DIAGNOSIS — Z79899 Other long term (current) drug therapy: Secondary | ICD-10-CM | POA: Insufficient documentation

## 2021-09-19 DIAGNOSIS — M79605 Pain in left leg: Secondary | ICD-10-CM

## 2021-09-19 DIAGNOSIS — M545 Low back pain, unspecified: Secondary | ICD-10-CM | POA: Insufficient documentation

## 2021-09-19 DIAGNOSIS — Z87891 Personal history of nicotine dependence: Secondary | ICD-10-CM | POA: Insufficient documentation

## 2021-09-19 MED ORDER — OXYCODONE HCL 5 MG PO TABS: 5.0000 mg | ORAL_TABLET | ORAL | 0 refills | Status: DC | PRN

## 2021-09-19 MED ORDER — CYCLOBENZAPRINE HCL 10 MG PO TABS: 10.0000 mg | ORAL_TABLET | Freq: Two times a day (BID) | ORAL | 0 refills | Status: DC | PRN

## 2021-09-19 MED ORDER — ACETAMINOPHEN 500 MG PO TABS
1000.0000 mg | ORAL_TABLET | Freq: Once | ORAL | Status: AC
  Administered 2021-09-19: 1000 mg via ORAL
  Filled 2021-09-19: qty 2

## 2021-09-19 MED ORDER — METHYLPREDNISOLONE 4 MG PO TBPK: ORAL_TABLET | ORAL | 0 refills | Status: DC

## 2021-09-19 NOTE — ED Triage Notes (Signed)
Pt complains of back tingling and leg pain for the past couple of days.

## 2021-09-20 NOTE — ED Provider Notes (Signed)
[facility]-EMERGENCY DEPT Provider Note   CSN: [id number] Arrival date & time: 09/19/21  0645     History Chief Complaint  Patient presents with   Back tingling   Leg Pain    Jeremiah Curtis is a 40 y.o. male.  HPI     40yo male with history of prior back surgery with Dr. Danielle Dess, chronic left-sided low back pain with left-sided sciatica, hyperlipidemia, hypertension, ITP, who presents with exacerbation of back pain.  Reports after lifting at work he felt a twinge in his back last week, however has become unbearable within the last few days. Now is severe pain 8/10.  Has chronic left lower extremity numbness and weakness since prior to surgery and walks with a cane.  The pain today is severe, worse with movements.  No new numbness or weakness, no loss of control of bowel or bladder, no history of IV drug use, no trauma or falls.  Denies any abdominal pain, nausea, vomiting, urinary symptoms.  Has been trying Tylenol at home with initially good relief, however has become more severe over the last couple days.  He has been told to avoid ibuprofen due to his history of ITP.  Reports a tingling and numbness across his back, but noted numbness in his legs.  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   Elevated coronary artery calcium score 01/20/2020   Atypical chest pain 12/21/2019   Family history of early CAD 12/21/2019   Essential hypertension 12/21/2019    Past Surgical History:  Procedure Laterality Date   BACK SURGERY     LEG SURGERY         Family History  Problem Relation Age of Onset   Hypertension Mother    Hypertension Father    Diabetes Maternal Grandmother    Diabetes Paternal Grandmother      Social History   Tobacco Use   Smoking status: Former    Packs/day: 0.50    Years: 16.00    Pack years: 8.00    Types: Cigarettes    Quit date: 2021    Years since quitting: 1.7   Smokeless tobacco: Never  Vaping Use   Vaping Use: Never used  Substance Use Topics   Alcohol use: Yes    Comment: occasionally   Drug use: No    Home Medications Prior to Admission medications   Medication Sig Start Date End Date Taking? Authorizing Provider  cyclobenzaprine (FLEXERIL) 10 MG tablet Take 1 tablet (10 mg total) by mouth 2 (two) times daily as needed for muscle spasms. 09/19/21  Yes Alvira Monday, MD  methylPREDNISolone (MEDROL DOSEPAK) 4 MG TBPK tablet See instructions 09/19/21  Yes Alvira Monday, MD  oxyCODONE (ROXICODONE) 5 MG immediate release tablet Take 1 tablet (5 mg total) by mouth every 4 (four) hours as needed for severe pain. 09/19/21  Yes Alvira Monday, MD  amLODipine (NORVASC) 10 MG tablet TAKE 1 TABLET (10 MG TOTAL) BY MOUTH DAILY. 08/09/21 08/09/22  Claiborne Rigg, NP  diazepam (VALIUM) 5 MG tablet Take as directed prior to procedure. MUST HAVE DRIVER. 4/0/98   Eldred Manges, MD  EPINEPHrine 0.3 mg/0.3 mL IJ SOAJ injection Inject 0.3 mg into the muscle as needed for anaphylaxis. 05/01/21  Hoy Register, MD  hydrochlorothiazide (HYDRODIURIL) 25 MG tablet TAKE 1 TABLET (25 MG TOTAL) BY MOUTH DAILY. 02/01/21 02/01/22  Claiborne Rigg, NP  hydrOXYzine (ATARAX/VISTARIL) 25 MG tablet TAKE 1 TABLET (25 MG TOTAL) BY MOUTH 3 (THREE) TIMES DAILY AS NEEDED. 02/01/21 02/01/22  Claiborne Rigg, NP  metoprolol succinate (TOPROL-XL) 50 MG 24 hr tablet TAKE 1 TABLET (50 MG TOTAL) BY MOUTH DAILY. TAKE WITH OR IMMEDIATELY FOLLOWING A MEAL. 06/21/21 06/21/22  Patwardhan, Anabel Bene, MD  naproxen (NAPROSYN) 375 MG tablet Take 1 tablet (375 mg total) by mouth 2 (two) times daily. 05/27/21   Raspet, Noberto Retort, PA-C  nitroGLYCERIN (NITROSTAT) 0.4 MG SL tablet PLACE 1 TABLET (0.4 MG TOTAL) UNDER THE  TONGUE EVERY 5 (FIVE) MINUTES AS NEEDED FOR CHEST PAIN. 03/20/21 03/20/22  Patwardhan, Anabel Bene, MD  omeprazole (PRILOSEC) 20 MG capsule TAKE 1 CAPSULE (20 MG TOTAL) BY MOUTH 2 (TWO) TIMES DAILY BEFORE A MEAL. 02/01/21 02/01/22  Claiborne Rigg, NP  rosuvastatin (CRESTOR) 10 MG tablet TAKE 1 TABLET (10 MG TOTAL) BY MOUTH DAILY. 06/21/21 06/21/22  Patwardhan, Anabel Bene, MD    Allergies    Fish allergy, Shellfish allergy, and Lisinopril  Review of Systems   Review of Systems  Constitutional:  Negative for fever.  Respiratory:  Negative for shortness of breath.   Cardiovascular:  Negative for chest pain.  Gastrointestinal:  Negative for abdominal pain, nausea and vomiting.  Genitourinary:  Negative for difficulty urinating and dysuria.  Musculoskeletal:  Positive for back pain.  Skin:  Negative for rash.  Neurological:  Negative for syncope and headaches. Weakness: chronic LLE. Numbness: across back, chronic in LLE.  Physical Exam Updated Vital Signs BP (!) 145/86   Pulse 61   Temp 98.6 F (37 C) (Oral)   Resp 18   SpO2 99%   Physical Exam Vitals and nursing note reviewed.  Constitutional:      General: He is not in acute distress.    Appearance: Normal appearance. He is not ill-appearing, toxic-appearing or diaphoretic.  HENT:     Head: Normocephalic.  Eyes:     Conjunctiva/sclera: Conjunctivae normal.  Cardiovascular:     Rate and Rhythm: Normal rate and regular rhythm.     Pulses: Normal pulses.  Pulmonary:     Effort: Pulmonary effort is normal. No respiratory distress.  Abdominal:     General: Abdomen is flat. There is no distension.     Tenderness: There is no abdominal tenderness.  Musculoskeletal:        General: Tenderness (low back on left) present. No deformity or signs of injury.     Cervical back: No rigidity.  Skin:    General: Skin is warm and dry.     Coloration: Skin is not jaundiced or pale.  Neurological:     General: No focal deficit present.     Mental  Status: He is alert and oriented to person, place, and time.     Comments: Normal strength/sensation RLE LLE with mild weakness hip flexion, dorsi/plantar flexion of ankle, 0/5 great toe dorsiflexion reports unchanged since prior, numbness chronic     ED Results / Procedures / Treatments   Labs (all labs ordered are listed, but only abnormal results are displayed) Labs Reviewed - No data to display  EKG None  Radiology No results found.  Procedures Procedures   Medications Ordered in ED Medications  acetaminophen (TYLENOL) tablet 1,000 mg (1,000 mg Oral Given 09/19/21 1340)    ED Course  I have reviewed the triage vital signs and the nursing notes.  Pertinent labs & imaging results that were available during my care of the patient were reviewed by me and considered in my medical decision making (see chart for details).    MDM Rules/Calculators/A&P                            40yo male with history of prior back surgery with Dr. Danielle Dess, chronic left-sided low back pain with left-sided sciatica, hyperlipidemia, hypertension, ITP, who presents with exacerbation of back pain.   Patient has an unchanged neurologic exam from baseline and denies any urinary retention or overflow incontinence, stool incontinence, saddle anesthesia, fever, IV drug use, trauma, chronic steroid use or immunocompromise and have low suspicion suspicion for cauda equina, fracture, epidural abscess, or vertebral osteomyelitis. No sign of intraabdominal or urinary source of pain.   Suspect likely exacerbation of pain related to known disc herniation.  Recommend light duty at work, tylenol, given medrol dosepack, flexeril rx, and after review in [place] drug database and discussion of risks gave short rx for oxycodone given severity of pain. Patient discharged in stable condition with understanding of reasons to return.     Final Clinical Impression(s) / ED Diagnoses Final diagnoses:  Low back pain radiating to  left lower extremity    Rx / DC Orders ED Discharge Orders          Ordered    methylPREDNISolone (MEDROL DOSEPAK) 4 MG TBPK tablet        09/19/21 1333    cyclobenzaprine (FLEXERIL) 10 MG tablet  2 times daily PRN        09/19/21 1333    oxyCODONE (ROXICODONE) 5 MG immediate release tablet  Every 4 hours PRN        09/19/21 1333             Alvira Monday, MD 09/20/21 [id number]

## 2021-10-02 ENCOUNTER — Ambulatory Visit: Payer: Medicaid Other | Admitting: Nurse Practitioner

## 2021-10-11 ENCOUNTER — Other Ambulatory Visit: Payer: Self-pay

## 2021-10-18 ENCOUNTER — Other Ambulatory Visit: Payer: Self-pay

## 2021-10-18 MED FILL — Hydrochlorothiazide Tab 25 MG: ORAL | 30 days supply | Qty: 30 | Fill #4 | Status: AC

## 2021-11-08 ENCOUNTER — Telehealth: Payer: Self-pay | Admitting: Orthopaedic Surgery

## 2021-11-08 NOTE — Telephone Encounter (Signed)
Pt called requesting a copy of MRI. Please call pt when ready for pick up. Pt phone number is [phone].

## 2021-11-08 NOTE — Telephone Encounter (Signed)
Pt called back I advised him of message below  

## 2021-11-12 ENCOUNTER — Other Ambulatory Visit: Payer: Self-pay

## 2021-11-12 ENCOUNTER — Other Ambulatory Visit: Payer: Self-pay | Admitting: Nurse Practitioner

## 2021-11-12 DIAGNOSIS — I10 Essential (primary) hypertension: Secondary | ICD-10-CM

## 2021-11-12 MED ORDER — AMLODIPINE BESYLATE 10 MG PO TABS
ORAL_TABLET | Freq: Every day | ORAL | 0 refills | Status: DC
  Filled 2021-11-12: qty 30, 30d supply, fill #0
  Filled 2021-12-13: qty 30, 30d supply, fill #1
  Filled 2022-01-17: qty 30, 30d supply, fill #0

## 2021-11-12 NOTE — Telephone Encounter (Signed)
Patient has appointment tomorrow- #90 granted- courtesy Requested Prescriptions  Pending Prescriptions Disp Refills  . amLODipine (NORVASC) 10 MG tablet 90 tablet 0    Sig: TAKE 1 TABLET (10 MG TOTAL) BY MOUTH DAILY.     Cardiovascular:  Calcium Channel Blockers Failed - 11/12/2021 10:29 AM      Failed - Last BP in normal range    BP Readings from Last 1 Encounters:  09/19/21 (!) 145/86         Failed - Valid encounter within last 6 months    Recent Outpatient Visits          6 months ago Peanut allergy   [facility] [place], Odette Horns, MD   9 months ago Essential hypertension   [facility] [place], Shea Stakes, NP   1 year ago Essential hypertension   [facility] [place], Shea Stakes, NP   1 year ago Essential hypertension   [facility] [place], Shea Stakes, NP   1 year ago Essential hypertension   [facility] [place], Shea Stakes, NP      Future Appointments            Tomorrow Claiborne Rigg, NP [facility]   In 7 months Patwardhan, Anabel Bene, MD [facility]

## 2021-11-13 ENCOUNTER — Other Ambulatory Visit: Payer: Self-pay

## 2021-11-13 ENCOUNTER — Encounter: Payer: Self-pay | Admitting: Nurse Practitioner

## 2021-11-13 ENCOUNTER — Ambulatory Visit: Payer: 59 | Attending: Nurse Practitioner | Admitting: Nurse Practitioner

## 2021-11-13 VITALS — BP 122/83 | HR 91 | Ht 65.0 in | Wt 180.5 lb

## 2021-11-13 DIAGNOSIS — Z862 Personal history of diseases of the blood and blood-forming organs and certain disorders involving the immune mechanism: Secondary | ICD-10-CM | POA: Diagnosis not present

## 2021-11-13 DIAGNOSIS — Z1159 Encounter for screening for other viral diseases: Secondary | ICD-10-CM | POA: Diagnosis not present

## 2021-11-13 DIAGNOSIS — I10 Essential (primary) hypertension: Secondary | ICD-10-CM | POA: Diagnosis not present

## 2021-11-13 DIAGNOSIS — R7309 Other abnormal glucose: Secondary | ICD-10-CM

## 2021-11-13 NOTE — Progress Notes (Signed)
Assessment & Plan:  Jeremiah Curtis was seen today for hypertension.  Diagnoses and all orders for this visit:  Primary hypertension -     CMP14+EGFR  Need for hepatitis C screening test -     HCV Ab w Reflex to Quant PCR  Elevated glucose -     Hemoglobin A1c  History of ITP -     CBC with Differential   Patient has been counseled on age-appropriate routine health concerns for screening and prevention. These are reviewed and up-to-date. Referrals have been placed accordingly. Immunizations are up-to-date or declined.    Subjective:   Chief Complaint  Patient presents with   Hypertension    Jeremiah Curtis 40 y.o. male presents to office today for follow up to HTN He has a past medical history of  chronic Back pain, Hypertension, and Idiopathic thrombocytopenic purpura.  HTN Blood pressure is well controlled with amlodipine 10 mg daily,  HCTZ 25 mg daily, toprol XL 50 mg daily.  BP Readings from Last 3 Encounters:  11/13/21 122/83  09/19/21 (!) 145/86  07/25/21 128/77      Review of Systems  Constitutional:  Negative for fever, malaise/fatigue and weight loss.  HENT: Negative.  Negative for nosebleeds.   Eyes: Negative.  Negative for blurred vision, double vision and photophobia.  Respiratory: Negative.  Negative for cough and shortness of breath.   Cardiovascular: Negative.  Negative for chest pain, palpitations and leg swelling.  Gastrointestinal: Negative.  Negative for heartburn, nausea and vomiting.  Musculoskeletal: Negative.  Negative for myalgias.  Neurological: Negative.  Negative for dizziness, focal weakness, seizures and headaches.  Psychiatric/Behavioral: Negative.  Negative for suicidal ideas.    Past Medical History:  Diagnosis Date   Back symptoms, other    Hypertension    Idiopathic thrombocytopenic purpura (ITP) (HCC)     Past Surgical History:  Procedure Laterality Date   BACK SURGERY     LEG SURGERY      Family History  Problem Relation  Age of Onset   Hypertension Mother    Hypertension Father    Diabetes Maternal Grandmother    Diabetes Paternal Grandmother     Social History Reviewed with no changes to be made today.   Outpatient Medications Prior to Visit  Medication Sig Dispense Refill   amLODipine (NORVASC) 10 MG tablet TAKE 1 TABLET (10 MG TOTAL) BY MOUTH DAILY. 90 tablet 0   cyclobenzaprine (FLEXERIL) 10 MG tablet Take 1 tablet (10 mg total) by mouth 2 (two) times daily as needed for muscle spasms. 20 tablet 0   EPINEPHrine 0.3 mg/0.3 mL IJ SOAJ injection Inject 0.3 mg into the muscle as needed for anaphylaxis. 2 each 2   hydrochlorothiazide (HYDRODIURIL) 25 MG tablet TAKE 1 TABLET (25 MG TOTAL) BY MOUTH DAILY. 90 tablet 3   hydrOXYzine (ATARAX/VISTARIL) 25 MG tablet TAKE 1 TABLET (25 MG TOTAL) BY MOUTH 3 (THREE) TIMES DAILY AS NEEDED. 60 tablet 1   metoprolol succinate (TOPROL-XL) 50 MG 24 hr tablet TAKE 1 TABLET (50 MG TOTAL) BY MOUTH DAILY. TAKE WITH OR IMMEDIATELY FOLLOWING A MEAL. 90 tablet 3   naproxen (NAPROSYN) 375 MG tablet Take 1 tablet (375 mg total) by mouth 2 (two) times daily. 20 tablet 0   nitroGLYCERIN (NITROSTAT) 0.4 MG SL tablet PLACE 1 TABLET (0.4 MG TOTAL) UNDER THE TONGUE EVERY 5 (FIVE) MINUTES AS NEEDED FOR CHEST PAIN. 25 tablet 3   omeprazole (PRILOSEC) 20 MG capsule TAKE 1 CAPSULE (20 MG TOTAL) BY MOUTH 2 (  TWO) TIMES DAILY BEFORE A MEAL. 180 capsule 1   oxyCODONE (ROXICODONE) 5 MG immediate release tablet Take 1 tablet (5 mg total) by mouth every 4 (four) hours as needed for severe pain. 12 tablet 0   rosuvastatin (CRESTOR) 10 MG tablet TAKE 1 TABLET (10 MG TOTAL) BY MOUTH DAILY. 90 tablet 3   diazepam (VALIUM) 5 MG tablet Take as directed prior to procedure. MUST HAVE DRIVER. 3 tablet 0   methylPREDNISolone (MEDROL DOSEPAK) 4 MG TBPK tablet See instructions 21 each 0   No facility-administered medications prior to visit.    Allergies  Allergen Reactions   Fish Allergy Anaphylaxis    Shellfish Allergy Anaphylaxis   Lisinopril     Blurred vision   Peanut (Diagnostic) Hives   Sesame Seed (Diagnostic) Hives and Itching       Objective:    BP 122/83   Pulse 91   Ht $R'5\' 5"'yu$  (1.651 m)   Wt 180 lb 8 oz (81.9 kg)   SpO2 100%   BMI 30.04 kg/m  Wt Readings from Last 3 Encounters:  11/13/21 180 lb 8 oz (81.9 kg)  07/25/21 178 lb (80.7 kg)  07/01/21 178 lb (80.7 kg)    Physical Exam Vitals and nursing note reviewed.  Constitutional:      Appearance: He is well-developed.  HENT:     Head: Normocephalic and atraumatic.  Cardiovascular:     Rate and Rhythm: Normal rate and regular rhythm.     Heart sounds: Normal heart sounds. No murmur heard.   No friction rub. No gallop.  Pulmonary:     Effort: Pulmonary effort is normal. No tachypnea or respiratory distress.     Breath sounds: Normal breath sounds. No decreased breath sounds, wheezing, rhonchi or rales.  Chest:     Chest wall: No tenderness.  Abdominal:     General: Bowel sounds are normal.     Palpations: Abdomen is soft.  Musculoskeletal:        General: Normal range of motion.     Cervical back: Normal range of motion.  Skin:    General: Skin is warm and dry.  Neurological:     Mental Status: He is alert and oriented to person, place, and time.     Coordination: Coordination normal.     Comments: Chronic back pain. Uses straight cane for mobility assistance   Psychiatric:        Behavior: Behavior normal. Behavior is cooperative.        Thought Content: Thought content normal.        Judgment: Judgment normal.         Patient has been counseled extensively about nutrition and exercise as well as the importance of adherence with medications and regular follow-up. The patient was given clear instructions to go to ER or return to medical center if symptoms don't improve, worsen or new problems develop. The patient verbalized understanding.   Follow-up: Return in about 3 months (around 02/13/2022) for  HTN.   [name], FNP-BC [facility] [place], [place]   11/13/2021, 3:52 PM

## 2021-11-14 LAB — CBC WITH DIFFERENTIAL/PLATELET
Basophils Absolute: 0 10*3/uL (ref 0.0–0.2)
Basos: 1 %
EOS (ABSOLUTE): 0.2 10*3/uL (ref 0.0–0.4)
Eos: 3 %
Hematocrit: 44.5 % (ref 37.5–51.0)
Hemoglobin: 15 g/dL (ref 13.0–17.7)
Immature Grans (Abs): 0 10*3/uL (ref 0.0–0.1)
Immature Granulocytes: 0 %
Lymphocytes Absolute: 2.8 10*3/uL (ref 0.7–3.1)
Lymphs: 45 %
MCH: 29.4 pg (ref 26.6–33.0)
MCHC: 33.7 g/dL (ref 31.5–35.7)
MCV: 87 fL (ref 79–97)
Monocytes Absolute: 0.4 10*3/uL (ref 0.1–0.9)
Monocytes: 6 %
Neutrophils Absolute: 2.8 10*3/uL (ref 1.4–7.0)
Neutrophils: 45 %
Platelets: 66 10*3/uL — CL (ref 150–450)
RBC: 5.1 x10E6/uL (ref 4.14–5.80)
RDW: 12.3 % (ref 11.6–15.4)
WBC: 6.3 10*3/uL (ref 3.4–10.8)

## 2021-11-14 LAB — CMP14+EGFR
ALT: 17 IU/L (ref 0–44)
AST: 15 IU/L (ref 0–40)
Albumin/Globulin Ratio: 1.7 (ref 1.2–2.2)
Albumin: 4.7 g/dL (ref 4.0–5.0)
Alkaline Phosphatase: 55 IU/L (ref 44–121)
BUN/Creatinine Ratio: 16 (ref 9–20)
BUN: 17 mg/dL (ref 6–24)
Bilirubin Total: 0.3 mg/dL (ref 0.0–1.2)
CO2: 28 mmol/L (ref 20–29)
Calcium: 9.7 mg/dL (ref 8.7–10.2)
Chloride: 101 mmol/L (ref 96–106)
Creatinine, Ser: 1.07 mg/dL (ref 0.76–1.27)
Globulin, Total: 2.7 g/dL (ref 1.5–4.5)
Glucose: 75 mg/dL (ref 70–99)
Potassium: 4.1 mmol/L (ref 3.5–5.2)
Sodium: 143 mmol/L (ref 134–144)
Total Protein: 7.4 g/dL (ref 6.0–8.5)
eGFR: 90 mL/min/{1.73_m2} (ref >59)

## 2021-11-14 LAB — HCV AB W REFLEX TO QUANT PCR: HCV Ab: 0.1 s/co ratio (ref 0.0–0.9)

## 2021-11-14 LAB — HCV INTERPRETATION

## 2021-11-14 LAB — HEMOGLOBIN A1C
Est. average glucose Bld gHb Est-mCnc: 126 mg/dL
Hgb A1c MFr Bld: 6 % — ABNORMAL HIGH (ref 4.8–5.6)

## 2021-11-15 ENCOUNTER — Telehealth: Payer: Self-pay

## 2021-11-15 NOTE — Telephone Encounter (Signed)
Called patient reviewed all information and repeated back to me. Will call if any questions.  Made a lab appt for 12/29 @ 8:30.

## 2021-11-15 NOTE — Telephone Encounter (Signed)
-----   Message from Claiborne Rigg, NP sent at 11/15/2021  9:18 AM EST ----- Hepatitis C is negative. Kidney, liver function and electrolytes are normal.  A1c shows prediabetes.  Platelets are still low.  However currently at 35.  We will repeat in 4 weeks.  Please make a lab appointment for this.

## 2021-11-18 ENCOUNTER — Other Ambulatory Visit: Payer: Self-pay

## 2021-11-18 MED FILL — Omeprazole Cap Delayed Release 20 MG: ORAL | 30 days supply | Qty: 60 | Fill #2 | Status: AC

## 2021-11-18 MED FILL — Hydrochlorothiazide Tab 25 MG: ORAL | 30 days supply | Qty: 30 | Fill #5 | Status: AC

## 2021-11-19 ENCOUNTER — Other Ambulatory Visit: Payer: Self-pay

## 2021-11-28 DIAGNOSIS — R6889 Other general symptoms and signs: Secondary | ICD-10-CM | POA: Insufficient documentation

## 2021-11-28 DIAGNOSIS — M545 Low back pain, unspecified: Secondary | ICD-10-CM | POA: Insufficient documentation

## 2021-12-05 DIAGNOSIS — M5126 Other intervertebral disc displacement, lumbar region: Secondary | ICD-10-CM | POA: Insufficient documentation

## 2021-12-13 ENCOUNTER — Other Ambulatory Visit: Payer: Self-pay

## 2021-12-18 ENCOUNTER — Other Ambulatory Visit: Payer: Self-pay

## 2021-12-18 MED FILL — Hydrochlorothiazide Tab 25 MG: ORAL | 30 days supply | Qty: 30 | Fill #6 | Status: AC

## 2021-12-26 ENCOUNTER — Other Ambulatory Visit: Payer: Self-pay

## 2021-12-26 ENCOUNTER — Encounter: Payer: Self-pay | Admitting: Nurse Practitioner

## 2021-12-26 ENCOUNTER — Ambulatory Visit: Payer: Medicaid Other | Attending: Nurse Practitioner

## 2021-12-26 DIAGNOSIS — Z862 Personal history of diseases of the blood and blood-forming organs and certain disorders involving the immune mechanism: Secondary | ICD-10-CM

## 2021-12-27 LAB — CBC WITH DIFFERENTIAL/PLATELET
Basophils Absolute: 0 10*3/uL (ref 0.0–0.2)
Basos: 1 %
EOS (ABSOLUTE): 0.1 10*3/uL (ref 0.0–0.4)
Eos: 2 %
Hematocrit: 42.5 % (ref 37.5–51.0)
Hemoglobin: 14.5 g/dL (ref 13.0–17.7)
Immature Grans (Abs): 0 10*3/uL (ref 0.0–0.1)
Immature Granulocytes: 0 %
Lymphocytes Absolute: 2.3 10*3/uL (ref 0.7–3.1)
Lymphs: 43 %
MCH: 30 pg (ref 26.6–33.0)
MCHC: 34.1 g/dL (ref 31.5–35.7)
MCV: 88 fL (ref 79–97)
Monocytes Absolute: 0.3 10*3/uL (ref 0.1–0.9)
Monocytes: 7 %
Neutrophils Absolute: 2.5 10*3/uL (ref 1.4–7.0)
Neutrophils: 47 %
Platelets: 110 10*3/uL — ABNORMAL LOW (ref 150–450)
RBC: 4.84 x10E6/uL (ref 4.14–5.80)
RDW: 12.6 % (ref 11.6–15.4)
WBC: 5.2 10*3/uL (ref 3.4–10.8)

## 2022-01-08 ENCOUNTER — Other Ambulatory Visit: Payer: Self-pay

## 2022-01-08 MED FILL — Hydrochlorothiazide Tab 25 MG: ORAL | 30 days supply | Qty: 30 | Fill #0 | Status: AC

## 2022-01-08 MED FILL — Hydrochlorothiazide Tab 25 MG: ORAL | 30 days supply | Qty: 30 | Fill #7 | Status: CN

## 2022-01-10 ENCOUNTER — Other Ambulatory Visit: Payer: Self-pay

## 2022-01-17 ENCOUNTER — Other Ambulatory Visit: Payer: Self-pay

## 2022-02-13 ENCOUNTER — Other Ambulatory Visit: Payer: Self-pay | Admitting: Nurse Practitioner

## 2022-02-13 ENCOUNTER — Other Ambulatory Visit: Payer: Self-pay

## 2022-02-13 DIAGNOSIS — I10 Essential (primary) hypertension: Secondary | ICD-10-CM

## 2022-02-14 ENCOUNTER — Other Ambulatory Visit: Payer: Self-pay

## 2022-02-14 MED ORDER — HYDROCHLOROTHIAZIDE 25 MG PO TABS
ORAL_TABLET | Freq: Every day | ORAL | 0 refills | Status: DC
  Filled 2022-02-14: qty 30, 30d supply, fill #0

## 2022-02-14 MED ORDER — AMLODIPINE BESYLATE 10 MG PO TABS
ORAL_TABLET | Freq: Every day | ORAL | 0 refills | Status: DC
  Filled 2022-02-14: qty 30, 30d supply, fill #0

## 2022-02-14 NOTE — Telephone Encounter (Signed)
[facility] refill. Requested Prescriptions  Pending Prescriptions Disp Refills   hydrochlorothiazide (HYDRODIURIL) 25 MG tablet 30 tablet 0    Sig: TAKE 1 TABLET (25 MG TOTAL) BY MOUTH DAILY.     Cardiovascular: Diuretics - Thiazide Passed - 02/13/2022  1:51 PM      Passed - Cr in normal range and within 180 days    Creatinine  Date Value Ref Range Status  03/22/2020 1.13 0.61 - 1.24 mg/dL Final   Creatinine, Ser  Date Value Ref Range Status  11/13/2021 1.07 0.76 - 1.27 mg/dL Final         Passed - K in normal range and within 180 days    Potassium  Date Value Ref Range Status  11/13/2021 4.1 3.5 - 5.2 mmol/L Final         Passed - Na in normal range and within 180 days    Sodium  Date Value Ref Range Status  11/13/2021 143 134 - 144 mmol/L Final         Passed - Last BP in normal range    BP Readings from Last 1 Encounters:  11/13/21 122/83         Passed - Valid encounter within last 6 months    Recent Outpatient Visits          3 months ago Primary hypertension   [facility] [place], Shea Stakes, NP   9 months ago Peanut allergy   [facility] Hoy Register, MD   1 year ago Essential hypertension   [facility] [place], Shea Stakes, NP   1 year ago Essential hypertension   [facility] [place], Shea Stakes, NP   1 year ago Essential hypertension   [facility] [place], Shea Stakes, NP      Future Appointments            In 4 months Patwardhan, Anabel Bene, MD [facility]            amLODipine (NORVASC) 10 MG tablet 30 tablet 0    Sig: TAKE 1 TABLET (10 MG TOTAL) BY MOUTH DAILY.     Cardiovascular: Calcium Channel Blockers 2 Passed - 02/13/2022  1:51 PM      Passed - Last BP in normal range    BP Readings from Last 1 Encounters:  11/13/21 122/83         Passed - Last Heart Rate in normal range    Pulse  Readings from Last 1 Encounters:  11/13/21 91         Passed - Valid encounter within last 6 months    Recent Outpatient Visits          3 months ago Primary hypertension   [facility] [place], Shea Stakes, NP   9 months ago Peanut allergy   [facility] Hoy Register, MD   1 year ago Essential hypertension   [place] [facility] Claiborne Rigg, NP   1 year ago Essential hypertension   [facility] [place], Shea Stakes, NP   1 year ago Essential hypertension   [facility] [place], Shea Stakes, NP      Future Appointments            In 4 months Patwardhan, Anabel Bene, MD [facility]

## 2022-03-17 ENCOUNTER — Other Ambulatory Visit: Payer: Self-pay

## 2022-03-17 ENCOUNTER — Other Ambulatory Visit: Payer: Self-pay | Admitting: Nurse Practitioner

## 2022-03-17 DIAGNOSIS — I10 Essential (primary) hypertension: Secondary | ICD-10-CM

## 2022-03-17 DIAGNOSIS — K219 Gastro-esophageal reflux disease without esophagitis: Secondary | ICD-10-CM

## 2022-03-18 ENCOUNTER — Other Ambulatory Visit: Payer: Self-pay

## 2022-03-19 MED ORDER — OMEPRAZOLE 20 MG PO CPDR
DELAYED_RELEASE_CAPSULE | Freq: Two times a day (BID) | ORAL | 1 refills | Status: DC
  Filled 2022-03-19: qty 60, 30d supply, fill #0
  Filled 2022-08-26: qty 60, 30d supply, fill #1

## 2022-03-19 MED ORDER — AMLODIPINE BESYLATE 10 MG PO TABS
ORAL_TABLET | Freq: Every day | ORAL | 1 refills | Status: DC
  Filled 2022-03-19: qty 30, 30d supply, fill #0
  Filled 2022-04-18: qty 30, 30d supply, fill #1

## 2022-03-19 MED ORDER — HYDROCHLOROTHIAZIDE 25 MG PO TABS
ORAL_TABLET | Freq: Every day | ORAL | 1 refills | Status: DC
  Filled 2022-03-19: qty 30, 30d supply, fill #0
  Filled 2022-04-18: qty 30, 30d supply, fill #1

## 2022-03-19 NOTE — Telephone Encounter (Signed)
Requested medications are due for refill today.  Yes - all 3 ? ?Requested medications are on the active medications list.  Yes - all 3 ? ?Last refill. Amlodipine 02/14/2022 #30 0 refills (courtesy refill), HCTZ 02/14/2022 #30 0 refills (Courtesy refill), Omeprazole 02/01/2021 #180 1 refill ? ?Future visit scheduled.   yes ? ?Notes to clinic.  Omeprazole rx expired 02/01/2022. HTN meds were already given a courtesy refill. Please review. ? ? ? ?Requested Prescriptions  ?Pending Prescriptions Disp Refills  ? omeprazole (PRILOSEC) 20 MG capsule 180 capsule 1  ?  Sig: TAKE 1 CAPSULE (20 MG TOTAL) BY MOUTH 2 (TWO) TIMES DAILY BEFORE A MEAL.  ?  ? Gastroenterology: Proton Pump Inhibitors Passed - 03/17/2022  4:18 PM  ?  ?  Passed - Valid encounter within last 12 months  ?  Recent Outpatient Visits   ? ?      ? 4 months ago Primary hypertension  ? [place], [place] W, NP  ? 10 months ago Peanut allergy  ? [place] Charl[place] Rakes, MD  ? 1 year ago Essential hypertension  ? [place], Vernia Buff, NP  ? 1 year ago Essential hypertension  ? [place], Vernia Buff, NP  ? 1 year ago Essential hypertension  ? [place] [name], NP  ? ?  ?  ?Future Appointments   ? ?        ? In 2 months [name], NP [place]  ? In 3 months Patwardhan, Reynold Bowen, MD [facility]  ? ?  ? ?  ?  ?  ? hydrochlorothiazide (HYDRODIURIL) 25 MG tablet 30 tablet 0  ?  Sig: TAKE 1 TABLET (25 MG TOTAL) BY MOUTH DAILY.  ?  ? Cardiovascular: Diuretics - Thiazide Passed - 03/17/2022  4:18 PM  ?  ?  Passed - Cr in normal range and within 180 days  ?  Creatinine  ?Date Value Ref Range Status  ?03/22/2020 1.13 0.61 - 1.24 mg/dL Final  ? ?Creatinine, Ser  ?Date Value Ref Range Status  ?11/13/2021 1.07 0.76 - 1.27 mg/dL Final  ?  ?  ?   ?  Passed - K in normal range and within 180 days  ?  Potassium  ?Date Value Ref Range Status  ?11/13/2021 4.1 3.5 - 5.2 mmol/L Final  ?  ?  ?  ?  Passed - Na in normal range and within 180 days  ?  Sodium  ?Date Value Ref Range Status  ?11/13/2021 143 134 - 144 mmol/L Final  ?  ?  ?  ?  Passed - Last BP in normal range  ?  BP Readings from Last 1 Encounters:  ?11/13/21 122/83  ?  ?  ?  ?  Passed - Valid encounter within last 6 months  ?  Recent Outpatient Visits   ? ?      ? 4 months ago Primary hypertension  ? [place], [place] W, NP  ? 10 months ago Peanut allergy  ? [place] Charlott Rakes, MD  ? 1 year ago Essential hypertension  ? [place], Vernia Buff, NP  ? 1 year ago Essential hypertension  ? [place] [name], NP  ?  1 year ago Essential hypertension  ? [place] [name], NP  ? ?  ?  ?Future Appointments   ? ?        ? In 2 months [name], NP [place]  ? In 3 months Patwardhan, Reynold Bowen, MD [facility]  ? ?  ? ?  ?  ?  ? amLODipine (NORVASC) 10 MG tablet 30 tablet 0  ?  Sig: TAKE 1 TABLET (10 MG TOTAL) BY MOUTH DAILY.  ?  ? Cardiovascular: Calcium Channel Blockers 2 Passed - 03/17/2022  4:18 PM  ?  ?  Passed - Last BP in normal range  ?  BP Readings from Last 1 Encounters:  ?11/13/21 122/83  ?  ?  ?  ?  Passed - Last Heart Rate in normal range  ?  Pulse Readings from Last 1 Encounters:  ?11/13/21 91  ?  ?  ?  ?  Passed - Valid encounter within last 6 months  ?  Recent Outpatient Visits   ? ?      ? 4 months ago Primary hypertension  ? [place], [place] W, NP  ? 10 months ago Peanut allergy  ? [place] Charlott Rakes, MD  ? 1 year ago Essential hypertension  ? [place], Vernia Buff, NP  ? 1 year ago Essential hypertension  ? [place], Vernia Buff, NP  ? 1 year ago Essential hypertension  ? [place] [name], NP  ? ?  ?  ?Future Appointments   ? ?        ? In 2 months [name], NP [place]  ? In 3 months Patwardhan, Reynold Bowen, MD [facility]  ? ?  ? ?  ?  ?  ?  ?

## 2022-03-20 ENCOUNTER — Other Ambulatory Visit: Payer: Self-pay

## 2022-03-25 ENCOUNTER — Other Ambulatory Visit: Payer: Self-pay

## 2022-04-02 ENCOUNTER — Ambulatory Visit (HOSPITAL_COMMUNITY)
Admission: EM | Admit: 2022-04-02 | Discharge: 2022-04-02 | Disposition: A | Discharge status: Home / Self Care | Payer: Medicaid Other

## 2022-04-02 ENCOUNTER — Encounter (HOSPITAL_COMMUNITY): Payer: Self-pay | Admitting: Emergency Medicine

## 2022-04-02 DIAGNOSIS — L720 Epidermal cyst: Secondary | ICD-10-CM

## 2022-04-02 HISTORY — DX: Pure hypercholesterolemia, unspecified: E78.00

## 2022-04-02 NOTE — ED Triage Notes (Signed)
Pt c/o lump under skin on left forearm that noticed today. Denies pain, itching. Denies any injury to area.  ?

## 2022-04-02 NOTE — Discharge Instructions (Signed)
I believe that you have a cyst.  Since this is not painful or bothersome I would monitor for the time being.  I would call your PCP and schedule an appointment so that they can obtain an ultrasound to investigate this further.  If you develop any additional symptoms including enlarging/painful cyst or if you develop arm swelling/pain/numbness you should be seen immediately for further evaluation.  You can use warm compress on this area for symptom relief. ?

## 2022-04-02 NOTE — ED Provider Notes (Signed)
?[facility] ? ? ? ?CSN: [id number] ?Arrival date & time: 04/02/22  1745 ? ? ?  ? ?History   ?Chief Complaint ?No chief complaint on file. ? ? ?HPI ?Garrie Woodin is a 41 y.o. male.  ? ?Patient presents today with a several hour history of lesion on his left forearm.  Reports that he was rubbing his hands when he felt a swelling/mass.  This is the first time he has had such a lesion.  He denies any pain, discomfort, enlargement.  He is physically active and denies any injury.  He denies any recent blood draws.  He has not tried any over-the-counter medication for symptom management.  He is able to perform daily activities without restriction.  Denies any recent IV or risk factors for VTE event including immobilization, recent COVID-19 infection, exogenous hormone use, active malignancy, recent travel, recent surgery, recent hospitalization. ? ? ?Past Medical History:  ?Diagnosis Date  ? Back symptoms, other   ? Chronic back pain   ? High cholesterol   ? Hypertension   ? Idiopathic thrombocytopenic purpura (ITP) (HCC)   ? ? ?Patient Active Problem List  ? Diagnosis Date Noted  ? Mixed hyperlipidemia 03/20/2021  ? Chronic left-sided low back pain with left-sided sciatica 12/12/2020  ? Elevated coronary artery calcium score 01/20/2020  ? Atypical chest pain 12/21/2019  ? Family history of early CAD 12/21/2019  ? Essential hypertension 12/21/2019  ? ? ?Past Surgical History:  ?Procedure Laterality Date  ? BACK SURGERY    ? LEG SURGERY    ? ? ? ? ? ?Home Medications   ? ?Prior to Admission medications   ?Medication Sig Start Date End Date Taking? Authorizing Provider  ?amLODipine (NORVASC) 10 MG tablet TAKE 1 TABLET (10 MG TOTAL) BY MOUTH DAILY. 03/19/22 03/19/23  Hoy Register, MD  ?EPINEPHrine 0.3 mg/0.3 mL IJ SOAJ injection Inject 0.3 mg into the muscle as needed for anaphylaxis. 05/01/21   Hoy Register, MD  ?hydrochlorothiazide (HYDRODIURIL) 25 MG tablet TAKE 1 TABLET (25 MG TOTAL) BY MOUTH DAILY.  03/19/22 03/19/23  Hoy Register, MD  ?metoprolol succinate (TOPROL-XL) 50 MG 24 hr tablet TAKE 1 TABLET (50 MG TOTAL) BY MOUTH DAILY. TAKE WITH OR IMMEDIATELY FOLLOWING A MEAL. 06/21/21 06/21/22  Patwardhan, Anabel Bene, MD  ?nitroGLYCERIN (NITROSTAT) 0.4 MG SL tablet PLACE 1 TABLET (0.4 MG TOTAL) UNDER THE TONGUE EVERY 5 (FIVE) MINUTES AS NEEDED FOR CHEST PAIN. 03/20/21 03/20/22  Patwardhan, Anabel Bene, MD  ?omeprazole (PRILOSEC) 20 MG capsule TAKE 1 CAPSULE (20 MG TOTAL) BY MOUTH 2 (TWO) TIMES DAILY BEFORE A MEAL. 03/19/22 03/19/23  Hoy Register, MD  ?oxyCODONE (ROXICODONE) 5 MG immediate release tablet Take 1 tablet (5 mg total) by mouth every 4 (four) hours as needed for severe pain. 09/19/21   Alvira Monday, MD  ?rosuvastatin (CRESTOR) 10 MG tablet TAKE 1 TABLET (10 MG TOTAL) BY MOUTH DAILY. 06/21/21 06/21/22  Elder Negus, MD  ? ? ?Family History ?Family History  ?Problem Relation Age of Onset  ? Hypertension Mother   ? Hypertension Father   ? Diabetes Maternal Grandmother   ? Diabetes Paternal Grandmother   ? ? ?Social History ?Social History  ? ?Tobacco Use  ? Smoking status: Former  ?  Packs/day: 0.50  ?  Years: 16.00  ?  Pack years: 8.00  ?  Types: Cigarettes  ?  Quit date: 2021  ?  Years since quitting: 2.2  ? Smokeless tobacco: Never  ?Vaping Use  ? Vaping Use:  Never used  ?Substance Use Topics  ? Alcohol use: Yes  ?  Comment: occasionally  ? Drug use: No  ? ? ? ?Allergies   ?Fish allergy, Shellfish allergy, Lisinopril, Peanut (diagnostic), and Sesame seed (diagnostic) ? ? ?Review of Systems ?Review of Systems  ?Constitutional:  Negative for activity change, appetite change, fatigue and fever.  ?Gastrointestinal:  Negative for abdominal pain, diarrhea, nausea and vomiting.  ?Skin:  Negative for color change and wound.  ?Neurological:  Negative for dizziness, light-headedness and headaches.  ? ? ?Physical Exam ?Triage Vital Signs ?ED Triage Vitals  ?Enc Vitals Group  ?   BP 04/02/22 1907 136/80  ?    Pulse Rate 04/02/22 1907 74  ?   Resp 04/02/22 1907 17  ?   Temp 04/02/22 1907 99.6 ?F (37.6 ?C)  ?   Temp Source 04/02/22 1907 Oral  ?   SpO2 04/02/22 1907 100 %  ?   Weight --   ?   Height --   ?   Head Circumference --   ?   Peak Flow --   ?   Pain Score 04/02/22 1904 0  ?   Pain Loc --   ?   Pain Edu? --   ?   Excl. in GC? --   ? ?No data found. ? ?Updated Vital Signs ?BP 136/80 (BP Location: Left Arm)   Pulse 74   Temp 99.6 ?F (37.6 ?C) (Oral)   Resp 17   SpO2 100%  ? ?Visual Acuity ?Right Eye Distance:   ?Left Eye Distance:   ?Bilateral Distance:   ? ?Right Eye Near:   ?Left Eye Near:    ?Bilateral Near:    ? ?Physical Exam ?Vitals reviewed.  ?Constitutional:   ?   General: He is awake.  ?   Appearance: Normal appearance. He is well-developed. He is not ill-appearing.  ?   Comments: Very pleasant male appears stated age in no acute distress sitting comfortably in exam room  ?HENT:  ?   Head: Normocephalic and atraumatic.  ?   Mouth/Throat:  ?   Pharynx: No oropharyngeal exudate, posterior oropharyngeal erythema or uvula swelling.  ?Cardiovascular:  ?   Rate and Rhythm: Normal rate and regular rhythm.  ?   Heart sounds: Normal heart sounds, S1 normal and S2 normal. No murmur heard. ?Pulmonary:  ?   Effort: Pulmonary effort is normal.  ?   Breath sounds: Normal breath sounds. No stridor. No wheezing, rhonchi or rales.  ?   Comments: Clear to auscultation bilaterally ?Musculoskeletal:  ?   Comments: Strength 5/5 bilateral upper and lower extremities  ?Skin: ? ?    ?   Comments: 1 cm x 1 cm mobile well-defined cystic lesion on dorsal forearm.  No erythema or fluctuance.  No tenderness to palpation.  ?Neurological:  ?   Mental Status: He is alert.  ?Psychiatric:     ?   Behavior: Behavior is cooperative.  ? ? ? ?UC Treatments / Results  ?Labs ?(all labs ordered are listed, but only abnormal results are displayed) ?Labs Reviewed - No data to display ? ?EKG ? ? ?Radiology ?No results  found. ? ?Procedures ?Procedures (including critical care time) ? ?Medications Ordered in UC ?Medications - No data to display ? ?Initial Impression / Assessment and Plan / UC Course  ?I have reviewed the triage vital signs and the nursing notes. ? ?Pertinent labs & imaging results that were available during my care of the patient were  reviewed by me and considered in my medical decision making (see chart for details). ? ?  ? ?No indication for I&D as there is evidence of abscess or fluctuance on exam.  No evidence of infection that would warrant initiation of antibiotics.  No suspicion for VTE event given clinical presentation and no risk factors.  Discussed that symptoms are consistent with benign cyst.  Recommend he follow-up with PCP for ultrasound to investigate this further.  Discussed that if this changes in any way and becomes larger or more painful he should return for reevaluation.  Discussed signs/symptoms of VTE event that would warrant emergent evaluation including pain, swelling, heat involving extremity.  Strict return precautions given to which patient expressed understanding. ? ?Final Clinical Impressions(s) / UC Diagnoses  ? ?Final diagnoses:  ?Cyst of skin and subcutaneous tissue  ? ? ? ?Discharge Instructions   ? ?  ?I believe that you have a cyst.  Since this is not painful or bothersome I would monitor for the time being.  I would call your PCP and schedule an appointment so that they can obtain an ultrasound to investigate this further.  If you develop any additional symptoms including enlarging/painful cyst or if you develop arm swelling/pain/numbness you should be seen immediately for further evaluation.  You can use warm compress on this area for symptom relief. ? ? ? ? ?ED Prescriptions   ?None ?  ? ?PDMP not reviewed this encounter. ?  ?Jeani HawkingRaspet, Cristian Davitt K, PA-C ?04/02/22 1924 ? ?

## 2022-04-18 ENCOUNTER — Other Ambulatory Visit: Payer: Self-pay

## 2022-05-05 ENCOUNTER — Other Ambulatory Visit: Payer: Self-pay

## 2022-05-19 ENCOUNTER — Other Ambulatory Visit: Payer: Self-pay | Admitting: Family Medicine

## 2022-05-19 ENCOUNTER — Other Ambulatory Visit: Payer: Self-pay

## 2022-05-19 DIAGNOSIS — I10 Essential (primary) hypertension: Secondary | ICD-10-CM

## 2022-05-20 ENCOUNTER — Other Ambulatory Visit: Payer: Self-pay | Admitting: Family Medicine

## 2022-05-20 DIAGNOSIS — I10 Essential (primary) hypertension: Secondary | ICD-10-CM

## 2022-05-21 ENCOUNTER — Other Ambulatory Visit: Payer: Self-pay

## 2022-05-21 MED ORDER — AMLODIPINE BESYLATE 10 MG PO TABS
ORAL_TABLET | Freq: Every day | ORAL | 0 refills | Status: DC
  Filled 2022-05-21 (×2): qty 30, 30d supply, fill #0

## 2022-05-21 MED ORDER — HYDROCHLOROTHIAZIDE 25 MG PO TABS
ORAL_TABLET | Freq: Every day | ORAL | 0 refills | Status: DC
  Filled 2022-05-21: qty 30, 30d supply, fill #0

## 2022-05-21 NOTE — Telephone Encounter (Signed)
Pt. Has appointment 05/23/22. Requested Prescriptions  Pending Prescriptions Disp Refills  . hydrochlorothiazide (HYDRODIURIL) 25 MG tablet 30 tablet 0    Sig: TAKE 1 TABLET (25 MG TOTAL) BY MOUTH DAILY.     Cardiovascular: Diuretics - Thiazide Failed - 05/20/2022  1:47 PM      Failed - Cr in normal range and within 180 days    Creatinine  Date Value Ref Range Status  03/22/2020 1.13 0.61 - 1.24 mg/dL Final   Creatinine, Ser  Date Value Ref Range Status  11/13/2021 1.07 0.76 - 1.27 mg/dL Final         Failed - K in normal range and within 180 days    Potassium  Date Value Ref Range Status  11/13/2021 4.1 3.5 - 5.2 mmol/L Final         Failed - Na in normal range and within 180 days    Sodium  Date Value Ref Range Status  11/13/2021 143 134 - 144 mmol/L Final         Failed - Valid encounter within last 6 months    Recent Outpatient Visits          6 months ago Primary hypertension   [facility] [place], Shea Stakes, NP   1 year ago Peanut allergy   [facility] Hoy Register, MD   1 year ago Essential hypertension   [facility] [place], Shea Stakes, NP   1 year ago Essential hypertension   [facility] [place], Shea Stakes, NP   1 year ago Essential hypertension   [facility] [place], Shea Stakes, NP      Future Appointments            In 2 days Claiborne Rigg, NP [facility]   In 1 month Patwardhan, Anabel Bene, MD [facility]           Passed - Last BP in normal range    BP Readings from Last 1 Encounters:  04/02/22 136/80         . amLODipine (NORVASC) 10 MG tablet 30 tablet 0    Sig: TAKE 1 TABLET (10 MG TOTAL) BY MOUTH DAILY.     Cardiovascular: Calcium Channel Blockers 2 Failed - 05/20/2022  1:47 PM      Failed - Valid encounter within last 6 months    Recent Outpatient  Visits          6 months ago Primary hypertension   [facility] [place], Shea Stakes, NP   1 year ago Peanut allergy   [facility] Hoy Register, MD   1 year ago Essential hypertension   [facility] Claiborne Rigg, NP   1 year ago Essential hypertension    [facility] Claiborne Rigg, NP   1 year ago Essential hypertension    [facility] [place], Shea Stakes, NP      Future Appointments            In 2 days Claiborne Rigg, NP [facility]   In 1 month Patwardhan, Anabel Bene, MD [facility]           Passed - Last BP in normal range    BP Readings from Last 1  Encounters:  04/02/22 136/80         Passed - Last Heart Rate in normal range    Pulse Readings from Last 1 Encounters:  04/02/22 74

## 2022-05-23 ENCOUNTER — Ambulatory Visit: Payer: Medicaid Other | Attending: Nurse Practitioner | Admitting: Nurse Practitioner

## 2022-05-23 ENCOUNTER — Encounter: Payer: Self-pay | Admitting: Nurse Practitioner

## 2022-05-23 ENCOUNTER — Other Ambulatory Visit: Payer: Self-pay

## 2022-05-23 VITALS — BP 135/85 | HR 74 | Wt 181.4 lb

## 2022-05-23 DIAGNOSIS — I10 Essential (primary) hypertension: Secondary | ICD-10-CM

## 2022-05-23 DIAGNOSIS — R7303 Prediabetes: Secondary | ICD-10-CM

## 2022-05-23 DIAGNOSIS — D693 Immune thrombocytopenic purpura: Secondary | ICD-10-CM

## 2022-05-23 MED ORDER — CHLORTHALIDONE 25 MG PO TABS
25.0000 mg | ORAL_TABLET | Freq: Every day | ORAL | 1 refills | Status: DC
  Filled 2022-05-23: qty 90, 90d supply, fill #0
  Filled 2022-08-26: qty 30, 30d supply, fill #1
  Filled 2022-10-20: qty 30, 30d supply, fill #2
  Filled 2022-11-24: qty 30, 30d supply, fill #3

## 2022-05-23 NOTE — Progress Notes (Signed)
Assessment & Plan:  Jeremiah Curtis was seen today for hypertension.  Diagnoses and all orders for this visit:  Essential hypertension -     chlorthalidone (HYGROTON) 25 MG tablet; Take 1 tablet (25 mg total) by mouth daily. -     CMP14+EGFR  Chronic ITP (idiopathic thrombocytopenia) (HCC) -     CBC  Prediabetes -     Hemoglobin A1c    Patient has been counseled on age-appropriate routine health concerns for screening and prevention. These are reviewed and up-to-date. Referrals have been placed accordingly. Immunizations are up-to-date or declined.    Subjective:   Chief Complaint  Patient presents with   Hypertension   HPI Jeremiah Curtis 41 y.o. male presents to office today  for follow up to HTN.  He has a past medical history of  Chronic back pain, High cholesterol, Hypertension, and Idiopathic thrombocytopenic purpura (ITP)    HTN Blood pressure is not quite at goal. I am switching his HCTZ to chlorthalidone today . He will continue on amlodipine 10 mg daily and toprol XL 50 mg daily.  BP Readings from Last 3 Encounters:  05/23/22 135/85  04/02/22 136/80  11/13/21 122/83    Chronic ITP He has evaluated by hematology. As recommended we Will refer back if platelets drop below 50  Prediabetes Well controlled without the use of any oral diabetic medications.  Lab Results  Component Value Date   HGBA1C 6.0 (H) 11/13/2021    Review of Systems  Constitutional:  Negative for fever, malaise/fatigue and weight loss.  HENT: Negative.  Negative for nosebleeds.   Eyes: Negative.  Negative for blurred vision, double vision and photophobia.  Respiratory: Negative.  Negative for cough and shortness of breath.   Cardiovascular: Negative.  Negative for chest pain, palpitations and leg swelling.  Gastrointestinal: Negative.  Negative for heartburn, nausea and vomiting.  Musculoskeletal: Negative.  Negative for myalgias.  Neurological: Negative.  Negative for dizziness, focal  weakness, seizures and headaches.  Psychiatric/Behavioral: Negative.  Negative for suicidal ideas.    Past Medical History:  Diagnosis Date   Back symptoms, other    Chronic back pain    High cholesterol    Hypertension    Idiopathic thrombocytopenic purpura (ITP) (HCC)     Past Surgical History:  Procedure Laterality Date   BACK SURGERY     LEG SURGERY      Family History  Problem Relation Age of Onset   Hypertension Mother    Hypertension Father    Diabetes Maternal Grandmother    Diabetes Paternal Grandmother     Social History Reviewed with no changes to be made today.   Outpatient Medications Prior to Visit  Medication Sig Dispense Refill   amLODipine (NORVASC) 10 MG tablet TAKE 1 TABLET (10 MG TOTAL) BY MOUTH ONCE DAILY. 30 tablet 0   EPINEPHrine 0.3 mg/0.3 mL IJ SOAJ injection Inject 0.3 mg into the muscle as needed for anaphylaxis. 2 each 2   metoprolol succinate (TOPROL-XL) 50 MG 24 hr tablet TAKE 1 TABLET (50 MG TOTAL) BY MOUTH DAILY. TAKE WITH OR IMMEDIATELY FOLLOWING A MEAL. 90 tablet 3   omeprazole (PRILOSEC) 20 MG capsule TAKE 1 CAPSULE (20 MG TOTAL) BY MOUTH 2 (TWO) TIMES DAILY BEFORE A MEAL. 60 capsule 1   oxyCODONE (ROXICODONE) 5 MG immediate release tablet Take 1 tablet (5 mg total) by mouth every 4 (four) hours as needed for severe pain. 12 tablet 0   rosuvastatin (CRESTOR) 10 MG tablet TAKE 1 TABLET (10  MG TOTAL) BY MOUTH DAILY. 90 tablet 3   hydrochlorothiazide (HYDRODIURIL) 25 MG tablet TAKE 1 TABLET (25 MG TOTAL) BY MOUTH DAILY. 30 tablet 0   nitroGLYCERIN (NITROSTAT) 0.4 MG SL tablet PLACE 1 TABLET (0.4 MG TOTAL) UNDER THE TONGUE EVERY 5 (FIVE) MINUTES AS NEEDED FOR CHEST PAIN. 25 tablet 3   No facility-administered medications prior to visit.    Allergies  Allergen Reactions   Fish Allergy Anaphylaxis   Shellfish Allergy Anaphylaxis   Lisinopril     Blurred vision   Peanut (Diagnostic) Hives   Sesame Seed (Diagnostic) Hives and Itching        Objective:    BP 135/85   Pulse 74   Wt 181 lb 6.4 oz (82.3 kg)   SpO2 100%   BMI 30.19 kg/m  Wt Readings from Last 3 Encounters:  05/23/22 181 lb 6.4 oz (82.3 kg)  11/13/21 180 lb 8 oz (81.9 kg)  07/25/21 178 lb (80.7 kg)    Physical Exam Vitals and nursing note reviewed.  Constitutional:      Appearance: He is well-developed.  HENT:     Head: Normocephalic and atraumatic.  Cardiovascular:     Rate and Rhythm: Normal rate and regular rhythm.     Heart sounds: Normal heart sounds. No murmur heard.   No friction rub. No gallop.  Pulmonary:     Effort: Pulmonary effort is normal. No tachypnea or respiratory distress.     Breath sounds: Normal breath sounds. No decreased breath sounds, wheezing, rhonchi or rales.  Chest:     Chest wall: No tenderness.  Abdominal:     General: Bowel sounds are normal.     Palpations: Abdomen is soft.  Musculoskeletal:        General: Normal range of motion.     Cervical back: Normal range of motion.  Skin:    General: Skin is warm and dry.  Neurological:     Mental Status: He is alert and oriented to person, place, and time.     Coordination: Coordination normal.  Psychiatric:        Behavior: Behavior normal. Behavior is cooperative.        Thought Content: Thought content normal.        Judgment: Judgment normal.         Patient has been counseled extensively about nutrition and exercise as well as the importance of adherence with medications and regular follow-up. The patient was given clear instructions to go to ER or return to medical center if symptoms don't improve, worsen or new problems develop. The patient verbalized understanding.   Follow-up: Return in about 4 weeks (around 06/20/2022) for BP CHECK WITH LUKE/BMP.   [name], FNP-BC [facility] and [facility] [place], [place]   05/23/2022, 7:04 PM

## 2022-05-24 LAB — CMP14+EGFR
ALT: 62 IU/L — ABNORMAL HIGH (ref 0–44)
AST: 47 IU/L — ABNORMAL HIGH (ref 0–40)
Albumin/Globulin Ratio: 1.8 (ref 1.2–2.2)
Albumin: 4.7 g/dL (ref 4.0–5.0)
Alkaline Phosphatase: 65 IU/L (ref 44–121)
BUN/Creatinine Ratio: 19 (ref 9–20)
BUN: 20 mg/dL (ref 6–24)
Bilirubin Total: 0.2 mg/dL (ref 0.0–1.2)
CO2: 25 mmol/L (ref 20–29)
Calcium: 10 mg/dL (ref 8.7–10.2)
Chloride: 101 mmol/L (ref 96–106)
Creatinine, Ser: 1.08 mg/dL (ref 0.76–1.27)
Globulin, Total: 2.6 g/dL (ref 1.5–4.5)
Glucose: 80 mg/dL (ref 70–99)
Potassium: 3.9 mmol/L (ref 3.5–5.2)
Sodium: 141 mmol/L (ref 134–144)
Total Protein: 7.3 g/dL (ref 6.0–8.5)
eGFR: 89 mL/min/{1.73_m2} (ref >59)

## 2022-05-24 LAB — CBC
Hematocrit: 43.4 % (ref 37.5–51.0)
Hemoglobin: 14.5 g/dL (ref 13.0–17.7)
MCH: 29.1 pg (ref 26.6–33.0)
MCHC: 33.4 g/dL (ref 31.5–35.7)
MCV: 87 fL (ref 79–97)
Platelets: 86 10*3/uL — CL (ref 150–450)
RBC: 4.99 x10E6/uL (ref 4.14–5.80)
RDW: 12.9 % (ref 11.6–15.4)
WBC: 6.8 10*3/uL (ref 3.4–10.8)

## 2022-05-24 LAB — HEMOGLOBIN A1C
Est. average glucose Bld gHb Est-mCnc: 123 mg/dL
Hgb A1c MFr Bld: 5.9 % — ABNORMAL HIGH (ref 4.8–5.6)

## 2022-06-11 ENCOUNTER — Other Ambulatory Visit: Payer: Self-pay

## 2022-06-20 ENCOUNTER — Other Ambulatory Visit: Payer: Self-pay

## 2022-06-20 ENCOUNTER — Other Ambulatory Visit: Payer: Self-pay | Admitting: Cardiology

## 2022-06-20 DIAGNOSIS — I10 Essential (primary) hypertension: Secondary | ICD-10-CM

## 2022-06-20 MED ORDER — METOPROLOL SUCCINATE ER 50 MG PO TB24
ORAL_TABLET | Freq: Every day | ORAL | 3 refills | Status: DC
  Filled 2022-06-20: qty 30, 30d supply, fill #0
  Filled 2022-07-17: qty 30, 30d supply, fill #1
  Filled 2022-08-26: qty 30, 30d supply, fill #2
  Filled 2022-09-26 (×2): qty 30, 30d supply, fill #3
  Filled 2022-10-20: qty 30, 30d supply, fill #4
  Filled 2022-11-24: qty 30, 30d supply, fill #5
  Filled 2022-12-23 (×2): qty 30, 30d supply, fill #6
  Filled 2023-01-20 (×2): qty 30, 30d supply, fill #7
  Filled 2023-02-16 (×2): qty 30, 30d supply, fill #8
  Filled 2023-03-19: qty 30, 30d supply, fill #9
  Filled 2023-03-20: qty 90, 90d supply, fill #9

## 2022-06-23 ENCOUNTER — Other Ambulatory Visit: Payer: Self-pay | Admitting: Nurse Practitioner

## 2022-06-23 ENCOUNTER — Other Ambulatory Visit: Payer: Self-pay

## 2022-06-23 DIAGNOSIS — I10 Essential (primary) hypertension: Secondary | ICD-10-CM

## 2022-06-23 MED ORDER — AMLODIPINE BESYLATE 10 MG PO TABS
ORAL_TABLET | Freq: Every day | ORAL | 1 refills | Status: DC
  Filled 2022-06-23: qty 30, 30d supply, fill #0
  Filled 2022-07-17: qty 30, 30d supply, fill #1
  Filled 2022-08-26: qty 30, 30d supply, fill #2
  Filled 2022-09-26: qty 30, 30d supply, fill #3
  Filled 2022-10-20: qty 30, 30d supply, fill #4
  Filled 2022-11-24: qty 30, 30d supply, fill #5

## 2022-06-24 ENCOUNTER — Other Ambulatory Visit: Payer: Self-pay

## 2022-06-26 ENCOUNTER — Ambulatory Visit: Payer: Medicaid Other | Admitting: Cardiology

## 2022-06-27 ENCOUNTER — Other Ambulatory Visit: Payer: Self-pay

## 2022-06-30 ENCOUNTER — Ambulatory Visit: Payer: Medicaid Other | Admitting: Cardiology

## 2022-07-02 ENCOUNTER — Ambulatory Visit: Payer: Medicaid Other | Admitting: Cardiology

## 2022-07-02 ENCOUNTER — Encounter: Payer: Self-pay | Admitting: Cardiology

## 2022-07-02 VITALS — BP 135/80 | HR 74 | Temp 98.2°F | Resp 16 | Ht 65.0 in | Wt 178.0 lb

## 2022-07-02 DIAGNOSIS — I10 Essential (primary) hypertension: Secondary | ICD-10-CM

## 2022-07-02 DIAGNOSIS — E782 Mixed hyperlipidemia: Secondary | ICD-10-CM

## 2022-07-02 DIAGNOSIS — R931 Abnormal findings on diagnostic imaging of heart and coronary circulation: Secondary | ICD-10-CM

## 2022-07-02 NOTE — Progress Notes (Signed)
Patient referred by Claiborne Rigg, NP for chest pain  Subjective:   Jeremiah Curtis, male    DOB: 1981-08-25, 41 y.o.   MRN: [id number]   Chief Complaint  Patient presents with   Hypertension   Hyperlipidemia   Follow-up    51 year    41 year old African-American man with hypertension, h/o COVID in Dec 2020, elevated calcium score.  Patient is doing well. He denies chest pain, shortness of breath, palpitations, leg edema, orthopnea, PND, TIA/syncope. He has regular follow up with PCP.    Current Outpatient Medications:    amLODipine (NORVASC) 10 MG tablet, TAKE 1 TABLET (10 MG TOTAL) BY MOUTH ONCE DAILY., Disp: 90 tablet, Rfl: 1   chlorthalidone (HYGROTON) 25 MG tablet, Take 1 tablet (25 mg total) by mouth daily., Disp: 90 tablet, Rfl: 1   EPINEPHrine 0.3 mg/0.3 mL IJ SOAJ injection, Inject 0.3 mg into the muscle as needed for anaphylaxis., Disp: 2 each, Rfl: 2   metoprolol succinate (TOPROL-XL) 50 MG 24 hr tablet, TAKE 1 TABLET (50 MG TOTAL) BY MOUTH DAILY. TAKE WITH OR IMMEDIATELY FOLLOWING A MEAL., Disp: 90 tablet, Rfl: 3   nitroGLYCERIN (NITROSTAT) 0.4 MG SL tablet, PLACE 1 TABLET (0.4 MG TOTAL) UNDER THE TONGUE EVERY 5 (FIVE) MINUTES AS NEEDED FOR CHEST PAIN., Disp: 25 tablet, Rfl: 3   omeprazole (PRILOSEC) 20 MG capsule, TAKE 1 CAPSULE (20 MG TOTAL) BY MOUTH 2 (TWO) TIMES DAILY BEFORE A MEAL., Disp: 60 capsule, Rfl: 1   oxyCODONE (ROXICODONE) 5 MG immediate release tablet, Take 1 tablet (5 mg total) by mouth every 4 (four) hours as needed for severe pain., Disp: 12 tablet, Rfl: 0   rosuvastatin (CRESTOR) 10 MG tablet, TAKE 1 TABLET (10 MG TOTAL) BY MOUTH DAILY., Disp: 90 tablet, Rfl: 3    Cardiovascular and other pertinent studies:  EKG 03/20/2021: Sinus rhythm 78 bpm  Inferior T wave inversion, consider ischemia Unchanged compared to previous EKG  Echocardiogram 01/05/2020: Normal LV systolic function with EF 61%. Moderate concentric hypertrophy of the left  ventricle. Left ventricle cavity is normal in size. Normal global wall motion. Normal diastolic filling pattern.  Mild mitral valve thickening. Trace mitral regurgitation. Trace tricuspid regurgitation. Inadequate TR jet to estimate pulmonary artery systolic pressure. Normal right atrial pressure.   Lexiscan Sestamibi stress test 12/28/2019: No previous exam available for comparison. Lexiscan/walking nuclear stress test performed using 1-day protocol. Stress EKG is non-diagnostic, as this is pharmacological stress test. In addition, stress EKG showed sinus tachycardia, inferolateral T wave inversion with no worsening.  Myocardial perfusion imaging is normal. Left ventricular ejection fraction is 58% with normal wall motion.  Low risk study.  12/21/2019: Calcium score: 22. LM: 0. LAD: 14. Cx: 8. RCA: 0  Heart size is Normal Visible lung fields are: clear Lymph nodes: Not enlarged Abdomen: No visible lesions   EKG 12/02/2019: Sinus rhythm 85 bpm. Borderline for LVH. Inferolateral T wave inversion, possibly due to LVH. Uncahnged compared to previous EKG's.   Recent labs: 05/23/2022: Glucose 80, BUN/Cr 20/1.08. EGFR 89. Na/K 141/3.9. AST/ALT 47/62. Rest of the CMP normal H/H 14/43. MCV 87. Platelets 86 HbA1C 5.9%  12/20/2020: Glucose 84, BUN/Cr 19/1.07. EGFR 101. Na/K 141/4.4.  Chol 155, TG 76, HDL 48, LDL 92   Review of Systems  Cardiovascular:  Positive for chest pain. Negative for dyspnea on exertion, leg swelling, palpitations and syncope.        Vitals:   07/02/22 1441  BP: 135/80  Pulse: 74  Resp: 16  Temp: 98.2 F (36.8 C)  SpO2: 99%     Body mass index is 29.62 kg/m. Filed Weights   07/02/22 1441  Weight: 178 lb (80.7 kg)     Objective:   Physical Exam Vitals and nursing note reviewed.  Constitutional:      Appearance: He is well-developed.  Neck:     Vascular: No JVD.  Cardiovascular:     Rate and Rhythm: Normal rate and regular rhythm.      Pulses: Intact distal pulses.     Heart sounds: Normal heart sounds. No murmur heard. Pulmonary:     Effort: Pulmonary effort is normal.     Breath sounds: Normal breath sounds. No wheezing or rales.          Assessment & Recommendations:    41 year old African-American man with hypertension, h/o COVID in Dec 2020, elevated calcium score.  Elevated calcium score: Mildly elevated calcium score 22. Continue heart healthy diet and lifestyle. Continue Crestor 10 mg daily. Check lipid panel.  Hypertension: Controlled  PRN f/u  Nigel Mormon, MD [facility] Pager: [phone] Office: [phone]

## 2022-07-17 ENCOUNTER — Other Ambulatory Visit: Payer: Self-pay | Admitting: Cardiology

## 2022-07-17 ENCOUNTER — Other Ambulatory Visit: Payer: Self-pay

## 2022-07-17 DIAGNOSIS — R931 Abnormal findings on diagnostic imaging of heart and coronary circulation: Secondary | ICD-10-CM

## 2022-07-18 ENCOUNTER — Other Ambulatory Visit: Payer: Self-pay

## 2022-07-18 MED ORDER — ROSUVASTATIN CALCIUM 10 MG PO TABS
ORAL_TABLET | Freq: Every day | ORAL | 3 refills | Status: DC
  Filled 2022-07-18: qty 90, 90d supply, fill #0
  Filled 2022-10-20: qty 30, 30d supply, fill #1
  Filled 2022-11-24: qty 30, 30d supply, fill #2
  Filled 2022-12-23 (×2): qty 30, 30d supply, fill #3
  Filled 2023-01-20 (×2): qty 30, 30d supply, fill #4
  Filled 2023-02-16 (×2): qty 30, 30d supply, fill #5
  Filled 2023-03-19: qty 30, 30d supply, fill #6
  Filled 2023-03-20: qty 90, 90d supply, fill #6

## 2022-08-26 ENCOUNTER — Other Ambulatory Visit: Payer: Self-pay

## 2022-08-27 ENCOUNTER — Other Ambulatory Visit: Payer: Self-pay

## 2022-09-12 DIAGNOSIS — E785 Hyperlipidemia, unspecified: Secondary | ICD-10-CM | POA: Insufficient documentation

## 2022-09-12 DIAGNOSIS — I10 Essential (primary) hypertension: Secondary | ICD-10-CM | POA: Insufficient documentation

## 2022-09-26 ENCOUNTER — Other Ambulatory Visit: Payer: Self-pay

## 2022-10-01 ENCOUNTER — Ambulatory Visit: Payer: Medicaid Other | Admitting: Nurse Practitioner

## 2022-10-20 ENCOUNTER — Other Ambulatory Visit: Payer: Self-pay | Admitting: Family Medicine

## 2022-10-20 DIAGNOSIS — K219 Gastro-esophageal reflux disease without esophagitis: Secondary | ICD-10-CM

## 2022-10-21 ENCOUNTER — Other Ambulatory Visit: Payer: Self-pay

## 2022-10-21 MED ORDER — OMEPRAZOLE 20 MG PO CPDR
DELAYED_RELEASE_CAPSULE | Freq: Two times a day (BID) | ORAL | 1 refills | Status: DC
  Filled 2022-10-21 – 2022-11-24 (×2): qty 60, 30d supply, fill #0

## 2022-11-24 ENCOUNTER — Emergency Department (HOSPITAL_COMMUNITY)
Admission: EM | Admit: 2022-11-24 | Discharge: 2022-11-25 | Disposition: A | Discharge status: Home / Self Care | Payer: No Typology Code available for payment source | Attending: Emergency Medicine | Admitting: Emergency Medicine

## 2022-11-24 ENCOUNTER — Encounter (HOSPITAL_COMMUNITY): Payer: Self-pay

## 2022-11-24 ENCOUNTER — Other Ambulatory Visit: Payer: Self-pay

## 2022-11-24 DIAGNOSIS — M25511 Pain in right shoulder: Secondary | ICD-10-CM | POA: Diagnosis present

## 2022-11-24 DIAGNOSIS — Y92410 Unspecified street and highway as the place of occurrence of the external cause: Secondary | ICD-10-CM | POA: Diagnosis not present
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
## 2022-11-25 ENCOUNTER — Emergency Department (HOSPITAL_COMMUNITY): Payer: No Typology Code available for payment source

## 2022-11-25 ENCOUNTER — Other Ambulatory Visit: Payer: Self-pay

## 2022-11-25 MED ORDER — IBUPROFEN 800 MG PO TABS
800.0000 mg | ORAL_TABLET | Freq: Once | ORAL | Status: AC
  Administered 2022-11-25: 800 mg via ORAL
  Filled 2022-11-25: qty 1

## 2022-11-25 MED ORDER — IBUPROFEN 600 MG PO TABS: 600.0000 mg | ORAL_TABLET | Freq: Four times a day (QID) | ORAL | 0 refills | Status: DC | PRN

## 2022-11-25 MED ORDER — CYCLOBENZAPRINE HCL 10 MG PO TABS: 10.0000 mg | ORAL_TABLET | Freq: Three times a day (TID) | ORAL | 0 refills | Status: DC | PRN
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
## 2022-12-01 ENCOUNTER — Encounter: Payer: Self-pay | Admitting: Nurse Practitioner

## 2022-12-01 ENCOUNTER — Ambulatory Visit: Payer: Medicaid Other | Attending: Nurse Practitioner | Admitting: Nurse Practitioner

## 2022-12-01 ENCOUNTER — Other Ambulatory Visit: Payer: Self-pay

## 2022-12-01 VITALS — BP 142/91 | HR 80 | Ht 65.0 in | Wt 179.6 lb

## 2022-12-01 DIAGNOSIS — E7800 Pure hypercholesterolemia, unspecified: Secondary | ICD-10-CM | POA: Insufficient documentation

## 2022-12-01 DIAGNOSIS — D693 Immune thrombocytopenic purpura: Secondary | ICD-10-CM

## 2022-12-01 DIAGNOSIS — I10 Essential (primary) hypertension: Secondary | ICD-10-CM

## 2022-12-01 DIAGNOSIS — Z79899 Other long term (current) drug therapy: Secondary | ICD-10-CM | POA: Insufficient documentation

## 2022-12-01 DIAGNOSIS — G8929 Other chronic pain: Secondary | ICD-10-CM | POA: Insufficient documentation

## 2022-12-01 DIAGNOSIS — K219 Gastro-esophageal reflux disease without esophagitis: Secondary | ICD-10-CM

## 2022-12-01 MED ORDER — IBUPROFEN 600 MG PO TABS
600.0000 mg | ORAL_TABLET | Freq: Four times a day (QID) | ORAL | 0 refills | Status: DC | PRN
  Filled 2022-12-01 – 2022-12-23 (×2): qty 30, 8d supply, fill #0

## 2022-12-01 MED ORDER — CYCLOBENZAPRINE HCL 10 MG PO TABS
10.0000 mg | ORAL_TABLET | Freq: Three times a day (TID) | ORAL | 0 refills | Status: DC | PRN
  Filled 2022-12-01 – 2022-12-23 (×2): qty 10, 4d supply, fill #0

## 2022-12-01 MED ORDER — CHLORTHALIDONE 25 MG PO TABS
25.0000 mg | ORAL_TABLET | Freq: Every day | ORAL | 1 refills | Status: DC
  Filled 2022-12-01: qty 90, 90d supply, fill #0
  Filled 2022-12-23: qty 30, 30d supply, fill #0
  Filled 2022-12-23: qty 90, 90d supply, fill #0
  Filled 2023-01-20 – 2023-03-20 (×4): qty 90, 90d supply, fill #1

## 2022-12-01 MED ORDER — OMEPRAZOLE 20 MG PO CPDR
20.0000 mg | DELAYED_RELEASE_CAPSULE | Freq: Two times a day (BID) | ORAL | 1 refills | Status: DC
  Filled 2022-12-01: qty 180, 90d supply, fill #0
  Filled 2022-12-10 – 2022-12-23 (×3): qty 60, 30d supply, fill #0
  Filled 2023-01-20 (×2): qty 60, 30d supply, fill #1
  Filled 2023-02-16 (×2): qty 60, 30d supply, fill #2
  Filled 2023-03-20: qty 60, 30d supply, fill #3

## 2022-12-01 MED ORDER — AMLODIPINE BESYLATE 10 MG PO TABS
10.0000 mg | ORAL_TABLET | Freq: Every day | ORAL | 1 refills | Status: DC
  Filled 2022-12-01: qty 90, 90d supply, fill #0
  Filled 2022-12-10 – 2022-12-23 (×3): qty 30, 30d supply, fill #0
  Filled 2023-01-20 (×2): qty 30, 30d supply, fill #1
  Filled 2023-02-16 (×2): qty 30, 30d supply, fill #2
  Filled 2023-03-19: qty 30, 30d supply, fill #3
  Filled 2023-03-20: qty 90, 90d supply, fill #3
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
## 2022-12-02 LAB — CMP14+EGFR
ALT: 15 IU/L (ref 0–44)
AST: 19 IU/L (ref 0–40)
Albumin/Globulin Ratio: 1.8 (ref 1.2–2.2)
Albumin: 4.9 g/dL (ref 4.1–5.1)
Alkaline Phosphatase: 57 IU/L (ref 44–121)
BUN/Creatinine Ratio: 19 (ref 9–20)
BUN: 19 mg/dL (ref 6–24)
Bilirubin Total: 0.3 mg/dL (ref 0.0–1.2)
CO2: 25 mmol/L (ref 20–29)
Calcium: 9.5 mg/dL (ref 8.7–10.2)
Chloride: 97 mmol/L (ref 96–106)
Creatinine, Ser: 1.02 mg/dL (ref 0.76–1.27)
Globulin, Total: 2.7 g/dL (ref 1.5–4.5)
Glucose: 83 mg/dL (ref 70–99)
Potassium: 3.5 mmol/L (ref 3.5–5.2)
Sodium: 139 mmol/L (ref 134–144)
Total Protein: 7.6 g/dL (ref 6.0–8.5)
eGFR: 95 mL/min/{1.73_m2} (ref >59)

## 2022-12-02 LAB — CBC WITH DIFFERENTIAL/PLATELET
Basophils Absolute: 0 10*3/uL (ref 0.0–0.2)
Basos: 1 %
EOS (ABSOLUTE): 0.1 10*3/uL (ref 0.0–0.4)
Eos: 2 %
Hematocrit: 46.3 % (ref 37.5–51.0)
Hemoglobin: 15.2 g/dL (ref 13.0–17.7)
Immature Grans (Abs): 0 10*3/uL (ref 0.0–0.1)
Immature Granulocytes: 0 %
Lymphocytes Absolute: 3 10*3/uL (ref 0.7–3.1)
Lymphs: 46 %
MCH: 28.6 pg (ref 26.6–33.0)
MCHC: 32.8 g/dL (ref 31.5–35.7)
MCV: 87 fL (ref 79–97)
Monocytes Absolute: 0.4 10*3/uL (ref 0.1–0.9)
Monocytes: 6 %
Neutrophils Absolute: 3 10*3/uL (ref 1.4–7.0)
Neutrophils: 45 %
Platelets: 92 10*3/uL — CL (ref 150–450)
RBC: 5.31 x10E6/uL (ref 4.14–5.80)
RDW: 13.3 % (ref 11.6–15.4)
WBC: 6.6 10*3/uL (ref 3.4–10.8)

## 2022-12-02 LAB — HEMOGLOBIN A1C
Est. average glucose Bld gHb Est-mCnc: 131 mg/dL
Hgb A1c MFr Bld: 6.2 % — ABNORMAL HIGH (ref 4.8–5.6)

## 2022-12-05 ENCOUNTER — Other Ambulatory Visit: Payer: Self-pay

## 2022-12-11 ENCOUNTER — Other Ambulatory Visit: Payer: Self-pay

## 2022-12-23 ENCOUNTER — Other Ambulatory Visit: Payer: Self-pay

## 2022-12-31 ENCOUNTER — Encounter: Payer: Self-pay | Admitting: Physician Assistant

## 2022-12-31 ENCOUNTER — Ambulatory Visit: Payer: Self-pay | Attending: Physician Assistant | Admitting: Physician Assistant

## 2022-12-31 ENCOUNTER — Other Ambulatory Visit: Payer: Self-pay

## 2022-12-31 VITALS — BP 137/97 | HR 80 | Wt 183.4 lb

## 2022-12-31 DIAGNOSIS — K0381 Cracked tooth: Secondary | ICD-10-CM

## 2022-12-31 DIAGNOSIS — Z79899 Other long term (current) drug therapy: Secondary | ICD-10-CM | POA: Insufficient documentation

## 2022-12-31 DIAGNOSIS — Z792 Long term (current) use of antibiotics: Secondary | ICD-10-CM | POA: Insufficient documentation

## 2022-12-31 MED ORDER — PENICILLIN V POTASSIUM 500 MG PO TABS
500.0000 mg | ORAL_TABLET | Freq: Three times a day (TID) | ORAL | 0 refills | Status: AC
  Filled 2022-12-31: qty 30, 10d supply, fill #0

## 2022-12-31 MED ORDER — IBUPROFEN 600 MG PO TABS
600.0000 mg | ORAL_TABLET | Freq: Four times a day (QID) | ORAL | 0 refills | Status: DC | PRN
  Filled 2022-12-31: qty 30, 8d supply, fill #0

## 2022-12-31 NOTE — Progress Notes (Signed)
Patient ID: Jeremiah Curtis, male   DOB: 10/13/1981, 42 y.o.   MRN: [id number]   Jeremiah Curtis, is a 42 y.o. male  [id number]  [id number]  DOB - 02-May-1981  Chief Complaint  Patient presents with   Dental Pain    Wanting referral        Subjective:   Jeremiah Curtis is a 42 y.o. male here today with a cracked tooth that happened 5 days ago.  He thinks he is getting an infection too.  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  No fever.     No problems updated.  ALLERGIES: Allergies  Allergen Reactions   Fish Allergy Anaphylaxis   Shellfish Allergy Anaphylaxis   Lisinopril     Blurred vision   Peanut (Diagnostic) Hives   Sesame Seed (Diagnostic) Hives and Itching    PAST MEDICAL HISTORY: Past Medical History:  Diagnosis Date   Back symptoms, other    Chronic back pain    High cholesterol    Hypertension    Idiopathic thrombocytopenic purpura (ITP) (HCC)     MEDICATIONS AT HOME: Prior to Admission medications   Medication Sig Start Date End Date Taking? Authorizing Provider  amLODipine (NORVASC) 10 MG tablet Take 1 tablet (10 mg total) by mouth daily. 12/01/22  Yes [name], NP  chlorthalidone (HYGROTON) 25 MG tablet Take 1 tablet (25 mg total) by mouth daily. 12/01/22  Yes [name], NP  cyclobenzaprine (FLEXERIL) 10 MG tablet Take 1 tablet (10 mg total) by mouth 3 (three) times daily as needed for muscle spasms. 12/01/22  Yes [name], NP  EPINEPHrine 0.3 mg/0.3 mL IJ SOAJ injection Inject 0.3 mg into the muscle as needed for anaphylaxis. 05/01/21  Yes Charlott Rakes, MD  metoprolol succinate (TOPROL-XL) 50 MG 24 hr tablet TAKE 1 TABLET (50 MG TOTAL) BY MOUTH DAILY. TAKE WITH OR IMMEDIATELY FOLLOWING A MEAL. 06/20/22 06/20/23 Yes Patwardhan, Reynold Bowen, MD  omeprazole (PRILOSEC) 20 MG capsule Take 1 capsule (20 mg total) by mouth 2 (two) times daily before a meal. 12/01/22  Yes [name], NP  oxyCODONE (ROXICODONE) 5 MG immediate  release tablet Take 1 tablet (5 mg total) by mouth every 4 (four) hours as needed for severe pain. 09/19/21  Yes Gareth Morgan, MD  penicillin v potassium (VEETID) 500 MG tablet Take 1 tablet (500 mg total) by mouth 3 (three) times daily for 10 days. 12/31/22 01/10/23 Yes Zacharius Funari, Dionne Bucy, PA-C  rosuvastatin (CRESTOR) 10 MG tablet TAKE 1 TABLET (10 MG TOTAL) BY MOUTH DAILY. 07/18/22 07/18/23 Yes Patwardhan, Manish J, MD  ibuprofen (ADVIL) 600 MG tablet Take 1 tablet (600 mg total) by mouth every 6 (six) hours as needed. 12/31/22   Argentina Donovan, PA-C  nitroGLYCERIN (NITROSTAT) 0.4 MG SL tablet PLACE 1 TABLET (0.4 MG TOTAL) UNDER THE TONGUE EVERY 5 (FIVE) MINUTES AS NEEDED FOR CHEST PAIN. 03/20/21 07/02/22  Patwardhan, Reynold Bowen, MD    ROS: Neg resp Neg cardiac Neg GI Neg GU Neg MS Neg psych Neg neuro  Objective:   Vitals:   12/31/22 1523  BP: (!) 137/97  Pulse: 80  SpO2: 99%  Weight: 183 lb 6.4 oz (83.2 kg)   Exam General appearance : Awake, alert, not in any distress. Speech Clear. Not toxic looking HEENT: Atraumatic and Normocephalic Tooth #3 cracked and the upper gum is erythematous.  Neck: Supple, no JVD. No cervical lymphadenopathy.  Chest: Good air entry bilaterally, CTAB.  No rales/rhonchi/wheezing CVS: S1  S2 regular, no murmurs.  Extremities: B/L Lower Ext shows no edema, both legs are warm to touch Neurology: Awake alert, and oriented X 3, CN II-XII intact, Non focal Skin: No Rash  Data Review Lab Results  Component Value Date   HGBA1C 6.2 (H) 12/01/2022   HGBA1C 5.9 (H) 05/23/2022   HGBA1C 6.0 (H) 11/13/2021    Assessment & Plan   1. Cracked tooth Will cover for abscess - penicillin v potassium (VEETID) 500 MG tablet; Take 1 tablet (500 mg total) by mouth 3 (three) times daily for 10 days.  Dispense: 30 tablet; Refill: 0 - ibuprofen (ADVIL) 600 MG tablet; Take 1 tablet (600 mg total) by mouth every 6 (six) hours as needed.  Dispense: 30 tablet; Refill: 0 -  Ambulatory referral to Dentistry    Return if symptoms worsen or fail to improve.  The patient was given clear instructions to go to ER or return to medical center if symptoms don't improve, worsen or new problems develop. The patient verbalized understanding. The patient was told to call to get lab results if they haven't heard anything in the next week.      Freeman Caldron, PA-C [facility] and [facility] [place], [place]   12/31/2022, 3:50 PM

## 2023-01-20 ENCOUNTER — Other Ambulatory Visit: Payer: Self-pay

## 2023-02-09 ENCOUNTER — Encounter (HOSPITAL_BASED_OUTPATIENT_CLINIC_OR_DEPARTMENT_OTHER): Payer: Self-pay

## 2023-02-09 ENCOUNTER — Emergency Department (HOSPITAL_BASED_OUTPATIENT_CLINIC_OR_DEPARTMENT_OTHER)
Admission: EM | Admit: 2023-02-09 | Discharge: 2023-02-09 | Disposition: A | Discharge status: Home / Self Care | Payer: Medicaid Other | Attending: Emergency Medicine | Admitting: Emergency Medicine

## 2023-02-09 ENCOUNTER — Other Ambulatory Visit: Payer: Self-pay

## 2023-02-09 DIAGNOSIS — U071 COVID-19: Secondary | ICD-10-CM | POA: Insufficient documentation

## 2023-02-09 DIAGNOSIS — R1032 Left lower quadrant pain: Secondary | ICD-10-CM | POA: Insufficient documentation

## 2023-02-09 DIAGNOSIS — Z79899 Other long term (current) drug therapy: Secondary | ICD-10-CM | POA: Insufficient documentation

## 2023-02-09 DIAGNOSIS — R110 Nausea: Secondary | ICD-10-CM

## 2023-02-09 DIAGNOSIS — Z91010 Allergy to peanuts: Secondary | ICD-10-CM | POA: Insufficient documentation

## 2023-02-09 LAB — CBC
HCT: 45.7 % (ref 39.0–52.0)
Hemoglobin: 15.5 g/dL (ref 13.0–17.0)
MCH: 29 pg (ref 26.0–34.0)
MCHC: 33.9 g/dL (ref 30.0–36.0)
MCV: 85.6 fL (ref 80.0–100.0)
Platelets: 81 K/uL — ABNORMAL LOW (ref 150–400)
RBC: 5.34 MIL/uL (ref 4.22–5.81)
RDW: 13.2 % (ref 11.5–15.5)
WBC: 5.3 K/uL (ref 4.0–10.5)
nRBC: 0 % (ref 0.0–0.2)

## 2023-02-09 LAB — URINALYSIS, ROUTINE W REFLEX MICROSCOPIC
Bilirubin Urine: NEGATIVE
Glucose, UA: NEGATIVE mg/dL
Hgb urine dipstick: NEGATIVE
Ketones, ur: NEGATIVE mg/dL
Leukocytes,Ua: NEGATIVE
Nitrite: NEGATIVE
Protein, ur: NEGATIVE mg/dL
Specific Gravity, Urine: 1.009 (ref 1.005–1.030)
pH: 6.5 (ref 5.0–8.0)

## 2023-02-09 LAB — COMPREHENSIVE METABOLIC PANEL WITH GFR
ALT: 18 U/L (ref 0–44)
AST: 27 U/L (ref 15–41)
Albumin: 4.8 g/dL (ref 3.5–5.0)
Alkaline Phosphatase: 43 U/L (ref 38–126)
Anion gap: 11 (ref 5–15)
BUN: 18 mg/dL (ref 6–20)
CO2: 27 mmol/L (ref 22–32)
Calcium: 9.8 mg/dL (ref 8.9–10.3)
Chloride: 97 mmol/L — ABNORMAL LOW (ref 98–111)
Creatinine, Ser: 1.14 mg/dL (ref 0.61–1.24)
GFR, Estimated: >60 mL/min
Glucose, Bld: 99 mg/dL (ref 70–99)
Potassium: 3.8 mmol/L (ref 3.5–5.1)
Sodium: 135 mmol/L (ref 135–145)
Total Bilirubin: 0.5 mg/dL (ref 0.3–1.2)
Total Protein: 8.4 g/dL — ABNORMAL HIGH (ref 6.5–8.1)

## 2023-02-09 LAB — RESP PANEL BY RT-PCR (RSV, FLU A&B, COVID)  RVPGX2
Influenza A by PCR: NEGATIVE
Influenza B by PCR: NEGATIVE
Resp Syncytial Virus by PCR: NEGATIVE
SARS Coronavirus 2 by RT PCR: POSITIVE — AB

## 2023-02-09 LAB — CBG MONITORING, ED: Glucose-Capillary: 109 mg/dL — ABNORMAL HIGH (ref 70–99)

## 2023-02-09 LAB — LIPASE, BLOOD: Lipase: 11 U/L (ref 11–51)

## 2023-02-09 MED ORDER — ONDANSETRON 4 MG PO TBDP: 4.0000 mg | ORAL_TABLET | Freq: Three times a day (TID) | ORAL | 0 refills | Status: DC | PRN

## 2023-02-09 NOTE — ED Notes (Signed)
Walked with pt pulse was at 93 O2 was @ 100 the entire time we walked no complaints at all pt stated that he felt ok

## 2023-02-09 NOTE — Discharge Instructions (Signed)
You tested positive for COVID-19 which likely explains your symptoms.  You are outside the window for Paxlovid.  Recommend Tylenol and ibuprofen for muscle aches and fever, Zofran ODT has been prescribed for nausea.  Continue to push fluid resuscitation orally.

## 2023-02-09 NOTE — ED Notes (Signed)
Pt in bed, sig other at bedside, pt states that he is ready to go home, work note given, pt verbalized understanding d/c and follow up, pt from dpt.

## 2023-02-09 NOTE — ED Provider Notes (Signed)
[facility] Provider Note   CSN: [id number] Arrival date & time: 02/09/23  1116     History  Chief Complaint  Patient presents with   Nausea    Jeremiah Curtis is a 42 y.o. male.  HPI   42 year old male presenting to the emergency department with nausea for the past several couple of days.  He states that he has felt nausea since this past Thursday.  He denies any other symptoms.  He denies any vomiting, abdominal pain at this time, no diarrhea.  He denies any fevers, chills or cough.  He denies any chest pain.  Had endorsed initially some left lower quad abdominal discomfort which she currently denies and he is currently comfortable eating a burrito.  Home Medications Prior to Admission medications   Medication Sig Start Date End Date Taking? Authorizing Provider  ondansetron (ZOFRAN-ODT) 4 MG disintegrating tablet Take 1 tablet (4 mg total) by mouth every 8 (eight) hours as needed. 02/09/23  Yes Regan Lemming, MD  amLODipine (NORVASC) 10 MG tablet Take 1 tablet (10 mg total) by mouth daily. 12/01/22   [name], NP  chlorthalidone (HYGROTON) 25 MG tablet Take 1 tablet (25 mg total) by mouth daily. 12/01/22   [name], NP  cyclobenzaprine (FLEXERIL) 10 MG tablet Take 1 tablet (10 mg total) by mouth 3 (three) times daily as needed for muscle spasms. 12/01/22   [name], NP  EPINEPHrine 0.3 mg/0.3 mL IJ SOAJ injection Inject 0.3 mg into the muscle as needed for anaphylaxis. 05/01/21   Charlott Rakes, MD  ibuprofen (ADVIL) 600 MG tablet Take 1 tablet (600 mg total) by mouth every 6 (six) hours as needed. 12/31/22   Argentina Donovan, PA-C  metoprolol succinate (TOPROL-XL) 50 MG 24 hr tablet TAKE 1 TABLET (50 MG TOTAL) BY MOUTH DAILY. TAKE WITH OR IMMEDIATELY FOLLOWING A MEAL. 06/20/22 06/20/23  Patwardhan, Reynold Bowen, MD  nitroGLYCERIN (NITROSTAT) 0.4 MG SL tablet PLACE 1 TABLET (0.4 MG TOTAL) UNDER THE TONGUE EVERY 5 (FIVE)  MINUTES AS NEEDED FOR CHEST PAIN. 03/20/21 07/02/22  Patwardhan, Reynold Bowen, MD  omeprazole (PRILOSEC) 20 MG capsule Take 1 capsule (20 mg total) by mouth 2 (two) times daily before a meal. 12/01/22   [name], NP  oxyCODONE (ROXICODONE) 5 MG immediate release tablet Take 1 tablet (5 mg total) by mouth every 4 (four) hours as needed for severe pain. 09/19/21   Gareth Morgan, MD  rosuvastatin (CRESTOR) 10 MG tablet TAKE 1 TABLET (10 MG TOTAL) BY MOUTH DAILY. 07/18/22 07/18/23  Nigel Mormon, MD      Allergies    Fish allergy, Shellfish allergy, Lisinopril, Peanut (diagnostic), and Sesame seed (diagnostic)    Review of Systems   Review of Systems  Gastrointestinal:  Positive for nausea.  All other systems reviewed and are negative.   Physical Exam Updated Vital Signs BP (!) 137/93 (BP Location: Right Arm)   Pulse 88   Temp 98.3 F (36.8 C)   Resp 16   Ht 5' 5"$  (1.651 m)   Wt 83.2 kg   SpO2 100%   BMI 30.52 kg/m  Physical Exam Vitals and nursing note reviewed.  Constitutional:      General: He is not in acute distress.    Appearance: He is well-developed.  HENT:     Head: Normocephalic and atraumatic.  Eyes:     Conjunctiva/sclera: Conjunctivae normal.  Cardiovascular:     Rate and Rhythm: Normal  rate and regular rhythm.  Pulmonary:     Effort: Pulmonary effort is normal. No respiratory distress.     Breath sounds: Normal breath sounds.  Abdominal:     Palpations: Abdomen is soft.     Tenderness: There is no abdominal tenderness. There is no guarding or rebound.  Musculoskeletal:        General: No swelling.     Cervical back: Neck supple.  Skin:    General: Skin is warm and dry.     Capillary Refill: Capillary refill takes less than 2 seconds.  Neurological:     Mental Status: He is alert.  Psychiatric:        Mood and Affect: Mood normal.     ED Results / Procedures / Treatments   Labs (all labs ordered are listed, but only abnormal results are  displayed) Labs Reviewed  RESP PANEL BY RT-PCR (RSV, FLU A&B, COVID)  RVPGX2 - Abnormal; Notable for the following components:      Result Value   SARS Coronavirus 2 by RT PCR POSITIVE (*)    All other components within normal limits  COMPREHENSIVE METABOLIC PANEL - Abnormal; Notable for the following components:   Chloride 97 (*)    Total Protein 8.4 (*)    All other components within normal limits  CBC - Abnormal; Notable for the following components:   Platelets 81 (*)    All other components within normal limits  URINALYSIS, ROUTINE W REFLEX MICROSCOPIC - Abnormal; Notable for the following components:   Color, Urine COLORLESS (*)    All other components within normal limits  CBG MONITORING, ED - Abnormal; Notable for the following components:   Glucose-Capillary 109 (*)    All other components within normal limits  LIPASE, BLOOD    EKG None  Radiology No results found.  Procedures Procedures    Medications Ordered in ED Medications - No data to display  ED Course/ Medical Decision Making/ A&P Clinical Course as of 02/09/23 1416  Mon Feb 09, 2023  1321 SARS Coronavirus 2 by RT PCR(!): POSITIVE [JL]  1322 Platelets(!): 81 [JL]    Clinical Course User Index [JL] Regan Lemming, MD                             Medical Decision Making Amount and/or Complexity of Data Reviewed Labs: ordered. Decision-making details documented in ED Course.  Risk Prescription drug management.    41 year old male presenting to the emergency department with nausea for the past several couple of days.  He states that he has felt nausea since this past Thursday.  He denies any other symptoms.  He denies any vomiting, abdominal pain at this time, no diarrhea.  He denies any fevers, chills or cough.  He denies any chest pain.  Had endorsed initially some left lower quad abdominal discomfort which she currently denies and he is currently comfortable eating a burrito.  On arrival, the  patient was afebrile, not tachycardic or tachypneic, BP 137/93, saturating 100% on room air.  Physical exam generally unremarkable lungs clear to auscultation bilaterally, abdomen soft, nontender, nondistended, no rebound or guarding.  Low concern for acute intra-abdominal emergency.  Laboratory workup initiated to include COVID-19 and influenza PCR testing which resulted positive for COVID-19.  The patient was found to have thrombocytopenia but he has a history of ITP and his platelets appear to be at baseline.  His CBG was normal his CMP was  unremarkable and his urinalysis was negative for UTI or hematuria and his lipase was normal.  Pulse oximetry on ambulation was stable.  Patient overall tolerating oral intake, no complaints at this time.  Will prescribe a prescription for Zofran ODT, advised Tylenol ibuprofen for symptomatic management and continued fluid resuscitation to maintain hydration.  Stable for outpatient follow-up.  Final Clinical Impression(s) / ED Diagnoses Final diagnoses:  COVID-19  Nausea    Rx / DC Orders ED Discharge Orders          Ordered    ondansetron (ZOFRAN-ODT) 4 MG disintegrating tablet  Every 8 hours PRN        02/09/23 1413              Regan Lemming, MD 02/09/23 1416

## 2023-02-09 NOTE — ED Triage Notes (Signed)
Patient here POV from Home.  Endorses Nausea for approximately 2 Weeks. No Emesis or Diarrhea. No Fevers. Some LLQ ABD Discomfort.   NAD Noted during Triage. A&OX4. GCS 15. Ambulatory.

## 2023-02-09 NOTE — ED Notes (Signed)
Pt in bed, pt denies vomiting, pt states that he is here for some nausea, resps even and unlabored, abd soft with bowel sounds, pt reports normal bm and denies urinary symptoms. Pt awaits md eval

## 2023-02-16 ENCOUNTER — Other Ambulatory Visit: Payer: Self-pay

## 2023-02-20 ENCOUNTER — Other Ambulatory Visit: Payer: Self-pay

## 2023-03-19 ENCOUNTER — Other Ambulatory Visit: Payer: Self-pay | Admitting: Cardiology

## 2023-03-19 DIAGNOSIS — R931 Abnormal findings on diagnostic imaging of heart and coronary circulation: Secondary | ICD-10-CM

## 2023-03-20 ENCOUNTER — Other Ambulatory Visit: Payer: Self-pay

## 2023-03-25 ENCOUNTER — Other Ambulatory Visit: Payer: Self-pay

## 2023-04-10 ENCOUNTER — Other Ambulatory Visit: Payer: Self-pay

## 2023-04-17 ENCOUNTER — Other Ambulatory Visit: Payer: Self-pay

## 2023-04-17 ENCOUNTER — Emergency Department (HOSPITAL_BASED_OUTPATIENT_CLINIC_OR_DEPARTMENT_OTHER)
Admission: EM | Admit: 2023-04-17 | Discharge: 2023-04-17 | Disposition: A | Discharge status: Home / Self Care | Payer: Medicaid Other | Attending: Emergency Medicine | Admitting: Emergency Medicine

## 2023-04-17 ENCOUNTER — Encounter (HOSPITAL_BASED_OUTPATIENT_CLINIC_OR_DEPARTMENT_OTHER): Payer: Self-pay | Admitting: Emergency Medicine

## 2023-04-17 ENCOUNTER — Other Ambulatory Visit (HOSPITAL_BASED_OUTPATIENT_CLINIC_OR_DEPARTMENT_OTHER): Payer: Self-pay

## 2023-04-17 DIAGNOSIS — Z91010 Allergy to peanuts: Secondary | ICD-10-CM | POA: Insufficient documentation

## 2023-04-17 DIAGNOSIS — R109 Unspecified abdominal pain: Secondary | ICD-10-CM | POA: Insufficient documentation

## 2023-04-17 LAB — CBC
HCT: 42.8 % (ref 39.0–52.0)
Hemoglobin: 14.5 g/dL (ref 13.0–17.0)
MCH: 29.5 pg (ref 26.0–34.0)
MCHC: 33.9 g/dL (ref 30.0–36.0)
MCV: 87.2 fL (ref 80.0–100.0)
Platelets: 92 10*3/uL — ABNORMAL LOW (ref 150–400)
RBC: 4.91 MIL/uL (ref 4.22–5.81)
RDW: 13.5 % (ref 11.5–15.5)
WBC: 6.2 10*3/uL (ref 4.0–10.5)
nRBC: 0 % (ref 0.0–0.2)

## 2023-04-17 LAB — URINALYSIS, ROUTINE W REFLEX MICROSCOPIC
Bilirubin Urine: NEGATIVE
Glucose, UA: NEGATIVE mg/dL
Hgb urine dipstick: NEGATIVE
Ketones, ur: NEGATIVE mg/dL
Leukocytes,Ua: NEGATIVE
Nitrite: NEGATIVE
Protein, ur: NEGATIVE mg/dL
Specific Gravity, Urine: 1.019 (ref 1.005–1.030)
pH: 7.5 (ref 5.0–8.0)

## 2023-04-17 LAB — BASIC METABOLIC PANEL
Anion gap: 9 (ref 5–15)
BUN: 18 mg/dL (ref 6–20)
CO2: 27 mmol/L (ref 22–32)
Calcium: 9.4 mg/dL (ref 8.9–10.3)
Chloride: 102 mmol/L (ref 98–111)
Creatinine, Ser: 0.95 mg/dL (ref 0.61–1.24)
GFR, Estimated: >60 mL/min (ref >60)
Glucose, Bld: 91 mg/dL (ref 70–99)
Potassium: 3.2 mmol/L — ABNORMAL LOW (ref 3.5–5.1)
Sodium: 138 mmol/L (ref 135–145)

## 2023-04-17 NOTE — ED Triage Notes (Addendum)
Pt via pov from home with right flank pain today. Pt reports it started in the back, wrapped around to his groin area. Pt also has a hernia in that area. Pt states the pain lasted about 5 minutes and then stopped; then he began to have cramping in his lower abdomen that has also resolved. Denies increased frequency of urination or hematuria. Pt alert & oriented, nad noted.

## 2023-04-17 NOTE — Discharge Instructions (Addendum)
1.  At this time your send is all resolved.  You do not need to do any other treatment currently. 2.  Your pain may have been due to a passed kidney stone.  Since the symptoms are resolved and your urine does not show any signs of infection or blood, no further testing is currently being done.  If you have recurrence of symptoms you may need further evaluation. 3.  You reported history of a hernia.  We reviewed educational information about hernias and the management.  Your discharge instructions include occasional information for hernias.  Your hernia may protrude but if it gets stuck and is painful you must return to the emergency department immediately.  Repair of hernias depends on the associated symptoms and size.  Discuss this with your doctor.

## 2023-04-17 NOTE — ED Provider Notes (Signed)
[place] EMERGENCY DEPARTMENT AT [facility] Provider Note   CSN: [id number] Arrival date & time: 04/17/23  1029     History  Chief Complaint  Patient presents with   Flank Pain    Jeremiah Curtis is a 42 y.o. male.  HPI Reports he had sudden onset of a warm and burning sensation on his right flank.  It occurred at about 830 this morning.  It radiated from his back down to the groin.  It stopped without actually going to the testicles.  Patient reports he is never experienced anything similar.  It spontaneously resolved.  Denies any prior history of kidney stones.  He reports he does have a right sided hernia and he did some lifting yesterday.  However, he did not have any sudden onset of pain with lifting.  He is also not experiencing pain into the testicle.  He reports usually if the hernia is a problem, he can feel some bulging.  He reports he was diagnosed with a hernia long time and not had any complications.    Home Medications Prior to Admission medications   Medication Sig Start Date End Date Taking? Authorizing Provider  amLODipine (NORVASC) 10 MG tablet Take 1 tablet (10 mg total) by mouth daily. 12/01/22   Claiborne Rigg, NP  chlorthalidone (HYGROTON) 25 MG tablet Take 1 tablet (25 mg total) by mouth daily. 12/01/22   Claiborne Rigg, NP  cyclobenzaprine (FLEXERIL) 10 MG tablet Take 1 tablet (10 mg total) by mouth 3 (three) times daily as needed for muscle spasms. 12/01/22   Claiborne Rigg, NP  EPINEPHrine 0.3 mg/0.3 mL IJ SOAJ injection Inject 0.3 mg into the muscle as needed for anaphylaxis. 05/01/21   Hoy Register, MD  ibuprofen (ADVIL) 600 MG tablet Take 1 tablet (600 mg total) by mouth every 6 (six) hours as needed. 12/31/22   Anders Simmonds, PA-C  metoprolol succinate (TOPROL-XL) 50 MG 24 hr tablet TAKE 1 TABLET (50 MG TOTAL) BY MOUTH DAILY. TAKE WITH OR IMMEDIATELY FOLLOWING A MEAL. 06/20/22 06/23/23  Patwardhan, Anabel Bene, MD  nitroGLYCERIN (NITROSTAT)  0.4 MG SL tablet PLACE 1 TABLET (0.4 MG TOTAL) UNDER THE TONGUE EVERY 5 (FIVE) MINUTES AS NEEDED FOR CHEST PAIN. 03/20/21 07/02/22  Patwardhan, Anabel Bene, MD  omeprazole (PRILOSEC) 20 MG capsule Take 1 capsule (20 mg total) by mouth 2 (two) times daily before a meal. 12/01/22   Claiborne Rigg, NP  ondansetron (ZOFRAN-ODT) 4 MG disintegrating tablet Take 1 tablet (4 mg total) by mouth every 8 (eight) hours as needed. 02/09/23   Ernie Avena, MD  oxyCODONE (ROXICODONE) 5 MG immediate release tablet Take 1 tablet (5 mg total) by mouth every 4 (four) hours as needed for severe pain. 09/19/21   Alvira Monday, MD  rosuvastatin (CRESTOR) 10 MG tablet TAKE 1 TABLET (10 MG TOTAL) BY MOUTH DAILY. 07/18/22 07/18/23  Elder Negus, MD      Allergies    Fish allergy, Shellfish allergy, Lisinopril, Peanut (diagnostic), and Sesame seed (diagnostic)    Review of Systems   Review of Systems  Physical Exam Updated Vital Signs BP (!) 121/49   Pulse 70   Temp 98.3 F (36.8 C) (Oral)   Resp 18   Ht  (1.651 m)   Wt 81.2 kg   SpO2 100%   BMI 29.79 kg/m  Physical Exam Constitutional:      Appearance: Normal appearance.  HENT:     Mouth/Throat:     Pharynx:  Oropharynx is clear.  Eyes:     Extraocular Movements: Extraocular movements intact.  Cardiovascular:     Rate and Rhythm: Normal rate and regular rhythm.  Pulmonary:     Effort: Pulmonary effort is normal.     Breath sounds: Normal breath sounds.  Abdominal:     General: There is no distension.     Palpations: Abdomen is soft.     Tenderness: There is no abdominal tenderness. There is no guarding.     Comments: No mass or fullness in the inguinal region.  Nontender.  No flank tenderness.  Musculoskeletal:        General: Normal range of motion.  Neurological:     General: No focal deficit present.     Mental Status: He is alert.  Psychiatric:        Mood and Affect: Mood normal.     ED Results / Procedures / Treatments    Labs (all labs ordered are listed, but only abnormal results are displayed) Labs Reviewed  BASIC METABOLIC PANEL - Abnormal; Notable for the following components:      Result Value   Potassium 3.2 (*)    All other components within normal limits  CBC - Abnormal; Notable for the following components:   Platelets 92 (*)    All other components within normal limits  URINALYSIS, ROUTINE W REFLEX MICROSCOPIC    EKG None  Radiology No results found.  Procedures Procedures    Medications Ordered in ED Medications - No data to display  ED Course/ Medical Decision Making/ A&P                             Medical Decision Making Amount and/or Complexity of Data Reviewed Labs: ordered.   Patient presents as outlined with complaints of a sudden onset of flank discomfort this morning.  It was self-limited.  He reports he had some abdominal cramping afterwards but is resolved.  Patient is asymptomatic and pain-free now.  Patient has a prior history of inguinal hernia however no evidence at this time of incarcerated hernia.  Physical exam does not show any tenderness or fullness in the inguinal region and patient has no scrotal or testicular pain.  Urinalysis negative for blood or signs of infection.  White count normal at 6.2.  Urinalysis negative.  At this time with symptoms completely resolved and no signs of UTI or blood in the urine, I do not feel patient needs further diagnostic imaging.  We discussed the possibility of a passed kidney stone.  We also discussed the nature of inguinal hernias.  We discussed what incarcerated hernias are in the emergent treatment.  We also discussed trying to manage hernias at home with lifting techniques and supine positioning and self reduction if necessary.  Patient voiced understanding and plans to discuss potential hernia repair with his PCP.        Final Clinical Impression(s) / ED Diagnoses Final diagnoses:  Right flank pain    Rx /  DC Orders ED Discharge Orders     None         Arby Barrette, MD 04/17/23 1332

## 2023-04-17 NOTE — ED Notes (Signed)
Pt verbalized understanding of d/c instructions, meds, and followup care. Denies questions. VSS, no distress noted. Steady gait to exit with all belongings.  ?

## 2023-04-20 ENCOUNTER — Other Ambulatory Visit: Payer: Self-pay | Admitting: Pharmacist

## 2023-04-20 NOTE — Patient Instructions (Signed)
Mr. Hoffmann,   It was great talking with you today!  Check your blood pressure once weekly, and any time you have concerning symptoms like headache, chest pain, dizziness, shortness of breath, or vision changes.   Our goal is less than 130/80.  To appropriately check your blood pressure, make sure you do the following:  1) Avoid caffeine, exercise, or tobacco products for 30 minutes before checking. Empty your bladder. 2) Sit with your back supported in a flat-backed chair. Rest your arm on something flat (arm of the chair, table, etc). 3) Sit still with your feet flat on the floor, resting, for at least 5 minutes.  4) Check your blood pressure. Take 1-2 readings.  5) Write down these readings and bring with you to any provider appointments.  Bring your home blood pressure machine with you to a provider's office for accuracy comparison at least once a year.   Make sure you take your blood pressure medications before you come to any office visit, even if you were asked to fast for labs.

## 2023-04-20 NOTE — Progress Notes (Signed)
Patient outreached by [name], PharmD Candidate on 04/20/2023 to discuss hypertension.    Patient has an automated home blood pressure machine. They report using their machine to take their BP once a month. They report home their last home reading from a photo as 108/69 on February 3rd, 2024.    Medication review was performed. They are taking medications as prescribed.   The following barriers to adherence were noted:  - They do not have cost concerns.  - They do not have transportation concerns.  - They do not need assistance obtaining refills.  - They do not occasionally forget to take some of their prescribed medications.  - They do not feel like one/some of their medications make them feel poorly.  - They do not have questions or concerns about their medications.  - They do have follow up scheduled with their primary care provider/cardiologist.   The following interventions were completed:  - Medications were reviewed  - Patient was educated on goal blood pressures and long term health implications of elevated blood pressure  - Patient was educated on proper technique to check home blood pressure and reminded to bring home machine and readings to next provider appointment. Patient was educated on the importance of checking their BP at least once per day.  - Patient was educated on use of adherence strategies, like a pill box or alarms. Patient reports using a pill box for their medications.  - Patient was counseled on lifestyle modifications to improve blood pressure, including diet and exercise.   The patient has follow up scheduled:  06/02/2023 with Dr. Angelica Chessman, PharmD Candidate     [name], PharmD, BCACP, CPP [facility] [phone]

## 2023-06-02 ENCOUNTER — Ambulatory Visit: Payer: Self-pay | Attending: Nurse Practitioner | Admitting: Nurse Practitioner

## 2023-06-02 ENCOUNTER — Encounter: Payer: Self-pay | Admitting: Nurse Practitioner

## 2023-06-02 VITALS — BP 116/76 | HR 80 | Ht 65.0 in | Wt 174.6 lb

## 2023-06-02 DIAGNOSIS — E786 Lipoprotein deficiency: Secondary | ICD-10-CM | POA: Insufficient documentation

## 2023-06-02 DIAGNOSIS — Z79899 Other long term (current) drug therapy: Secondary | ICD-10-CM | POA: Insufficient documentation

## 2023-06-02 DIAGNOSIS — R931 Abnormal findings on diagnostic imaging of heart and coronary circulation: Secondary | ICD-10-CM

## 2023-06-02 DIAGNOSIS — I10 Essential (primary) hypertension: Secondary | ICD-10-CM

## 2023-06-02 DIAGNOSIS — E876 Hypokalemia: Secondary | ICD-10-CM

## 2023-06-02 DIAGNOSIS — Z91013 Allergy to seafood: Secondary | ICD-10-CM

## 2023-06-02 DIAGNOSIS — R7303 Prediabetes: Secondary | ICD-10-CM

## 2023-06-02 DIAGNOSIS — K219 Gastro-esophageal reflux disease without esophagitis: Secondary | ICD-10-CM

## 2023-06-02 LAB — POCT GLYCOSYLATED HEMOGLOBIN (HGB A1C): HbA1c, POC (controlled diabetic range): 6 % (ref 0.0–7.0)

## 2023-06-02 MED ORDER — ROSUVASTATIN CALCIUM 10 MG PO TABS: ORAL_TABLET | Freq: Every day | ORAL | 3 refills | Status: DC

## 2023-06-02 MED ORDER — AMLODIPINE BESYLATE 10 MG PO TABS: 10.0000 mg | ORAL_TABLET | Freq: Every day | ORAL | 1 refills | Status: DC

## 2023-06-02 MED ORDER — EPINEPHRINE 0.3 MG/0.3ML IJ SOAJ: 0.3000 mg | INTRAMUSCULAR | 2 refills | Status: DC | PRN

## 2023-06-02 MED ORDER — ONDANSETRON 4 MG PO TBDP: 4.0000 mg | ORAL_TABLET | Freq: Three times a day (TID) | ORAL | 0 refills | Status: DC | PRN

## 2023-06-02 MED ORDER — METOPROLOL SUCCINATE ER 50 MG PO TB24: ORAL_TABLET | Freq: Every day | ORAL | 3 refills | Status: DC

## 2023-06-02 MED ORDER — OMEPRAZOLE 20 MG PO CPDR: 20.0000 mg | DELAYED_RELEASE_CAPSULE | Freq: Two times a day (BID) | ORAL | 1 refills | Status: DC

## 2023-06-02 MED ORDER — CHLORTHALIDONE 25 MG PO TABS: 25.0000 mg | ORAL_TABLET | Freq: Every day | ORAL | 1 refills | Status: DC

## 2023-06-02 NOTE — Progress Notes (Signed)
Assessment & Plan:  Ronav was seen today for hypertension and prediabetes.  Diagnoses and all orders for this visit:  Primary hypertension -     metoprolol succinate (TOPROL-XL) 50 MG 24 hr tablet; TAKE 1 TABLET (50 MG TOTAL) BY MOUTH DAILY. TAKE WITH OR IMMEDIATELY FOLLOWING A MEAL. -     chlorthalidone (HYGROTON) 25 MG tablet; Take 1 tablet (25 mg total) by mouth daily. -     amLODipine (NORVASC) 10 MG tablet; Take 1 tablet (10 mg total) by mouth daily. Continue all antihypertensives as prescribed.  Reminded to bring in blood pressure log for follow  up appointment.  RECOMMENDATIONS: DASH/Mediterranean Diets are healthier choices for HTN.    Prediabetes -     POCT glycosylated hemoglobin (Hb A1C)  Allergic to shellfish -     EPINEPHrine 0.3 mg/0.3 mL IJ SOAJ injection; Inject 0.3 mg into the muscle as needed for anaphylaxis.  GERD without esophagitis Well controlled -     omeprazole (PRILOSEC) 20 MG capsule; Take 1 capsule (20 mg total) by mouth 2 (two) times daily before a meal. -     ondansetron (ZOFRAN-ODT) 4 MG disintegrating tablet; Take 1 tablet (4 mg total) by mouth every 8 (eight) hours as needed. INSTRUCTIONS: Avoid GERD Triggers: acidic, spicy or fried foods, caffeine, coffee, sodas,  alcohol and chocolate.    Elevated coronary artery calcium score -     rosuvastatin (CRESTOR) 10 MG tablet; TAKE 1 TABLET (10 MG TOTAL) BY MOUTH DAILY.  Hypokalemia -     Potassium    Patient has been counseled on age-appropriate routine health concerns for screening and prevention. These are reviewed and up-to-date. Referrals have been placed accordingly. Immunizations are up-to-date or declined.    Subjective:   Chief Complaint  Patient presents with   Hypertension   Prediabetes   HPI Stafford Christel 42 y.o. male presents to office today for follow up to HTN and prediabetes  He has a past medical history of Chronic back pain, High cholesterol, Hypertension, and  Idiopathic thrombocytopenic purpura    HTN Blood pressure is well controlled. He is currently taking amlodipine 10 mg daily, chlorthalidone 25 mg daily and Toprol-XL 50 mg daily as prescribed  BP Readings from Last 3 Encounters:  06/02/23 116/76  04/20/23 108/69  04/17/23 (!) 121/49    Prediabetes A1c at goal today. He is not currently taking any diabetes medications.  Lab Results  Component Value Date   HGBA1C 6.0 06/02/2023     Review of Systems  Constitutional:  Negative for fever, malaise/fatigue and weight loss.  HENT: Negative.  Negative for nosebleeds.   Eyes: Negative.  Negative for blurred vision, double vision and photophobia.  Respiratory: Negative.  Negative for cough and shortness of breath.   Cardiovascular: Negative.  Negative for chest pain, palpitations and leg swelling.  Gastrointestinal: Negative.  Negative for heartburn, nausea and vomiting.  Musculoskeletal: Negative.  Negative for myalgias.  Neurological: Negative.  Negative for dizziness, focal weakness, seizures and headaches.  Psychiatric/Behavioral: Negative.  Negative for suicidal ideas.     Past Medical History:  Diagnosis Date   Back symptoms, other    Chronic back pain    High cholesterol    Hypertension    Idiopathic thrombocytopenic purpura (ITP) (HCC)     Past Surgical History:  Procedure Laterality Date   BACK SURGERY     LEG SURGERY      Family History  Problem Relation Age of Onset   Hypertension Mother  Cancer Mother    Hypertension Father    Diabetes Maternal Grandmother    Diabetes Paternal Grandmother     Social History Reviewed with no changes to be made today.   Outpatient Medications Prior to Visit  Medication Sig Dispense Refill   cyclobenzaprine (FLEXERIL) 10 MG tablet Take 1 tablet (10 mg total) by mouth 3 (three) times daily as needed for muscle spasms. 10 tablet 0   ibuprofen (ADVIL) 600 MG tablet Take 1 tablet (600 mg total) by mouth every 6 (six) hours as  needed. 30 tablet 0   nitroGLYCERIN (NITROSTAT) 0.4 MG SL tablet PLACE 1 TABLET (0.4 MG TOTAL) UNDER THE TONGUE EVERY 5 (FIVE) MINUTES AS NEEDED FOR CHEST PAIN. 25 tablet 3   amLODipine (NORVASC) 10 MG tablet Take 1 tablet (10 mg total) by mouth daily. 90 tablet 1   chlorthalidone (HYGROTON) 25 MG tablet Take 1 tablet (25 mg total) by mouth daily. 90 tablet 1   EPINEPHrine 0.3 mg/0.3 mL IJ SOAJ injection Inject 0.3 mg into the muscle as needed for anaphylaxis. 2 each 2   metoprolol succinate (TOPROL-XL) 50 MG 24 hr tablet TAKE 1 TABLET (50 MG TOTAL) BY MOUTH DAILY. TAKE WITH OR IMMEDIATELY FOLLOWING A MEAL. 90 tablet 3   omeprazole (PRILOSEC) 20 MG capsule Take 1 capsule (20 mg total) by mouth 2 (two) times daily before a meal. 180 capsule 1   ondansetron (ZOFRAN-ODT) 4 MG disintegrating tablet Take 1 tablet (4 mg total) by mouth every 8 (eight) hours as needed. 20 tablet 0   oxyCODONE (ROXICODONE) 5 MG immediate release tablet Take 1 tablet (5 mg total) by mouth every 4 (four) hours as needed for severe pain. 12 tablet 0   rosuvastatin (CRESTOR) 10 MG tablet TAKE 1 TABLET (10 MG TOTAL) BY MOUTH DAILY. 90 tablet 3   No facility-administered medications prior to visit.    Allergies  Allergen Reactions   Fish Allergy Anaphylaxis   Shellfish Allergy Anaphylaxis   Lisinopril     Blurred vision   Peanut (Diagnostic) Hives   Sesame Seed (Diagnostic) Hives and Itching       Objective:    BP 116/76 (BP Location: Left Arm, Patient Position: Sitting, Cuff Size: Normal)   Pulse 80   Ht 5\' 5"  (1.651 m)   Wt 174 lb 9.6 oz (79.2 kg)   SpO2 98%   BMI 29.05 kg/m  Wt Readings from Last 3 Encounters:  06/02/23 174 lb 9.6 oz (79.2 kg)  04/17/23 179 lb (81.2 kg)  02/09/23 183 lb 6.8 oz (83.2 kg)    Physical Exam Vitals and nursing note reviewed.  Constitutional:      Appearance: He is well-developed.  HENT:     Head: Normocephalic and atraumatic.  Cardiovascular:     Rate and Rhythm:  Normal rate and regular rhythm.     Heart sounds: Normal heart sounds. No murmur heard.    No friction rub. No gallop.  Pulmonary:     Effort: Pulmonary effort is normal. No tachypnea or respiratory distress.     Breath sounds: Normal breath sounds. No decreased breath sounds, wheezing, rhonchi or rales.  Chest:     Chest wall: No tenderness.  Abdominal:     General: Bowel sounds are normal.     Palpations: Abdomen is soft.  Musculoskeletal:        General: Normal range of motion.     Cervical back: Normal range of motion.  Skin:    General: Skin is  warm and dry.  Neurological:     Mental Status: He is alert and oriented to person, place, and time.     Coordination: Coordination normal.  Psychiatric:        Behavior: Behavior normal. Behavior is cooperative.        Thought Content: Thought content normal.        Judgment: Judgment normal.          Patient has been counseled extensively about nutrition and exercise as well as the importance of adherence with medications and regular follow-up. The patient was given clear instructions to go to ER or return to medical center if symptoms don't improve, worsen or new problems develop. The patient verbalized understanding.   Follow-up: Return in about 3 months (around 09/02/2023).   Claiborne Rigg, FNP-BC [facility] [place], [place] [phone]   06/02/2023, 6:06 PM

## 2023-06-03 LAB — POTASSIUM: Potassium: 3.4 mmol/L — ABNORMAL LOW (ref 3.5–5.2)

## 2023-06-22 ENCOUNTER — Telehealth: Payer: Self-pay | Admitting: Nurse Practitioner

## 2023-06-22 NOTE — Telephone Encounter (Signed)
Medication Refill - Medication:  metoprolol succinate (TOPROL-XL) 50 MG 24 hr tablet chlorthalidone (HYGROTON) 25 MG tablet rosuvastatin (CRESTOR) 10 MG tablet amLODipine (NORVASC) 10 MG tablet  Has the patient contacted their pharmacy? Yes.   Advised patient he had no refills  Preferred Pharmacy (with phone number or street name): [facility]  Phone: [phone] Fax: [phone]  Has the patient been seen for an appointment in the last year OR does the patient have an upcoming appointment? No.  Agent: Please be advised that RX refills may take up to 3 business days. We ask that you follow-up with your pharmacy.

## 2023-06-23 ENCOUNTER — Other Ambulatory Visit: Payer: Self-pay

## 2023-06-25 ENCOUNTER — Other Ambulatory Visit: Payer: Self-pay

## 2023-06-25 ENCOUNTER — Telehealth: Payer: Self-pay | Admitting: Nurse Practitioner

## 2023-06-25 DIAGNOSIS — K0381 Cracked tooth: Secondary | ICD-10-CM

## 2023-06-25 DIAGNOSIS — I10 Essential (primary) hypertension: Secondary | ICD-10-CM

## 2023-06-25 DIAGNOSIS — K219 Gastro-esophageal reflux disease without esophagitis: Secondary | ICD-10-CM

## 2023-06-25 DIAGNOSIS — R931 Abnormal findings on diagnostic imaging of heart and coronary circulation: Secondary | ICD-10-CM

## 2023-06-25 MED ORDER — IBUPROFEN 600 MG PO TABS
600.0000 mg | ORAL_TABLET | Freq: Four times a day (QID) | ORAL | 0 refills | Status: DC | PRN
  Filled 2023-06-25: qty 30, 8d supply, fill #0

## 2023-06-25 MED ORDER — ONDANSETRON 4 MG PO TBDP
4.0000 mg | ORAL_TABLET | Freq: Three times a day (TID) | ORAL | 0 refills | Status: DC | PRN
  Filled 2023-06-25: qty 20, 7d supply, fill #0

## 2023-06-25 MED ORDER — METOPROLOL SUCCINATE ER 50 MG PO TB24
50.0000 mg | ORAL_TABLET | Freq: Every day | ORAL | 3 refills | Status: DC
  Filled 2023-06-25: qty 90, 90d supply, fill #0

## 2023-06-25 MED ORDER — ROSUVASTATIN CALCIUM 10 MG PO TABS
10.0000 mg | ORAL_TABLET | Freq: Every day | ORAL | 3 refills | Status: DC
  Filled 2023-06-25: qty 90, 90d supply, fill #0

## 2023-06-25 MED ORDER — OMEPRAZOLE 20 MG PO CPDR
20.0000 mg | DELAYED_RELEASE_CAPSULE | Freq: Two times a day (BID) | ORAL | 1 refills | Status: DC
  Filled 2023-06-25: qty 180, 90d supply, fill #0

## 2023-06-25 MED ORDER — CHLORTHALIDONE 25 MG PO TABS
25.0000 mg | ORAL_TABLET | Freq: Every day | ORAL | 1 refills | Status: DC
  Filled 2023-06-25: qty 90, 90d supply, fill #0

## 2023-06-25 NOTE — Telephone Encounter (Signed)
Patient aware that medications were sent to the [facility] pharmacy that was listed on his file. I will send new medication to [facility].

## 2023-06-25 NOTE — Telephone Encounter (Signed)
Copied from CRM [id number]. Topic: General - Other >> Jun 25, 2023 12:07 PM Dondra Prader E wrote: Reason for CRM: Pt called following up on medication refill request and wants his PCP to know that he is completely out of his current supply.

## 2023-06-26 ENCOUNTER — Other Ambulatory Visit: Payer: Self-pay

## 2023-09-04 ENCOUNTER — Telehealth: Payer: Self-pay | Admitting: Nurse Practitioner

## 2023-09-04 ENCOUNTER — Encounter: Payer: Self-pay | Admitting: Nurse Practitioner

## 2023-09-04 ENCOUNTER — Ambulatory Visit: Payer: Self-pay | Attending: Nurse Practitioner | Admitting: Nurse Practitioner

## 2023-09-04 ENCOUNTER — Other Ambulatory Visit: Payer: Self-pay

## 2023-09-04 VITALS — BP 124/79 | HR 73 | Ht 65.0 in | Wt 173.4 lb

## 2023-09-04 DIAGNOSIS — I10 Essential (primary) hypertension: Secondary | ICD-10-CM

## 2023-09-04 DIAGNOSIS — M7918 Myalgia, other site: Secondary | ICD-10-CM

## 2023-09-04 DIAGNOSIS — D693 Immune thrombocytopenic purpura: Secondary | ICD-10-CM

## 2023-09-04 DIAGNOSIS — K219 Gastro-esophageal reflux disease without esophagitis: Secondary | ICD-10-CM

## 2023-09-04 DIAGNOSIS — Z7689 Persons encountering health services in other specified circumstances: Secondary | ICD-10-CM

## 2023-09-04 DIAGNOSIS — R7303 Prediabetes: Secondary | ICD-10-CM

## 2023-09-04 DIAGNOSIS — R931 Abnormal findings on diagnostic imaging of heart and coronary circulation: Secondary | ICD-10-CM

## 2023-09-04 MED ORDER — ROSUVASTATIN CALCIUM 10 MG PO TABS
10.0000 mg | ORAL_TABLET | Freq: Every day | ORAL | 3 refills | Status: DC
  Filled 2023-09-04: qty 90, 90d supply, fill #0
  Filled 2023-09-19: qty 30, 30d supply, fill #0
  Filled 2023-10-22 (×2): qty 30, 30d supply, fill #1
  Filled 2023-11-23: qty 30, 30d supply, fill #2
  Filled 2023-12-25: qty 30, 30d supply, fill #3
  Filled 2024-01-27: qty 30, 30d supply, fill #4
  Filled 2024-02-26: qty 30, 30d supply, fill #5
  Filled 2024-03-29: qty 30, 30d supply, fill #6
  Filled 2024-04-29: qty 30, 30d supply, fill #7
  Filled 2024-05-30: qty 30, 30d supply, fill #8
  Filled 2024-06-28: qty 30, 30d supply, fill #9
  Filled 2024-08-01: qty 30, 30d supply, fill #10
  Filled 2024-08-26: qty 30, 30d supply, fill #11

## 2023-09-04 MED ORDER — METOPROLOL SUCCINATE ER 50 MG PO TB24
50.0000 mg | ORAL_TABLET | Freq: Every day | ORAL | 3 refills | Status: DC
  Filled 2023-09-04: qty 90, 90d supply, fill #0
  Filled 2023-09-19: qty 30, 30d supply, fill #0
  Filled 2023-10-22 (×2): qty 30, 30d supply, fill #1
  Filled 2023-11-23: qty 30, 30d supply, fill #2
  Filled 2023-12-25: qty 30, 30d supply, fill #3
  Filled 2024-01-27: qty 30, 30d supply, fill #4
  Filled 2024-02-26: qty 30, 30d supply, fill #5
  Filled 2024-03-29: qty 30, 30d supply, fill #6
  Filled 2024-04-29: qty 30, 30d supply, fill #7
  Filled 2024-05-30: qty 30, 30d supply, fill #8
  Filled 2024-06-28: qty 30, 30d supply, fill #9
  Filled 2024-08-01: qty 30, 30d supply, fill #10
  Filled 2024-08-26: qty 30, 30d supply, fill #11

## 2023-09-04 MED ORDER — AMLODIPINE BESYLATE 10 MG PO TABS
10.0000 mg | ORAL_TABLET | Freq: Every day | ORAL | 1 refills | Status: DC
  Filled 2023-09-04 – 2023-09-15 (×2): qty 90, 90d supply, fill #0
  Filled 2023-10-22: qty 90, 90d supply, fill #1
  Filled 2023-11-23: qty 30, 30d supply, fill #1
  Filled 2023-12-25: qty 30, 30d supply, fill #2
  Filled 2024-01-27: qty 30, 30d supply, fill #3

## 2023-09-04 MED ORDER — AMLODIPINE BESYLATE 10 MG PO TABS
10.0000 mg | ORAL_TABLET | Freq: Every day | ORAL | 1 refills | Status: DC
  Filled 2023-09-04: qty 90, 90d supply, fill #0

## 2023-09-04 MED ORDER — CHLORTHALIDONE 25 MG PO TABS
25.0000 mg | ORAL_TABLET | Freq: Every day | ORAL | 1 refills | Status: DC
  Filled 2023-09-04: qty 90, 90d supply, fill #0
  Filled 2023-09-19: qty 30, 30d supply, fill #0
  Filled 2023-10-22 (×2): qty 30, 30d supply, fill #1
  Filled 2023-11-23: qty 30, 30d supply, fill #2
  Filled 2023-12-25: qty 30, 30d supply, fill #3
  Filled 2024-01-27: qty 30, 30d supply, fill #4
  Filled 2024-02-26: qty 30, 30d supply, fill #5

## 2023-09-04 MED ORDER — OMEPRAZOLE 20 MG PO CPDR
20.0000 mg | DELAYED_RELEASE_CAPSULE | Freq: Two times a day (BID) | ORAL | 1 refills | Status: DC
  Filled 2023-09-04: qty 180, 90d supply, fill #0

## 2023-09-04 MED ORDER — CYCLOBENZAPRINE HCL 10 MG PO TABS
10.0000 mg | ORAL_TABLET | Freq: Three times a day (TID) | ORAL | 0 refills | Status: DC | PRN
  Filled 2023-09-04: qty 10, 4d supply, fill #0

## 2023-09-04 NOTE — Telephone Encounter (Signed)
Pt requested prefer  to dental

## 2023-09-04 NOTE — Addendum Note (Signed)
Addended by: Arbie Cookey on: 09/04/2023 10:23 AM   Modules accepted: Orders

## 2023-09-04 NOTE — Progress Notes (Signed)
Assessment & Plan:  Diagnoses and all orders for this visit:  Primary hypertension -     CMP14+EGFR -     metoprolol succinate (TOPROL-XL) 50 MG 24 hr tablet; TAKE 1 TABLET (50 MG TOTAL) BY MOUTH DAILY. TAKE WITH OR IMMEDIATELY FOLLOWING A MEAL. -     chlorthalidone (HYGROTON) 25 MG tablet; Take 1 tablet (25 mg total) by mouth daily. -     amLODipine (NORVASC) 10 MG tablet; Take 1 tablet (10 mg total) by mouth daily. Continue all antihypertensives as prescribed.  Reminded to bring in blood pressure log for follow  up appointment.  RECOMMENDATIONS: DASH/Mediterranean Diets are healthier choices for HTN.    Prediabetes -     Hemoglobin A1c  Chronic ITP (idiopathic thrombocytopenia) (HCC) -     CBC with Differential  GERD without esophagitis -     omeprazole (PRILOSEC) 20 MG capsule; Take 1 capsule (20 mg total) by mouth 2 (two) times daily before a meal. INSTRUCTIONS: Avoid GERD Triggers: acidic, spicy or fried foods, caffeine, coffee, sodas,  alcohol and chocolate.    Elevated coronary artery calcium score -     rosuvastatin (CRESTOR) 10 MG tablet; Take 1 tablet (10 mg total) by mouth daily.  Myalgia, multiple sites Chronic and stable. -     cyclobenzaprine (FLEXERIL) 10 MG tablet; Take 1 tablet (10 mg total) by mouth 3 (three) times daily as needed for muscle spasms.    Patient has been counseled on age-appropriate routine health concerns for screening and prevention. These are reviewed and up-to-date. Referrals have been placed accordingly. Immunizations are up-to-date or declined.    Subjective:   Chief Complaint  Patient presents with   Medical Management of Chronic Issues   HPI Jeremiah Curtis 42 y.o. male presents to office today for follow up to HTN and prediabetes   He has a past medical history of Chronic back pain, High cholesterol, Hypertension, and Idiopathic thrombocytopenic purpura      HTN Blood pressure is well controlled. He is currently taking  amlodipine 10 mg daily, chlorthalidone 25 mg daily and Toprol-XL 50 mg daily as prescribed.  BP Readings from Last 3 Encounters:  09/04/23 124/79  06/02/23 116/76  04/20/23 108/69    Prediabetes Well controlled with diet only at this time.  Lab Results  Component Value Date   HGBA1C 6.0 06/02/2023    Chronic ITP Stable. Referral if platelets <50   Review of Systems  Constitutional:  Negative for fever, malaise/fatigue and weight loss.  HENT: Negative.  Negative for nosebleeds.   Eyes: Negative.  Negative for blurred vision, double vision and photophobia.  Respiratory: Negative.  Negative for cough and shortness of breath.   Cardiovascular: Negative.  Negative for chest pain, palpitations and leg swelling.  Gastrointestinal: Negative.  Negative for heartburn, nausea and vomiting.  Musculoskeletal:  Positive for joint pain. Negative for myalgias.  Neurological: Negative.  Negative for dizziness, focal weakness, seizures and headaches.  Psychiatric/Behavioral: Negative.  Negative for suicidal ideas.     Past Medical History:  Diagnosis Date   Back symptoms, other    Chronic back pain    High cholesterol    Hypertension    Idiopathic thrombocytopenic purpura (ITP) (HCC)     Past Surgical History:  Procedure Laterality Date   BACK SURGERY     LEG SURGERY      Family History  Problem Relation Age of Onset   Hypertension Mother    Cancer Mother    Hypertension Father  Diabetes Maternal Grandmother    Diabetes Paternal Grandmother     Social History Reviewed with no changes to be made today.   Outpatient Medications Prior to Visit  Medication Sig Dispense Refill   EPINEPHrine 0.3 mg/0.3 mL IJ SOAJ injection Inject 0.3 mg into the muscle as needed for anaphylaxis. 2 each 2   nitroGLYCERIN (NITROSTAT) 0.4 MG SL tablet PLACE 1 TABLET (0.4 MG TOTAL) UNDER THE TONGUE EVERY 5 (FIVE) MINUTES AS NEEDED FOR CHEST PAIN. 25 tablet 3   amLODipine (NORVASC) 10 MG tablet Take 1  tablet (10 mg total) by mouth daily. 90 tablet 1   chlorthalidone (HYGROTON) 25 MG tablet Take 1 tablet (25 mg total) by mouth daily. 90 tablet 1   cyclobenzaprine (FLEXERIL) 10 MG tablet Take 1 tablet (10 mg total) by mouth 3 (three) times daily as needed for muscle spasms. 10 tablet 0   ibuprofen (ADVIL) 600 MG tablet Take 1 tablet (600 mg total) by mouth every 6 (six) hours as needed. 30 tablet 0   metoprolol succinate (TOPROL-XL) 50 MG 24 hr tablet TAKE 1 TABLET (50 MG TOTAL) BY MOUTH DAILY. TAKE WITH OR IMMEDIATELY FOLLOWING A MEAL. 90 tablet 3   omeprazole (PRILOSEC) 20 MG capsule Take 1 capsule (20 mg total) by mouth 2 (two) times daily before a meal. 180 capsule 1   ondansetron (ZOFRAN-ODT) 4 MG disintegrating tablet Take 1 tablet (4 mg total) by mouth every 8 (eight) hours as needed. 20 tablet 0   rosuvastatin (CRESTOR) 10 MG tablet Take 1 tablet (10 mg total) by mouth daily. 90 tablet 3   No facility-administered medications prior to visit.    Allergies  Allergen Reactions   Fish Allergy Anaphylaxis   Shellfish Allergy Anaphylaxis   Lisinopril     Blurred vision   Peanut (Diagnostic) Hives   Sesame Seed (Diagnostic) Hives and Itching       Objective:    BP 124/79 (BP Location: Left Arm, Patient Position: Sitting, Cuff Size: Normal)   Pulse 73   Ht 5\' 5"  (1.651 m)   Wt 173 lb 6.4 oz (78.7 kg)   SpO2 99%   BMI 28.86 kg/m  Wt Readings from Last 3 Encounters:  09/04/23 173 lb 6.4 oz (78.7 kg)  06/02/23 174 lb 9.6 oz (79.2 kg)  04/17/23 179 lb (81.2 kg)    Physical Exam Vitals and nursing note reviewed.  Constitutional:      Appearance: He is well-developed.  HENT:     Head: Normocephalic and atraumatic.  Cardiovascular:     Rate and Rhythm: Normal rate and regular rhythm.     Heart sounds: Normal heart sounds. No murmur heard.    No friction rub. No gallop.  Pulmonary:     Effort: Pulmonary effort is normal. No tachypnea or respiratory distress.     Breath  sounds: Normal breath sounds. No decreased breath sounds, wheezing, rhonchi or rales.  Chest:     Chest wall: No tenderness.  Abdominal:     General: Bowel sounds are normal.     Palpations: Abdomen is soft.  Musculoskeletal:        General: Normal range of motion.     Cervical back: Normal range of motion.  Skin:    General: Skin is warm and dry.  Neurological:     Mental Status: He is alert and oriented to person, place, and time.     Coordination: Coordination normal.  Psychiatric:        Behavior: Behavior  normal. Behavior is cooperative.        Thought Content: Thought content normal.        Judgment: Judgment normal.          Patient has been counseled extensively about nutrition and exercise as well as the importance of adherence with medications and regular follow-up. The patient was given clear instructions to go to ER or return to medical center if symptoms don't improve, worsen or new problems develop. The patient verbalized understanding.   Follow-up: Return in about 6 months (around 03/03/2024).   Claiborne Rigg, FNP-BC [facility] [place], [place] [phone]   09/04/2023, 9:57 AM

## 2023-09-06 LAB — CBC WITH DIFFERENTIAL/PLATELET
Basophils Absolute: 0 10*3/uL (ref 0.0–0.2)
Basos: 0 %
EOS (ABSOLUTE): 0 10*3/uL (ref 0.0–0.4)
Eos: 1 %
Hematocrit: 43.9 % (ref 37.5–51.0)
Hemoglobin: 15.1 g/dL (ref 13.0–17.7)
Immature Grans (Abs): 0 10*3/uL (ref 0.0–0.1)
Immature Granulocytes: 0 %
Lymphocytes Absolute: 1.7 10*3/uL (ref 0.7–3.1)
Lymphs: 24 %
MCH: 30.4 pg (ref 26.6–33.0)
MCHC: 34.4 g/dL (ref 31.5–35.7)
MCV: 88 fL (ref 79–97)
Monocytes Absolute: 0.6 10*3/uL (ref 0.1–0.9)
Monocytes: 8 %
Neutrophils Absolute: 4.8 10*3/uL (ref 1.4–7.0)
Neutrophils: 67 %
Platelets: 83 10*3/uL — CL (ref 150–450)
RBC: 4.97 x10E6/uL (ref 4.14–5.80)
RDW: 13.2 % (ref 11.6–15.4)
WBC: 7.1 10*3/uL (ref 3.4–10.8)

## 2023-09-06 LAB — CMP14+EGFR
ALT: 15 IU/L (ref 0–44)
AST: 22 IU/L (ref 0–40)
Albumin: 4.5 g/dL (ref 4.1–5.1)
Alkaline Phosphatase: 53 IU/L (ref 44–121)
BUN/Creatinine Ratio: 16 (ref 9–20)
BUN: 17 mg/dL (ref 6–24)
Bilirubin Total: 0.4 mg/dL (ref 0.0–1.2)
CO2: 26 mmol/L (ref 20–29)
Calcium: 9.8 mg/dL (ref 8.7–10.2)
Chloride: 98 mmol/L (ref 96–106)
Creatinine, Ser: 1.09 mg/dL (ref 0.76–1.27)
Globulin, Total: 2.9 g/dL (ref 1.5–4.5)
Glucose: 85 mg/dL (ref 70–99)
Potassium: 3.6 mmol/L (ref 3.5–5.2)
Sodium: 140 mmol/L (ref 134–144)
Total Protein: 7.4 g/dL (ref 6.0–8.5)
eGFR: 87 mL/min/{1.73_m2} (ref >59)

## 2023-09-06 LAB — HEMOGLOBIN A1C
Est. average glucose Bld gHb Est-mCnc: 128 mg/dL
Hgb A1c MFr Bld: 6.1 % — ABNORMAL HIGH (ref 4.8–5.6)

## 2023-09-10 ENCOUNTER — Other Ambulatory Visit: Payer: Self-pay

## 2023-09-15 ENCOUNTER — Other Ambulatory Visit: Payer: Self-pay

## 2023-09-16 ENCOUNTER — Other Ambulatory Visit: Payer: Self-pay | Admitting: Pharmacist

## 2023-09-16 ENCOUNTER — Other Ambulatory Visit: Payer: Self-pay

## 2023-09-16 DIAGNOSIS — Z91013 Allergy to seafood: Secondary | ICD-10-CM

## 2023-09-16 MED ORDER — EPINEPHRINE 0.3 MG/0.3ML IJ SOAJ
0.3000 mg | INTRAMUSCULAR | 2 refills | Status: AC | PRN
  Filled 2023-09-16: qty 2, 9d supply, fill #0
  Filled 2023-10-19: qty 2, 30d supply, fill #0

## 2023-09-21 ENCOUNTER — Other Ambulatory Visit: Payer: Self-pay

## 2023-09-23 ENCOUNTER — Other Ambulatory Visit: Payer: Self-pay

## 2023-10-12 ENCOUNTER — Other Ambulatory Visit: Payer: Self-pay

## 2023-10-13 ENCOUNTER — Other Ambulatory Visit: Payer: Self-pay

## 2023-10-19 ENCOUNTER — Other Ambulatory Visit: Payer: Self-pay

## 2023-10-22 ENCOUNTER — Other Ambulatory Visit: Payer: Self-pay

## 2023-10-26 ENCOUNTER — Other Ambulatory Visit: Payer: Self-pay

## 2023-11-23 ENCOUNTER — Other Ambulatory Visit: Payer: Self-pay

## 2023-11-25 ENCOUNTER — Other Ambulatory Visit: Payer: Self-pay

## 2023-12-14 ENCOUNTER — Emergency Department (HOSPITAL_BASED_OUTPATIENT_CLINIC_OR_DEPARTMENT_OTHER): Payer: Self-pay

## 2023-12-14 ENCOUNTER — Other Ambulatory Visit: Payer: Self-pay

## 2023-12-14 ENCOUNTER — Encounter (HOSPITAL_BASED_OUTPATIENT_CLINIC_OR_DEPARTMENT_OTHER): Payer: Self-pay

## 2023-12-14 ENCOUNTER — Emergency Department (HOSPITAL_BASED_OUTPATIENT_CLINIC_OR_DEPARTMENT_OTHER)
Admission: EM | Admit: 2023-12-14 | Discharge: 2023-12-14 | Disposition: A | Discharge status: Home / Self Care | Payer: Self-pay | Attending: Emergency Medicine | Admitting: Emergency Medicine

## 2023-12-14 DIAGNOSIS — I10 Essential (primary) hypertension: Secondary | ICD-10-CM | POA: Insufficient documentation

## 2023-12-14 DIAGNOSIS — R1013 Epigastric pain: Secondary | ICD-10-CM | POA: Insufficient documentation

## 2023-12-14 DIAGNOSIS — F17210 Nicotine dependence, cigarettes, uncomplicated: Secondary | ICD-10-CM | POA: Insufficient documentation

## 2023-12-14 DIAGNOSIS — Z91010 Allergy to peanuts: Secondary | ICD-10-CM | POA: Insufficient documentation

## 2023-12-14 LAB — COMPREHENSIVE METABOLIC PANEL
ALT: 23 U/L (ref 0–44)
AST: 19 U/L (ref 15–41)
Albumin: 4.4 g/dL (ref 3.5–5.0)
Alkaline Phosphatase: 45 U/L (ref 38–126)
Anion gap: 7 (ref 5–15)
BUN: 23 mg/dL — ABNORMAL HIGH (ref 6–20)
CO2: 33 mmol/L — ABNORMAL HIGH (ref 22–32)
Calcium: 9.5 mg/dL (ref 8.9–10.3)
Chloride: 99 mmol/L (ref 98–111)
Creatinine, Ser: 1.08 mg/dL (ref 0.61–1.24)
GFR, Estimated: >60 mL/min (ref >60)
Glucose, Bld: 110 mg/dL — ABNORMAL HIGH (ref 70–99)
Potassium: 3.2 mmol/L — ABNORMAL LOW (ref 3.5–5.1)
Sodium: 139 mmol/L (ref 135–145)
Total Bilirubin: 0.4 mg/dL (ref <1.2)
Total Protein: 7.6 g/dL (ref 6.5–8.1)

## 2023-12-14 LAB — URINALYSIS, ROUTINE W REFLEX MICROSCOPIC
Bilirubin Urine: NEGATIVE
Glucose, UA: NEGATIVE mg/dL
Hgb urine dipstick: NEGATIVE
Ketones, ur: NEGATIVE mg/dL
Leukocytes,Ua: NEGATIVE
Nitrite: NEGATIVE
Specific Gravity, Urine: 1.03 (ref 1.005–1.030)
pH: 7 (ref 5.0–8.0)

## 2023-12-14 LAB — CBC WITH DIFFERENTIAL/PLATELET
Abs Immature Granulocytes: 0.01 10*3/uL (ref 0.00–0.07)
Basophils Absolute: 0 10*3/uL (ref 0.0–0.1)
Basophils Relative: 0 %
Eosinophils Absolute: 0.1 10*3/uL (ref 0.0–0.5)
Eosinophils Relative: 1 %
HCT: 44.3 % (ref 39.0–52.0)
Hemoglobin: 14.9 g/dL (ref 13.0–17.0)
Immature Granulocytes: 0 %
Lymphocytes Relative: 30 %
Lymphs Abs: 2.4 10*3/uL (ref 0.7–4.0)
MCH: 29.6 pg (ref 26.0–34.0)
MCHC: 33.6 g/dL (ref 30.0–36.0)
MCV: 87.9 fL (ref 80.0–100.0)
Monocytes Absolute: 0.5 10*3/uL (ref 0.1–1.0)
Monocytes Relative: 6 %
Neutro Abs: 5 10*3/uL (ref 1.7–7.7)
Neutrophils Relative %: 63 %
Platelets: 98 10*3/uL — ABNORMAL LOW (ref 150–400)
RBC: 5.04 MIL/uL (ref 4.22–5.81)
RDW: 13.3 % (ref 11.5–15.5)
WBC: 8 10*3/uL (ref 4.0–10.5)
nRBC: 0 % (ref 0.0–0.2)

## 2023-12-14 LAB — LIPASE, BLOOD: Lipase: 10 U/L — ABNORMAL LOW (ref 11–51)

## 2023-12-14 MED ORDER — MORPHINE SULFATE (PF) 4 MG/ML IV SOLN
4.0000 mg | Freq: Once | INTRAVENOUS | Status: DC
Start: 2023-12-14 — End: 2023-12-14
  Filled 2023-12-14: qty 1

## 2023-12-14 MED ORDER — POTASSIUM CHLORIDE CRYS ER 20 MEQ PO TBCR
40.0000 meq | EXTENDED_RELEASE_TABLET | Freq: Once | ORAL | Status: AC
  Administered 2023-12-14: 40 meq via ORAL
  Filled 2023-12-14: qty 2

## 2023-12-14 MED ORDER — IOHEXOL 300 MG/ML  SOLN
100.0000 mL | Freq: Once | INTRAMUSCULAR | Status: AC | PRN
  Administered 2023-12-14: 85 mL via INTRAVENOUS

## 2023-12-14 MED ORDER — PANTOPRAZOLE SODIUM 20 MG PO TBEC
20.0000 mg | DELAYED_RELEASE_TABLET | Freq: Every day | ORAL | 0 refills | Status: DC
Start: 2023-12-14 — End: 2024-03-22
  Filled 2023-12-14: qty 30, 30d supply, fill #0

## 2023-12-14 NOTE — ED Provider Notes (Signed)
[place] EMERGENCY DEPARTMENT AT [facility] Provider Note   CSN: [id number] Arrival date & time: 12/14/23  1002     History  Chief Complaint  Patient presents with   Abdominal Pain    Jeremiah Curtis is a 42 y.o. male.  With a history of hypertension, hypercholesterolemia presenting to the ED for evaluation of abdominal pain.  Symptoms began 2 days ago and were intermittent.  Since yesterday his pain has been constant.  Described as an aching and cramping sensation.  Pain is generalized throughout the abdomen.  Pain was initially improved with oral intake but this is no longer the case.  He denies any nausea, vomiting, diarrhea, constipation, urinary complaints.  No fevers or chills.  States he smokes cigarettes.  Denies recreational drug use.  He reports social alcohol use and typically drinks 1 bottle of liquor every weekend.   Abdominal Pain      Home Medications Prior to Admission medications   Medication Sig Start Date End Date Taking? Authorizing Provider  amLODipine (NORVASC) 10 MG tablet Take 1 tablet (10 mg total) by mouth daily. 09/04/23  Yes Claiborne Rigg, NP  chlorthalidone (HYGROTON) 25 MG tablet Take 1 tablet (25 mg total) by mouth daily. 09/04/23  Yes Claiborne Rigg, NP  EPINEPHrine 0.3 mg/0.3 mL IJ SOAJ injection Inject 0.3 mg into the muscle as needed for anaphylaxis. 09/16/23  Yes Hoy Register, MD  metoprolol succinate (TOPROL-XL) 50 MG 24 hr tablet TAKE 1 TABLET (50 MG TOTAL) BY MOUTH DAILY. TAKE WITH OR IMMEDIATELY FOLLOWING A MEAL. 09/04/23  Yes Claiborne Rigg, NP  omeprazole (PRILOSEC) 20 MG capsule Take 1 capsule (20 mg total) by mouth 2 (two) times daily before a meal. 09/04/23  Yes Claiborne Rigg, NP  pantoprazole (PROTONIX) 20 MG tablet Take 1 tablet (20 mg total) by mouth daily. 12/14/23  Yes Antwine Agosto, Edsel Petrin, PA-C  rosuvastatin (CRESTOR) 10 MG tablet Take 1 tablet (10 mg total) by mouth daily. 09/04/23  Yes Claiborne Rigg, NP   cyclobenzaprine (FLEXERIL) 10 MG tablet Take 1 tablet (10 mg total) by mouth 3 (three) times daily as needed for muscle spasms. 09/04/23   Claiborne Rigg, NP  nitroGLYCERIN (NITROSTAT) 0.4 MG SL tablet PLACE 1 TABLET (0.4 MG TOTAL) UNDER THE TONGUE EVERY 5 (FIVE) MINUTES AS NEEDED FOR CHEST PAIN. 03/20/21 09/04/23  Elder Negus, MD      Allergies    Fish allergy, Shellfish allergy, Lisinopril, Peanut (diagnostic), and Sesame seed (diagnostic)    Review of Systems   Review of Systems  Gastrointestinal:  Positive for abdominal pain.  All other systems reviewed and are negative.   Physical Exam Updated Vital Signs BP 123/85 (BP Location: Right Arm)   Pulse (!) 55   Temp 98.2 F (36.8 C) (Oral)   Resp 18   Ht 5\' 5"  (1.651 m)   Wt 80.7 kg   SpO2 100%   BMI 29.62 kg/m  Physical Exam Vitals and nursing note reviewed.  Constitutional:      General: He is not in acute distress.    Appearance: He is well-developed. He is not ill-appearing, toxic-appearing or diaphoretic.     Comments: Resting comfortably in bed  HENT:     Head: Normocephalic and atraumatic.  Eyes:     Conjunctiva/sclera: Conjunctivae normal.  Cardiovascular:     Rate and Rhythm: Normal rate and regular rhythm.     Heart sounds: No murmur heard. Pulmonary:     Effort:  Pulmonary effort is normal. No respiratory distress.     Breath sounds: Normal breath sounds.  Abdominal:     Palpations: Abdomen is soft.     Tenderness: There is abdominal tenderness in the epigastric area.  Musculoskeletal:        General: No swelling.     Cervical back: Neck supple.  Skin:    General: Skin is warm and dry.     Capillary Refill: Capillary refill takes less than 2 seconds.  Neurological:     Mental Status: He is alert.  Psychiatric:        Mood and Affect: Mood normal.     ED Results / Procedures / Treatments   Labs (all labs ordered are listed, but only abnormal results are displayed) Labs Reviewed  CBC WITH  DIFFERENTIAL/PLATELET - Abnormal; Notable for the following components:      Result Value   Platelets 98 (*)    All other components within normal limits  COMPREHENSIVE METABOLIC PANEL - Abnormal; Notable for the following components:   Potassium 3.2 (*)    CO2 33 (*)    Glucose, Bld 110 (*)    BUN 23 (*)    All other components within normal limits  LIPASE, BLOOD - Abnormal; Notable for the following components:   Lipase 10 (*)    All other components within normal limits  URINALYSIS, ROUTINE W REFLEX MICROSCOPIC - Abnormal; Notable for the following components:   Protein, ur TRACE (*)    All other components within normal limits    EKG None  Radiology CT ABDOMEN PELVIS W CONTRAST Result Date: 12/14/2023 CLINICAL DATA:  Abdominal pain, acute, nonlocalized EXAM: CT ABDOMEN AND PELVIS WITH CONTRAST TECHNIQUE: Multidetector CT imaging of the abdomen and pelvis was performed using the standard protocol following bolus administration of intravenous contrast. RADIATION DOSE REDUCTION: This exam was performed according to the departmental dose-optimization program which includes automated exposure control, adjustment of the mA and/or kV according to patient size and/or use of iterative reconstruction technique. CONTRAST:  85mL OMNIPAQUE IOHEXOL 300 MG/ML  SOLN COMPARISON:  None Available. FINDINGS: Lower chest: No acute abnormality. Hepatobiliary: 2.2 cm cyst at the right hepatic dome. There are a few additional subcentimeter low-density lesions within the liver, too small to characterize. Liver is otherwise unremarkable. Unremarkable gallbladder. No hyperdense gallstone. No biliary dilatation. Pancreas: Unremarkable. No pancreatic ductal dilatation or surrounding inflammatory changes. Spleen: Normal in size without focal abnormality. Adrenals/Urinary Tract: Unremarkable adrenal glands. Kidneys enhance symmetrically without focal lesion, stone, or hydronephrosis. Ureters are nondilated. Urinary  bladder appears unremarkable for the degree of distention. Stomach/Bowel: Stomach is within normal limits. Appendix appears normal. No evidence of bowel wall thickening, distention, or inflammatory changes. Vascular/Lymphatic: No significant vascular findings are present. No enlarged abdominal or pelvic lymph nodes. Reproductive: Prostate is unremarkable. Other: No free fluid. No abdominopelvic fluid collection. No pneumoperitoneum. Small fat containing right inguinal hernia. Mild prominence of fat in the left inguinal canal. Musculoskeletal: No acute or significant osseous findings. Mild degenerative disc disease at L4-5 and L5-S1. IMPRESSION: 1. No acute abdominopelvic findings. 2. Small fat containing right inguinal hernia. Electronically Signed   By: Duanne Guess D.O.   On: 12/14/2023 13:14    Procedures Procedures    Medications Ordered in ED Medications  morphine (PF) 4 MG/ML injection 4 mg (4 mg Intravenous Patient Refused/Not Given 12/14/23 1121)  potassium chloride SA (KLOR-CON M) CR tablet 40 mEq (has no administration in time range)  iohexol (OMNIPAQUE) 300 MG/ML  solution 100 mL (85 mLs Intravenous Contrast Given 12/14/23 1209)    ED Course/ Medical Decision Making/ A&P                                 Medical Decision Making Amount and/or Complexity of Data Reviewed Labs: ordered. Radiology: ordered.  Risk Prescription drug management.  This patient presents to the ED for concern of abdominal pain, this involves an extensive number of treatment options, and is a complaint that carries with it a high risk of complications and morbidity. The differential diagnosis for generalized abdominal pain includes, but is not limited to AAA, gastroenteritis, appendicitis, Bowel obstruction, Bowel perforation. Gastroparesis, DKA, Hernia, Inflammatory bowel disease, mesenteric ischemia, pancreatitis, peritonitis SBP, volvulus.   My initial workup includes labs, imaging, pain  control  Additional history obtained from: Nursing notes from this visit.  I ordered, reviewed and interpreted labs which include: CBC, CMP, lipase, urinalysis no leukocytosis or anemia.  Hypokalemia 3.2.  Hyperglycemia 110.  I ordered imaging studies including CT abdomen pelvis I independently visualized and interpreted imaging which showed normal I agree with the radiologist interpretation  Afebrile, hemodynamically stable.  42 year old male presenting for evaluation of abdominal pain.  Localized mostly to the epigastric region with mild tenderness to palpation of this area as well.  Pain began 2 days ago.  No nausea, vomiting or bowel abnormalities.  He appears very well on physical exam.  Lab workup was reassuring.  Potassium replenished in the emergency department.  CT abdomen pelvis negative.  He is tolerating oral intake without difficulty.  Overall suspect gastritis.  May be secondary to his binge drinking.  He was encouraged to decrease his alcohol use.  He was sent prescription for Protonix symptoms.  He was encouraged to follow-up with his primary care provider in 1 week for reevaluation.  He was given return precautions.  Stable at discharge.  At this time there does not appear to be any evidence of an acute emergency medical condition and the patient appears stable for discharge with appropriate outpatient follow up. Diagnosis was discussed with patient who verbalizes understanding of care plan and is agreeable to discharge. I have discussed return precautions with patient who verbalizes understanding. Patient encouraged to follow-up with their PCP within 1 week. All questions answered.  Note: Portions of this report may have been transcribed using voice recognition software. Every effort was made to ensure accuracy; however, inadvertent computerized transcription errors may still be present.         Final Clinical Impression(s) / ED Diagnoses Final diagnoses:  Epigastric  abdominal pain    Rx / DC Orders ED Discharge Orders          Ordered    pantoprazole (PROTONIX) 20 MG tablet  Daily        12/14/23 1427              Michelle Piper, Cordelia Poche 12/14/23 1429    Derwood Kaplan, MD 12/15/23 [phone]

## 2023-12-14 NOTE — Discharge Instructions (Signed)
You have been seen today for your complaint of abdominal pain. Your lab work was reassuring. Your imaging was reassuring. Your discharge medications include Protonix.  Take this daily. Follow up with: Your primary care provider in 1 week for reevaluation Please seek immediate medical care if you develop any of the following symptoms: You cannot stop vomiting. Your pain is only in one part of the abdomen. Pain on the right side could be caused by appendicitis. You have bloody or black poop (stool), or poop that looks like tar. You have trouble breathing. You have chest pain. At this time there does not appear to be the presence of an emergent medical condition, however there is always the potential for conditions to change. Please read and follow the below instructions.  Do not take your medicine if  develop an itchy rash, swelling in your mouth or lips, or difficulty breathing; call 911 and seek immediate emergency medical attention if this occurs.  You may review your lab tests and imaging results in their entirety on your MyChart account.  Please discuss all results of fully with your primary care provider and other specialist at your follow-up visit.  Note: Portions of this text may have been transcribed using voice recognition software. Every effort was made to ensure accuracy; however, inadvertent computerized transcription errors may still be present.

## 2023-12-14 NOTE — ED Triage Notes (Signed)
In for eval of abd pain x2 days. Denies nausea, vomiting, or diarrhea. Denies urinary symptoms.

## 2023-12-25 ENCOUNTER — Other Ambulatory Visit: Payer: Self-pay

## 2024-01-23 ENCOUNTER — Emergency Department (HOSPITAL_BASED_OUTPATIENT_CLINIC_OR_DEPARTMENT_OTHER)
Admission: EM | Admit: 2024-01-23 | Discharge: 2024-01-23 | Disposition: A | Discharge status: Home / Self Care | Payer: Medicaid Other | Attending: Emergency Medicine | Admitting: Emergency Medicine

## 2024-01-23 ENCOUNTER — Emergency Department (HOSPITAL_BASED_OUTPATIENT_CLINIC_OR_DEPARTMENT_OTHER): Payer: Medicaid Other | Admitting: Radiology

## 2024-01-23 ENCOUNTER — Other Ambulatory Visit: Payer: Self-pay

## 2024-01-23 DIAGNOSIS — F17200 Nicotine dependence, unspecified, uncomplicated: Secondary | ICD-10-CM | POA: Diagnosis not present

## 2024-01-23 DIAGNOSIS — Z20822 Contact with and (suspected) exposure to covid-19: Secondary | ICD-10-CM | POA: Diagnosis not present

## 2024-01-23 DIAGNOSIS — B974 Respiratory syncytial virus as the cause of diseases classified elsewhere: Secondary | ICD-10-CM | POA: Diagnosis not present

## 2024-01-23 DIAGNOSIS — R059 Cough, unspecified: Secondary | ICD-10-CM | POA: Diagnosis not present

## 2024-01-23 DIAGNOSIS — R051 Acute cough: Secondary | ICD-10-CM | POA: Diagnosis not present

## 2024-01-23 DIAGNOSIS — Z91010 Allergy to peanuts: Secondary | ICD-10-CM | POA: Insufficient documentation

## 2024-01-23 DIAGNOSIS — R062 Wheezing: Secondary | ICD-10-CM | POA: Diagnosis not present

## 2024-01-23 DIAGNOSIS — B338 Other specified viral diseases: Secondary | ICD-10-CM

## 2024-01-23 LAB — RESP PANEL BY RT-PCR (RSV, FLU A&B, COVID)  RVPGX2
Influenza A by PCR: NEGATIVE
Influenza B by PCR: NEGATIVE
Resp Syncytial Virus by PCR: POSITIVE — AB
SARS Coronavirus 2 by RT PCR: NEGATIVE

## 2024-01-23 MED ORDER — AEROCHAMBER PLUS FLO-VU SMALL MISC
1.0000 | Freq: Once | Status: AC
  Administered 2024-01-23: 1
  Filled 2024-01-23: qty 1

## 2024-01-23 MED ORDER — BENZONATATE 100 MG PO CAPS: 100.0000 mg | ORAL_CAPSULE | Freq: Three times a day (TID) | ORAL | 0 refills | Status: DC

## 2024-01-23 MED ORDER — ALBUTEROL SULFATE HFA 108 (90 BASE) MCG/ACT IN AERS
2.0000 | INHALATION_SPRAY | RESPIRATORY_TRACT | Status: DC | PRN
  Administered 2024-01-23: 2 via RESPIRATORY_TRACT
  Filled 2024-01-23: qty 6.7

## 2024-01-23 NOTE — ED Notes (Addendum)
Went in room to get discharge VS and go over discharge paperwork and pt not in room. RT states she saw the pt walk out of the department. Attempted to call pt to go over AVS, no answer and not a secure VM so no message left.

## 2024-01-23 NOTE — ED Provider Notes (Signed)
[place] EMERGENCY DEPARTMENT AT [facility] Provider Note   CSN: [id number] Arrival date & time: 01/23/24  1703     History  Chief Complaint  Patient presents with   Cough    Jeremiah Curtis is a 43 y.o. male.  Patient with smoking history presents to the emergency department for cough over the past 4 days.  He has also had some wheezing which prompted emergency department visit today.  No fevers.  No ear pain or sore throat.  No chest pain or abdominal pain.  No vomiting or diarrhea.  No known sick contacts.  Denies history of asthma.  Takes medication for high blood pressure.       Home Medications Prior to Admission medications   Medication Sig Start Date End Date Taking? Authorizing Provider  benzonatate (TESSALON) 100 MG capsule Take 1 capsule (100 mg total) by mouth every 8 (eight) hours. 01/23/24  Yes Renne Crigler, PA-C  amLODipine (NORVASC) 10 MG tablet Take 1 tablet (10 mg total) by mouth daily. 09/04/23   Claiborne Rigg, NP  chlorthalidone (HYGROTON) 25 MG tablet Take 1 tablet (25 mg total) by mouth daily. 09/04/23   Claiborne Rigg, NP  cyclobenzaprine (FLEXERIL) 10 MG tablet Take 1 tablet (10 mg total) by mouth 3 (three) times daily as needed for muscle spasms. 09/04/23   Claiborne Rigg, NP  EPINEPHrine 0.3 mg/0.3 mL IJ SOAJ injection Inject 0.3 mg into the muscle as needed for anaphylaxis. 09/16/23   Hoy Register, MD  metoprolol succinate (TOPROL-XL) 50 MG 24 hr tablet TAKE 1 TABLET (50 MG TOTAL) BY MOUTH DAILY. TAKE WITH OR IMMEDIATELY FOLLOWING A MEAL. 09/04/23   Claiborne Rigg, NP  nitroGLYCERIN (NITROSTAT) 0.4 MG SL tablet PLACE 1 TABLET (0.4 MG TOTAL) UNDER THE TONGUE EVERY 5 (FIVE) MINUTES AS NEEDED FOR CHEST PAIN. 03/20/21 09/04/23  Patwardhan, Anabel Bene, MD  omeprazole (PRILOSEC) 20 MG capsule Take 1 capsule (20 mg total) by mouth 2 (two) times daily before a meal. 09/04/23   Claiborne Rigg, NP  pantoprazole (PROTONIX) 20 MG tablet Take 1 tablet (20  mg total) by mouth daily. 12/14/23   Schutt, Edsel Petrin, PA-C  rosuvastatin (CRESTOR) 10 MG tablet Take 1 tablet (10 mg total) by mouth daily. 09/04/23   Claiborne Rigg, NP      Allergies    Fish allergy, Shellfish allergy, Lisinopril, Peanut (diagnostic), and Sesame seed (diagnostic)    Review of Systems   Review of Systems  Physical Exam Updated Vital Signs BP (!) 125/90   Pulse 83   Temp 98.7 F (37.1 C) (Oral)   Resp 17   Wt 77.1 kg   SpO2 100%   BMI 28.29 kg/m  Physical Exam Vitals and nursing note reviewed.  Constitutional:      Appearance: He is well-developed.  HENT:     Head: Normocephalic and atraumatic.     Jaw: No trismus.     Right Ear: Tympanic membrane, ear canal and external ear normal.     Left Ear: Tympanic membrane, ear canal and external ear normal.     Nose: Congestion and rhinorrhea present. No mucosal edema.     Mouth/Throat:     Mouth: Mucous membranes are not dry.     Pharynx: Uvula midline. Posterior oropharyngeal erythema present. No oropharyngeal exudate or uvula swelling.     Tonsils: No tonsillar abscesses.  Eyes:     General:        Right eye: No discharge.  Left eye: No discharge.     Conjunctiva/sclera: Conjunctivae normal.  Cardiovascular:     Rate and Rhythm: Normal rate and regular rhythm.     Heart sounds: Normal heart sounds.  Pulmonary:     Effort: Pulmonary effort is normal. No respiratory distress.     Breath sounds: Normal breath sounds. No wheezing or rales.  Abdominal:     Palpations: Abdomen is soft.     Tenderness: There is no abdominal tenderness.  Musculoskeletal:     Cervical back: Normal range of motion and neck supple.  Skin:    General: Skin is warm and dry.  Neurological:     Mental Status: He is alert.     ED Results / Procedures / Treatments   Labs (all labs ordered are listed, but only abnormal results are displayed) Labs Reviewed  RESP PANEL BY RT-PCR (RSV, FLU A&B, COVID)  [id number] - Abnormal;  Notable for the following components:      Result Value   Resp Syncytial Virus by PCR POSITIVE (*)    All other components within normal limits    EKG None  Radiology DG Chest 2 View Result Date: 01/23/2024 CLINICAL DATA:  Cough and wheezing. EXAM: CHEST - 2 VIEW COMPARISON:  11/30/2019 FINDINGS: The heart size and mediastinal contours are within normal limits. Both lungs are clear. The visualized skeletal structures are unremarkable. IMPRESSION: No active cardiopulmonary disease. Electronically Signed   By: Danae Orleans M.D.   On: 01/23/2024 17:48    Procedures Procedures    Medications Ordered in ED Medications  albuterol (VENTOLIN HFA) 108 (90 Base) MCG/ACT inhaler 2 puff (has no administration in time range)  AeroChamber Plus Flo-Vu Small device MISC 1 each (has no administration in time range)    ED Course/ Medical Decision Making/ A&P    Patient seen and examined. History obtained directly from patient.   Labs/EKG: Viral panel positive for RSV  Imaging: None ordered  Medications/Fluids: Ordered: Albuterol HFA.   Most recent vital signs reviewed and are as follows: BP (!) 125/90   Pulse 83   Temp 98.7 F (37.1 C) (Oral)   Resp 17   Wt 77.1 kg   SpO2 100%   BMI 28.29 kg/m   Initial impression: Cough from RSV infection  Plan: Discharge to home.   Prescriptions written for: Tessalon  Other home care instructions discussed: Rest, hydration, OTC meds, avoidance of triggers  ED return instructions discussed: Worsening shortness of breath, increased work of breathing, new or worsening symptoms  Follow-up instructions discussed: Patient encouraged to follow-up with their PCP in 3-5 days if not improving.                                 Medical Decision Making Amount and/or Complexity of Data Reviewed Radiology: ordered.  Risk Prescription drug management.   Patient with symptoms consistent with a viral syndrome. Vitals are stable, no fever. No signs of  dehydration. Lung exam normal, no signs of pneumonia. Supportive therapy indicated with return if symptoms worsen.    The patient's vital signs, pertinent lab work and imaging were reviewed and interpreted as discussed in the ED course. Hospitalization was considered for further testing, treatments, or serial exams/observation. However as patient is well-appearing, has a stable exam, and reassuring studies today, I do not feel that they warrant admission at this time. This plan was discussed with the patient who verbalizes agreement and comfort  with this plan and seems reliable and able to return to the Emergency Department with worsening or changing symptoms.          Final Clinical Impression(s) / ED Diagnoses Final diagnoses:  RSV infection  Acute cough    Rx / DC Orders ED Discharge Orders          Ordered    benzonatate (TESSALON) 100 MG capsule  Every 8 hours        01/23/24 2023              Renne Crigler, PA-C 01/23/24 2026    Glyn Ade, MD 01/23/24 2121

## 2024-01-23 NOTE — ED Triage Notes (Signed)
Cough since Tuesday. -N/-V/-D.   HX HTN.

## 2024-01-23 NOTE — Discharge Instructions (Addendum)
Please read and follow all provided instructions.  Your diagnoses today include:  1. RSV infection   2. Acute cough     Tests performed today include: Viral panel: positive for RSV Vital signs. See below for your results today.   Medications prescribed:  Albuterol inhaler - medication that opens up your airway  Use inhaler as follows: 1-2 puffs with spacer every 4 hours as needed for wheezing, cough, or shortness of breath.   Tessalon Perles - cough suppressant medication  Take any prescribed medications only as directed.  Home care instructions:  Follow any educational materials contained in this packet.  Follow-up instructions: Please follow-up with your primary care provider in the next 3 days for further evaluation of your symptoms and a recheck if you are not feeling better.   Return instructions:  Please return to the Emergency Department if you experience worsening symptoms. Please return with worsening wheezing, shortness of breath, or difficulty breathing. Return with persistent fever above 101F.  Please return if you have any other emergent concerns.  Additional Information:  Your vital signs today were: BP (!) 125/90   Pulse 83   Temp 98.7 F (37.1 C) (Oral)   Resp 17   Wt 77.1 kg   SpO2 100%   BMI 28.29 kg/m  If your blood pressure (BP) was elevated above 135/85 this visit, please have this repeated by your doctor within one month. --------------

## 2024-01-28 ENCOUNTER — Other Ambulatory Visit: Payer: Self-pay

## 2024-02-07 ENCOUNTER — Other Ambulatory Visit: Payer: Self-pay

## 2024-02-07 ENCOUNTER — Emergency Department (HOSPITAL_BASED_OUTPATIENT_CLINIC_OR_DEPARTMENT_OTHER)
Admission: EM | Admit: 2024-02-07 | Discharge: 2024-02-07 | Disposition: A | Discharge status: Home / Self Care | Payer: Medicaid Other | Attending: Emergency Medicine | Admitting: Emergency Medicine

## 2024-02-07 ENCOUNTER — Encounter (HOSPITAL_BASED_OUTPATIENT_CLINIC_OR_DEPARTMENT_OTHER): Payer: Self-pay

## 2024-02-07 ENCOUNTER — Emergency Department (HOSPITAL_BASED_OUTPATIENT_CLINIC_OR_DEPARTMENT_OTHER): Payer: Medicaid Other

## 2024-02-07 DIAGNOSIS — Z87891 Personal history of nicotine dependence: Secondary | ICD-10-CM | POA: Diagnosis not present

## 2024-02-07 DIAGNOSIS — J40 Bronchitis, not specified as acute or chronic: Secondary | ICD-10-CM | POA: Insufficient documentation

## 2024-02-07 DIAGNOSIS — Z91010 Allergy to peanuts: Secondary | ICD-10-CM | POA: Diagnosis not present

## 2024-02-07 DIAGNOSIS — R059 Cough, unspecified: Secondary | ICD-10-CM | POA: Diagnosis not present

## 2024-02-07 DIAGNOSIS — R062 Wheezing: Secondary | ICD-10-CM | POA: Diagnosis present

## 2024-02-07 MED ORDER — PREDNISONE 50 MG PO TABS
60.0000 mg | ORAL_TABLET | Freq: Once | ORAL | Status: AC
  Administered 2024-02-07: 60 mg via ORAL
  Filled 2024-02-07: qty 1

## 2024-02-07 MED ORDER — PREDNISONE 20 MG PO TABS: 60.0000 mg | ORAL_TABLET | Freq: Every day | ORAL | 0 refills | Status: AC

## 2024-02-07 MED ORDER — AZITHROMYCIN 250 MG PO TABS: 250.0000 mg | ORAL_TABLET | Freq: Every day | ORAL | 0 refills | Status: DC

## 2024-02-07 MED ORDER — ALBUTEROL SULFATE HFA 108 (90 BASE) MCG/ACT IN AERS
4.0000 | INHALATION_SPRAY | Freq: Once | RESPIRATORY_TRACT | Status: AC
  Administered 2024-02-07: 4 via RESPIRATORY_TRACT
  Filled 2024-02-07: qty 6.7

## 2024-02-07 NOTE — ED Provider Notes (Signed)
 [place] EMERGENCY DEPARTMENT AT [facility] Provider Note   CSN: [id number] Arrival date & time: 02/07/24  9291     History  Chief Complaint  Patient presents with   Wheezing    Jeremiah Curtis is a 43 y.o. male.  Patient here with ongoing cough wheezing.  History of smoking.  Diagnosed with RSV recently.  Denies any fever or chills.  Has been using inhaler with some help.  Denies any chest pain current shortness of breath sputum production nausea vomiting diarrhea abdominal pain.  The history is provided by the patient.       Home Medications Prior to Admission medications   Medication Sig Start Date End Date Taking? Authorizing Provider  azithromycin  (ZITHROMAX ) 250 MG tablet Take 1 tablet (250 mg total) by mouth daily. Take first 2 tablets together, then 1 every day until finished. 02/07/24  Yes Fedrick Cefalu, DO  predniSONE  (DELTASONE ) 20 MG tablet Take 3 tablets (60 mg total) by mouth daily for 4 days. 02/07/24 02/11/24 Yes Omah Dewalt, DO  amLODipine  (NORVASC ) 10 MG tablet Take 1 tablet (10 mg total) by mouth daily. 09/04/23   Fleming, Zelda W, NP  benzonatate  (TESSALON ) 100 MG capsule Take 1 capsule (100 mg total) by mouth every 8 (eight) hours. 01/23/24   Desiderio Chew, PA-C  chlorthalidone  (HYGROTON ) 25 MG tablet Take 1 tablet (25 mg total) by mouth daily. 09/04/23   Fleming, Zelda W, NP  cyclobenzaprine  (FLEXERIL ) 10 MG tablet Take 1 tablet (10 mg total) by mouth 3 (three) times daily as needed for muscle spasms. 09/04/23   Fleming, Zelda W, NP  EPINEPHrine  0.3 mg/0.3 mL IJ SOAJ injection Inject 0.3 mg into the muscle as needed for anaphylaxis. 09/16/23   Newlin, Enobong, MD  metoprolol  succinate (TOPROL -XL) 50 MG 24 hr tablet TAKE 1 TABLET (50 MG TOTAL) BY MOUTH DAILY. TAKE WITH OR IMMEDIATELY FOLLOWING A MEAL. 09/04/23   Fleming, Zelda W, NP  nitroGLYCERIN  (NITROSTAT ) 0.4 MG SL tablet PLACE 1 TABLET (0.4 MG TOTAL) UNDER THE TONGUE EVERY 5 (FIVE) MINUTES AS NEEDED  FOR CHEST PAIN. 03/20/21 09/04/23  Patwardhan, Newman PARAS, MD  omeprazole  (PRILOSEC) 20 MG capsule Take 1 capsule (20 mg total) by mouth 2 (two) times daily before a meal. 09/04/23   Fleming, Zelda W, NP  pantoprazole  (PROTONIX ) 20 MG tablet Take 1 tablet (20 mg total) by mouth daily. 12/14/23   Schutt, Marsa HERO, PA-C  rosuvastatin  (CRESTOR ) 10 MG tablet Take 1 tablet (10 mg total) by mouth daily. 09/04/23   Fleming, Zelda W, NP      Allergies    Fish allergy, Shellfish allergy, Lisinopril , Peanut (diagnostic), and Sesame seed (diagnostic)    Review of Systems   Review of Systems  Physical Exam Updated Vital Signs BP (!) 133/91 (BP Location: Right Arm)   Pulse 79   Temp (!) 97.3 F (36.3 C)   Resp 18   SpO2 100%  Physical Exam Vitals and nursing note reviewed.  Constitutional:      General: He is not in acute distress.    Appearance: He is well-developed. He is not ill-appearing.  HENT:     Head: Normocephalic and atraumatic.     Nose: Nose normal.     Mouth/Throat:     Mouth: Mucous membranes are moist.  Eyes:     Extraocular Movements: Extraocular movements intact.     Conjunctiva/sclera: Conjunctivae normal.     Pupils: Pupils are equal, round, and reactive to light.  Cardiovascular:  Rate and Rhythm: Normal rate and regular rhythm.     Pulses: Normal pulses.     Heart sounds: Normal heart sounds. No murmur heard. Pulmonary:     Effort: Pulmonary effort is normal. No respiratory distress.     Breath sounds: Normal breath sounds.  Abdominal:     Palpations: Abdomen is soft.     Tenderness: There is no abdominal tenderness.  Musculoskeletal:        General: No swelling.     Cervical back: Normal range of motion and neck supple.  Skin:    General: Skin is warm and dry.     Capillary Refill: Capillary refill takes less than 2 seconds.  Neurological:     Mental Status: He is alert.  Psychiatric:        Mood and Affect: Mood normal.     ED Results / Procedures /  Treatments   Labs (all labs ordered are listed, but only abnormal results are displayed) Labs Reviewed - No data to display  EKG None  Radiology DG Chest Portable 1 View Result Date: 02/07/2024 CLINICAL DATA:  cough EXAM: PORTABLE CHEST 1 VIEW COMPARISON:  Chest XR, 01/23/2024 and 11/30/2019. FINDINGS: Cardiomediastinal silhouette is within normal limits. Lungs are well inflated. No focal consolidation or mass. No pleural effusion or pneumothorax. No acute displaced fracture. IMPRESSION: No acute cardiopulmonary process. Electronically Signed   By: Thom Hall M.D.   On: 02/07/2024 07:57    Procedures Procedures    Medications Ordered in ED Medications  albuterol  (VENTOLIN  HFA) 108 (90 Base) MCG/ACT inhaler 4 puff (has no administration in time range)  predniSONE  (DELTASONE ) tablet 60 mg (60 mg Oral Given 02/07/24 0732)    ED Course/ Medical Decision Making/ A&P                                 Medical Decision Making Amount and/or Complexity of Data Reviewed Radiology: ordered.  Risk Prescription drug management.   Jeremiah Curtis is here with cough.  Normal vitals.  No fever.  Recent RSV.  History of smoking.  Differential diagnosis likely bronchitis versus pneumonia versus ongoing viral process.  I have no concern for PE or ACS.  Is not having any shortness of breath chest pain hypoxia.  No risk factors for either as well.  Chest x-ray was obtained that per my review interpretation shows no evidence of pneumonia or pneumothorax.  Radiology report with the same.  Patient given albuterol  prednisone  and overall think he has bronchitis.  Will give prednisone  Z-Pak continue supportive care albuterol  use.  Discharged patient.  Understands return precautions.  This chart was dictated using voice recognition software.  Despite best efforts to proofread,  errors can occur which can change the documentation meaning.         Final Clinical Impression(s) / ED Diagnoses Final  diagnoses:  Bronchitis    Rx / DC Orders ED Discharge Orders          Ordered    azithromycin  (ZITHROMAX ) 250 MG tablet  Daily        02/07/24 0812    predniSONE  (DELTASONE ) 20 MG tablet  Daily        02/07/24 0812              Ruthe Cornet, DO 02/07/24 [phone]

## 2024-02-07 NOTE — Discharge Instructions (Signed)
 Take Z-Pak when you get home this is your antibiotic.  Take your next dose of prednisone  tomorrow.  Use inhaler 2 to 4 puffs every 4-6 hours as needed.

## 2024-02-07 NOTE — ED Triage Notes (Signed)
 He c/o persistent wheezing since being dx with RSV ~ 3 weeks ago. He is in no distress and is breathing normally.

## 2024-02-26 ENCOUNTER — Other Ambulatory Visit: Payer: Self-pay

## 2024-02-26 ENCOUNTER — Other Ambulatory Visit: Payer: Self-pay | Admitting: Nurse Practitioner

## 2024-02-26 DIAGNOSIS — I10 Essential (primary) hypertension: Secondary | ICD-10-CM

## 2024-02-26 MED ORDER — AMLODIPINE BESYLATE 10 MG PO TABS
10.0000 mg | ORAL_TABLET | Freq: Every day | ORAL | 0 refills | Status: DC
  Filled 2024-02-26: qty 30, 30d supply, fill #0

## 2024-02-29 ENCOUNTER — Other Ambulatory Visit: Payer: Self-pay

## 2024-03-04 ENCOUNTER — Ambulatory Visit: Payer: Medicaid Other | Admitting: Nurse Practitioner

## 2024-03-07 ENCOUNTER — Ambulatory Visit: Admitting: Nurse Practitioner

## 2024-03-17 ENCOUNTER — Telehealth: Payer: Self-pay | Admitting: Nurse Practitioner

## 2024-03-17 NOTE — Telephone Encounter (Signed)
Contacted pt confirmed appt

## 2024-03-22 ENCOUNTER — Other Ambulatory Visit: Payer: Self-pay

## 2024-03-22 ENCOUNTER — Ambulatory Visit: Attending: Nurse Practitioner | Admitting: Nurse Practitioner

## 2024-03-22 ENCOUNTER — Other Ambulatory Visit: Payer: Self-pay | Admitting: Pharmacist

## 2024-03-22 ENCOUNTER — Encounter: Payer: Self-pay | Admitting: Nurse Practitioner

## 2024-03-22 VITALS — BP 129/79 | HR 80 | Resp 20 | Ht 65.0 in | Wt 172.2 lb

## 2024-03-22 DIAGNOSIS — I10 Essential (primary) hypertension: Secondary | ICD-10-CM | POA: Diagnosis not present

## 2024-03-22 DIAGNOSIS — R931 Abnormal findings on diagnostic imaging of heart and coronary circulation: Secondary | ICD-10-CM | POA: Diagnosis not present

## 2024-03-22 DIAGNOSIS — E876 Hypokalemia: Secondary | ICD-10-CM | POA: Diagnosis not present

## 2024-03-22 DIAGNOSIS — R7303 Prediabetes: Secondary | ICD-10-CM

## 2024-03-22 DIAGNOSIS — D693 Immune thrombocytopenic purpura: Secondary | ICD-10-CM

## 2024-03-22 DIAGNOSIS — J40 Bronchitis, not specified as acute or chronic: Secondary | ICD-10-CM | POA: Diagnosis not present

## 2024-03-22 DIAGNOSIS — K219 Gastro-esophageal reflux disease without esophagitis: Secondary | ICD-10-CM | POA: Diagnosis not present

## 2024-03-22 DIAGNOSIS — J302 Other seasonal allergic rhinitis: Secondary | ICD-10-CM

## 2024-03-22 MED ORDER — CHLORTHALIDONE 25 MG PO TABS
25.0000 mg | ORAL_TABLET | Freq: Every day | ORAL | 1 refills | Status: DC
Start: 2024-03-22 — End: 2024-10-03
  Filled 2024-03-22: qty 30, 30d supply, fill #0
  Filled 2024-04-29: qty 30, 30d supply, fill #1
  Filled 2024-05-30: qty 30, 30d supply, fill #2
  Filled 2024-06-28: qty 30, 30d supply, fill #3
  Filled 2024-08-01: qty 30, 30d supply, fill #4
  Filled 2024-08-26: qty 30, 30d supply, fill #5

## 2024-03-22 MED ORDER — AMLODIPINE BESYLATE 10 MG PO TABS
10.0000 mg | ORAL_TABLET | Freq: Every day | ORAL | 1 refills | Status: DC
  Filled 2024-03-22: qty 30, 30d supply, fill #0
  Filled 2024-04-29: qty 30, 30d supply, fill #1
  Filled 2024-05-30: qty 30, 30d supply, fill #2
  Filled 2024-06-28: qty 30, 30d supply, fill #3
  Filled 2024-08-01: qty 30, 30d supply, fill #4
  Filled 2024-08-26: qty 30, 30d supply, fill #5

## 2024-03-22 MED ORDER — ALBUTEROL SULFATE HFA 108 (90 BASE) MCG/ACT IN AERS
2.0000 | INHALATION_SPRAY | Freq: Four times a day (QID) | RESPIRATORY_TRACT | 2 refills | Status: AC | PRN
  Filled 2024-03-22 – 2024-03-28 (×2): qty 18, 25d supply, fill #0

## 2024-03-22 MED ORDER — IPRATROPIUM BROMIDE 0.03 % NA SOLN
2.0000 | Freq: Two times a day (BID) | NASAL | 12 refills | Status: AC
  Filled 2024-03-22: qty 30, 45d supply, fill #0
  Filled 2024-05-30: qty 30, 45d supply, fill #1
  Filled 2024-10-03 – 2024-12-26 (×3): qty 30, 45d supply, fill #2

## 2024-03-22 MED ORDER — PANTOPRAZOLE SODIUM 20 MG PO TBEC
20.0000 mg | DELAYED_RELEASE_TABLET | Freq: Every day | ORAL | 1 refills | Status: AC
  Filled 2024-03-22: qty 30, 30d supply, fill #0
  Filled 2024-10-03 – 2024-12-26 (×2): qty 30, 30d supply, fill #1

## 2024-03-22 MED ORDER — ALBUTEROL SULFATE HFA 108 (90 BASE) MCG/ACT IN AERS
2.0000 | INHALATION_SPRAY | Freq: Four times a day (QID) | RESPIRATORY_TRACT | 2 refills | Status: DC | PRN
  Filled 2024-03-22: qty 8.5, 30d supply, fill #0

## 2024-03-22 NOTE — Progress Notes (Signed)
 Assessment & Plan:  [name] was seen today for medical management of chronic issues.  Diagnoses and all orders for this visit:  Prediabetes -     Hemoglobin A1c Monitor carbohydrate intake. Exercise 3-5 times per week for at least 30 minutes.   Chronic ITP (idiopathic thrombocytopenia) (HCC) -     CBC with Differential  Hypokalemia -     CMP14+EGFR  Elevated coronary artery calcium score -     Lipid Panel  Primary hypertension -     amLODipine (NORVASC) 10 MG tablet; Take 1 tablet (10 mg total) by mouth daily. -     chlorthalidone (HYGROTON) 25 MG tablet; Take 1 tablet (25 mg total) by mouth daily. Check blood pressure at home.  Bronchitis -     ipratropium (ATROVENT) 0.03 % nasal spray; Place 2 sprays into both nostrils every 12 (twelve) hours. -     Discontinue: albuterol (VENTOLIN HFA) 108 (90 Base) MCG/ACT inhaler; Inhale 2 puffs into the lungs every 6 (six) hours as needed for wheezing or shortness of breath. Stay hydrated by drinking plenty of water.  Smoking cessation will help reduce risk of smoking related diseases.    Seasonal allergies Continue taking Cetirizine OTC 10 mg every day for seasonal allergies   GERD without esophagitis -     pantoprazole (PROTONIX) 20 MG tablet; Take 1 tablet (20 mg total) by mouth daily. FOR ACID REFLUX Reduce foods that cause acid reflux such as soda, caffeine, spicy foods  Patient has been counseled on age-appropriate routine health concerns for screening and prevention. These are reviewed and up-to-date. Referrals have been placed accordingly. Immunizations are up-to-date or declined.    Subjective:   Chief Complaint  Patient presents with   Medical Management of Chronic Issues    Jeremiah Curtis 43 y.o. male presents to office today for follow up on hypertension and seasonal allergies. States he was seen in emergency room on 01/23/2024 for RSV with symptoms of cough, congestion and malaise. He returned to the ED again on  02/07/2024 for bronchitis and was prescribed prednisone and abx.   Today he continues to experience symptoms of sneezing, and runny nose for which he has started taking Cetirizine 2 days ago as well as reduced the number of cigarettes he smokes daily. Reports smoking less than half a pack per day.  He is ready to quit, but feels he can quit without assistance of medications. Educated patient on benefits of smoking cessation program.  He has a history of right inguinal hernia which has been relatively stable. He was changing a car tire recently and has been experiencing mild pain in this area.  Rated pain 3/10. Denies bulging, tenderness or erythema at right groin area. Educated on symptoms to report for incarcerated hernia such as severe pain, fever, nausea, vomiting, increased swelling, tenderness and difficulty urinating or defecating. Take Tylenol as needed for pain. Educational material provided on AVS on inguinal hernia.     Review of Systems  Constitutional: Negative.  Negative for chills and malaise/fatigue.  HENT:  Positive for congestion.   Eyes: Negative.   Respiratory:  Positive for wheezing.   Cardiovascular: Negative.  Negative for chest pain.  Gastrointestinal: Negative.   Genitourinary:        Right groin pain  Musculoskeletal: Negative.   Skin: Negative.   Neurological: Negative.   Endo/Heme/Allergies: Negative.   Psychiatric/Behavioral: Negative.     Past Medical History:  Diagnosis Date   Back symptoms, other    Chronic  back pain    High cholesterol    Hypertension    Idiopathic thrombocytopenic purpura (ITP) (HCC)     Past Surgical History:  Procedure Laterality Date   BACK SURGERY     LEG SURGERY      Family History  Problem Relation Age of Onset   Hypertension Mother    Cancer Mother    Hypertension Father    Diabetes Maternal Grandmother    Diabetes Paternal Grandmother     Social History Reviewed with no changes to be made today.   Outpatient  Medications Prior to Visit  Medication Sig Dispense Refill   EPINEPHrine 0.3 mg/0.3 mL IJ SOAJ injection Inject 0.3 mg into the muscle as needed for anaphylaxis. 2 each 2   metoprolol succinate (TOPROL-XL) 50 MG 24 hr tablet TAKE 1 TABLET (50 MG TOTAL) BY MOUTH DAILY. TAKE WITH OR IMMEDIATELY FOLLOWING A MEAL. 90 tablet 3   rosuvastatin (CRESTOR) 10 MG tablet Take 1 tablet (10 mg total) by mouth daily. 90 tablet 3   amLODipine (NORVASC) 10 MG tablet Take 1 tablet (10 mg total) by mouth daily. 30 tablet 0   chlorthalidone (HYGROTON) 25 MG tablet Take 1 tablet (25 mg total) by mouth daily. 90 tablet 1   cyclobenzaprine (FLEXERIL) 10 MG tablet Take 1 tablet (10 mg total) by mouth 3 (three) times daily as needed for muscle spasms. (Patient not taking: Reported on 03/22/2024) 10 tablet 0   nitroGLYCERIN (NITROSTAT) 0.4 MG SL tablet PLACE 1 TABLET (0.4 MG TOTAL) UNDER THE TONGUE EVERY 5 (FIVE) MINUTES AS NEEDED FOR CHEST PAIN. (Patient not taking: Reported on 03/22/2024) 25 tablet 3   azithromycin (ZITHROMAX) 250 MG tablet Take 1 tablet (250 mg total) by mouth daily. Take first 2 tablets together, then 1 every day until finished. 6 tablet 0   benzonatate (TESSALON) 100 MG capsule Take 1 capsule (100 mg total) by mouth every 8 (eight) hours. 15 capsule 0   omeprazole (PRILOSEC) 20 MG capsule Take 1 capsule (20 mg total) by mouth 2 (two) times daily before a meal. 180 capsule 1   pantoprazole (PROTONIX) 20 MG tablet Take 1 tablet (20 mg total) by mouth daily. (Patient not taking: Reported on 03/22/2024) 30 tablet 0   No facility-administered medications prior to visit.    Allergies  Allergen Reactions   Fish Allergy Anaphylaxis   Shellfish Allergy Anaphylaxis   Lisinopril     Blurred vision   Peanut (Diagnostic) Hives   Sesame Seed (Diagnostic) Hives and Itching       Objective:    BP 129/79 (BP Location: Left Arm, Patient Position: Sitting, Cuff Size: Normal)   Pulse 80   Resp 20   Ht 5\' 5"   (1.651 m)   Wt 172 lb 3.2 oz (78.1 kg)   SpO2 99%   BMI 28.66 kg/m  Wt Readings from Last 3 Encounters:  03/22/24 172 lb 3.2 oz (78.1 kg)  01/23/24 170 lb (77.1 kg)  12/14/23 178 lb (80.7 kg)   BP Readings from Last 3 Encounters:  03/22/24 129/79  02/07/24 (!) 133/91  01/23/24 (!) 125/90    Physical Exam Vitals and nursing note reviewed.  Constitutional:      General: He is not in acute distress.    Appearance: Normal appearance.  HENT:     Head: Normocephalic.     Right Ear: External ear normal.     Left Ear: External ear normal.     Nose: Congestion and rhinorrhea present.  Cardiovascular:  Rate and Rhythm: Normal rate and regular rhythm.     Heart sounds: Normal heart sounds.  Pulmonary:     Effort: Pulmonary effort is normal.     Breath sounds: Examination of the right-lower field reveals wheezing. Examination of the left-lower field reveals wheezing. Wheezing present.  Abdominal:     Hernia: A hernia is present. Hernia is present in the right inguinal area.    Musculoskeletal:        General: Normal range of motion.     Cervical back: Normal range of motion.  Skin:    General: Skin is warm and dry.  Neurological:     General: No focal deficit present.     Mental Status: He is alert and oriented to person, place, and time.  Psychiatric:        Attention and Perception: Attention normal.        Mood and Affect: Mood normal.        Speech: Speech normal.        Behavior: Behavior normal.        Thought Content: Thought content normal.        Cognition and Memory: Cognition normal.        Judgment: Judgment normal.        Patient has been counseled extensively about nutrition and exercise as well as the importance of adherence with medications and regular follow-up. The patient was given clear instructions to go to ER or return to medical center if symptoms don't improve, worsen or new problems develop. The patient verbalized understanding.   Follow-up:  Return in about 3 months (around 06/22/2024).   Joette Catching, BSN RN, student FNP [facility] [place], [place] [phone]   03/22/2024, 10:36 AM

## 2024-03-22 NOTE — Progress Notes (Signed)
 I have seen and examined this patient with the advanced practice provider student and agree with the note below

## 2024-03-23 LAB — CBC WITH DIFFERENTIAL/PLATELET
Basophils Absolute: 0 10*3/uL (ref 0.0–0.2)
Basos: 1 %
EOS (ABSOLUTE): 0.2 10*3/uL (ref 0.0–0.4)
Eos: 3 %
Hematocrit: 44.3 % (ref 37.5–51.0)
Hemoglobin: 14.6 g/dL (ref 13.0–17.7)
Immature Grans (Abs): 0 10*3/uL (ref 0.0–0.1)
Immature Granulocytes: 0 %
Lymphocytes Absolute: 2.5 10*3/uL (ref 0.7–3.1)
Lymphs: 31 %
MCH: 29.1 pg (ref 26.6–33.0)
MCHC: 33 g/dL (ref 31.5–35.7)
MCV: 88 fL (ref 79–97)
Monocytes Absolute: 0.5 10*3/uL (ref 0.1–0.9)
Monocytes: 6 %
Neutrophils Absolute: 4.6 10*3/uL (ref 1.4–7.0)
Neutrophils: 59 %
Platelets: 82 10*3/uL — CL (ref 150–450)
RBC: 5.02 x10E6/uL (ref 4.14–5.80)
RDW: 13.1 % (ref 11.6–15.4)
WBC: 7.8 10*3/uL (ref 3.4–10.8)

## 2024-03-23 LAB — CMP14+EGFR
ALT: 18 IU/L (ref 0–44)
AST: 18 IU/L (ref 0–40)
Albumin: 4.5 g/dL (ref 4.1–5.1)
Alkaline Phosphatase: 57 IU/L (ref 44–121)
BUN/Creatinine Ratio: 22 — ABNORMAL HIGH (ref 9–20)
BUN: 25 mg/dL — ABNORMAL HIGH (ref 6–24)
Bilirubin Total: 0.3 mg/dL (ref 0.0–1.2)
CO2: 24 mmol/L (ref 20–29)
Calcium: 9.7 mg/dL (ref 8.7–10.2)
Chloride: 100 mmol/L (ref 96–106)
Creatinine, Ser: 1.16 mg/dL (ref 0.76–1.27)
Globulin, Total: 2.4 g/dL (ref 1.5–4.5)
Glucose: 91 mg/dL (ref 70–99)
Potassium: 3.9 mmol/L (ref 3.5–5.2)
Sodium: 140 mmol/L (ref 134–144)
Total Protein: 6.9 g/dL (ref 6.0–8.5)
eGFR: 81 mL/min/{1.73_m2} (ref >59)

## 2024-03-23 LAB — HEMOGLOBIN A1C
Est. average glucose Bld gHb Est-mCnc: 123 mg/dL
Hgb A1c MFr Bld: 5.9 % — ABNORMAL HIGH (ref 4.8–5.6)

## 2024-03-23 LAB — LIPID PANEL
Chol/HDL Ratio: 2.3 ratio (ref 0.0–5.0)
Cholesterol, Total: 94 mg/dL — ABNORMAL LOW (ref 100–199)
HDL: 41 mg/dL (ref >39)
LDL Chol Calc (NIH): 41 mg/dL (ref 0–99)
Triglycerides: 50 mg/dL (ref 0–149)
VLDL Cholesterol Cal: 12 mg/dL (ref 5–40)

## 2024-03-25 ENCOUNTER — Encounter: Payer: Self-pay | Admitting: Nurse Practitioner

## 2024-03-28 ENCOUNTER — Other Ambulatory Visit: Payer: Self-pay

## 2024-04-29 ENCOUNTER — Other Ambulatory Visit: Payer: Self-pay

## 2024-05-09 ENCOUNTER — Encounter (HOSPITAL_BASED_OUTPATIENT_CLINIC_OR_DEPARTMENT_OTHER): Payer: Self-pay

## 2024-05-09 ENCOUNTER — Other Ambulatory Visit: Payer: Self-pay

## 2024-05-09 ENCOUNTER — Emergency Department (HOSPITAL_BASED_OUTPATIENT_CLINIC_OR_DEPARTMENT_OTHER)
Admission: EM | Admit: 2024-05-09 | Discharge: 2024-05-09 | Disposition: A | Discharge status: Home / Self Care | Attending: Emergency Medicine | Admitting: Emergency Medicine

## 2024-05-09 ENCOUNTER — Emergency Department (HOSPITAL_BASED_OUTPATIENT_CLINIC_OR_DEPARTMENT_OTHER): Admitting: Radiology

## 2024-05-09 DIAGNOSIS — R0789 Other chest pain: Secondary | ICD-10-CM | POA: Diagnosis not present

## 2024-05-09 DIAGNOSIS — K219 Gastro-esophageal reflux disease without esophagitis: Secondary | ICD-10-CM | POA: Diagnosis not present

## 2024-05-09 DIAGNOSIS — R1013 Epigastric pain: Secondary | ICD-10-CM

## 2024-05-09 DIAGNOSIS — Z91010 Allergy to peanuts: Secondary | ICD-10-CM | POA: Insufficient documentation

## 2024-05-09 DIAGNOSIS — Z79899 Other long term (current) drug therapy: Secondary | ICD-10-CM | POA: Diagnosis not present

## 2024-05-09 DIAGNOSIS — R1010 Upper abdominal pain, unspecified: Secondary | ICD-10-CM | POA: Diagnosis present

## 2024-05-09 DIAGNOSIS — R079 Chest pain, unspecified: Secondary | ICD-10-CM | POA: Diagnosis not present

## 2024-05-09 DIAGNOSIS — I10 Essential (primary) hypertension: Secondary | ICD-10-CM | POA: Insufficient documentation

## 2024-05-09 LAB — COMPREHENSIVE METABOLIC PANEL WITH GFR
ALT: 19 U/L (ref 0–44)
AST: 23 U/L (ref 15–41)
Albumin: 4.6 g/dL (ref 3.5–5.0)
Alkaline Phosphatase: 53 U/L (ref 38–126)
Anion gap: 11 (ref 5–15)
BUN: 21 mg/dL — ABNORMAL HIGH (ref 6–20)
CO2: 27 mmol/L (ref 22–32)
Calcium: 9.9 mg/dL (ref 8.9–10.3)
Chloride: 102 mmol/L (ref 98–111)
Creatinine, Ser: 1.09 mg/dL (ref 0.61–1.24)
GFR, Estimated: >60 mL/min (ref >60)
Glucose, Bld: 91 mg/dL (ref 70–99)
Potassium: 3.6 mmol/L (ref 3.5–5.1)
Sodium: 140 mmol/L (ref 135–145)
Total Bilirubin: 0.3 mg/dL (ref 0.0–1.2)
Total Protein: 7.1 g/dL (ref 6.5–8.1)

## 2024-05-09 LAB — CBC
HCT: 44.4 % (ref 39.0–52.0)
Hemoglobin: 14.8 g/dL (ref 13.0–17.0)
MCH: 29.2 pg (ref 26.0–34.0)
MCHC: 33.3 g/dL (ref 30.0–36.0)
MCV: 87.6 fL (ref 80.0–100.0)
Platelets: 94 10*3/uL — ABNORMAL LOW (ref 150–400)
RBC: 5.07 MIL/uL (ref 4.22–5.81)
RDW: 14.1 % (ref 11.5–15.5)
WBC: 7.4 10*3/uL (ref 4.0–10.5)
nRBC: 0 % (ref 0.0–0.2)

## 2024-05-09 LAB — TROPONIN T, HIGH SENSITIVITY: Troponin T High Sensitivity: <15 ng/L (ref <19)

## 2024-05-09 LAB — LIPASE, BLOOD: Lipase: 22 U/L (ref 11–51)

## 2024-05-09 MED ORDER — FAMOTIDINE 20 MG PO TABS
20.0000 mg | ORAL_TABLET | Freq: Once | ORAL | Status: AC
  Administered 2024-05-09: 20 mg via ORAL
  Filled 2024-05-09: qty 1

## 2024-05-09 MED ORDER — ALUM & MAG HYDROXIDE-SIMETH 200-200-20 MG/5ML PO SUSP
30.0000 mL | Freq: Once | ORAL | Status: AC
  Administered 2024-05-09: 30 mL via ORAL
  Filled 2024-05-09: qty 30

## 2024-05-09 MED ORDER — HYDROMORPHONE HCL 1 MG/ML IJ SOLN: 1.0000 mg | Freq: Once | INTRAMUSCULAR | Status: DC

## 2024-05-09 MED ORDER — DEXAMETHASONE SODIUM PHOSPHATE 10 MG/ML IJ SOLN
8.0000 mg | Freq: Once | INTRAMUSCULAR | Status: DC
Start: 2024-05-09 — End: 2024-05-09

## 2024-05-09 MED ORDER — FAMOTIDINE 40 MG PO TABS: 40.0000 mg | ORAL_TABLET | Freq: Every day | ORAL | 0 refills | Status: DC

## 2024-05-09 NOTE — Discharge Instructions (Signed)
 As discussed, your labs and imaging are reassuring.  Your symptoms were most likely related to acid reflux.  Take famotidine daily to help with your symptoms.  Up with your primary care provider in the next week for reevaluation.  Get help right away if: You have pain all of a sudden in your: Arm. Neck. Jaw. Teeth. Back. You feel sweaty, dizzy, or light-headed all of a sudden. You faint. You have chest pain or shortness of breath. You vomit and the vomit is: Green, yellow, or black. Looks like blood or coffee grounds. Your poop is red, bloody, or black.

## 2024-05-09 NOTE — ED Provider Triage Note (Signed)
 Emergency Medicine Provider Triage Evaluation Note  Aydan Zagorski , a 43 y.o. male  was evaluated in triage.  Pt complains of upper abdominal pain, chest pain.  States that he woke this morning around 530 with the symptoms.  They persisted throughout the day.  States he has a history of heartburn usually experiences a burning type sensation.  Did eat [place] food last night.  Denies any fever, nausea, vomit, urinary symptoms, change in bowel habits, shortness of breath.   Review of Systems  Positive: See above Negative:   Physical Exam  BP 112/75   Pulse 82   Temp 98.4 F (36.9 C)   Resp 16   SpO2 99%  Gen:   Awake, no distress   Resp:  Normal effort  MSK:   Moves extremities without difficulty  Other:  Epigastric tenderness  Medical Decision Making  Medically screening exam initiated at 4:00 PM.  Appropriate orders placed.  Selig Steuer was informed that the remainder of the evaluation will be completed by another provider, this initial triage assessment does not replace that evaluation, and the importance of remaining in the ED until their evaluation is complete.     Murtaugh Butter, [place] 05/09/24 [phone]

## 2024-05-09 NOTE — ED Triage Notes (Signed)
 Pt c/o stomach & CP onset approx 0530, "like when you hold your pee for a long time." Pressure-like pain epigastric, straight up behind sternum. "When I breathe I can feel it." Denies SHOB, [place].

## 2024-05-09 NOTE — ED Provider Notes (Addendum)
 [place] [facility] DEPARTMENT AT [facility] Provider Note   CSN: [id number] Arrival date & time: 05/09/24  1541     History  Chief Complaint  Patient presents with   Abdominal Pain   Chest Pain    Jeremiah Curtis is a 43 y.o. male with a history of hypertension, chronic back pain, and GERD presents the ED today for chest and abdominal pain.  Patient reports waking up this morning with pain in his upper abdomen and sternum.  Patient reports that last night he was eating [place] food with jalapenos but denies any recent alcohol use or chronic NSAID use.  No nausea or vomiting.  No changes to urinary or bowel habits.  No fevers.  He denies any pain in the chest at this time.  No shortness of breath.  No additional complaints or concerns at this time.    Home Medications Prior to Admission medications   Medication Sig Start Date End Date Taking? Authorizing Provider  famotidine (PEPCID) 40 MG tablet Take 1 tablet (40 mg total) by mouth daily. 05/09/24 06/08/24 Yes Sonnie Dusky, PA-C  albuterol  (VENTOLIN  HFA) 108 (90 Base) MCG/ACT inhaler Inhale 2 puffs into the lungs every 6 (six) hours as needed for wheezing or shortness of breath. 03/22/24   Newlin, Enobong, MD  amLODipine  (NORVASC ) 10 MG tablet Take 1 tablet (10 mg total) by mouth daily. 03/22/24   Fleming, Zelda W, NP  chlorthalidone  (HYGROTON ) 25 MG tablet Take 1 tablet (25 mg total) by mouth daily. 03/22/24   Fleming, Zelda W, NP  cyclobenzaprine  (FLEXERIL ) 10 MG tablet Take 1 tablet (10 mg total) by mouth 3 (three) times daily as needed for muscle spasms. Patient not taking: Reported on 03/22/2024 09/04/23   Fleming, Zelda W, NP  EPINEPHrine  0.3 mg/0.3 mL IJ SOAJ injection Inject 0.3 mg into the muscle as needed for anaphylaxis. 09/16/23   Newlin, Enobong, MD  ipratropium (ATROVENT ) 0.03 % nasal spray Place 2 sprays into both nostrils every 12 (twelve) hours. 03/22/24   Fleming, Zelda W, NP  metoprolol  succinate (TOPROL -XL) 50  MG 24 hr tablet TAKE 1 TABLET (50 MG TOTAL) BY MOUTH DAILY. TAKE WITH OR IMMEDIATELY FOLLOWING A MEAL. 09/04/23   Fleming, Zelda W, NP  nitroGLYCERIN  (NITROSTAT ) 0.4 MG SL tablet PLACE 1 TABLET (0.4 MG TOTAL) UNDER THE TONGUE EVERY 5 (FIVE) MINUTES AS NEEDED FOR CHEST PAIN. Patient not taking: Reported on 03/22/2024 03/20/21 09/04/23  Cody Das, MD  pantoprazole  (PROTONIX ) 20 MG tablet Take 1 tablet (20 mg total) by mouth daily. FOR ACID REFLUX 03/22/24   Fleming, Zelda W, NP  rosuvastatin  (CRESTOR ) 10 MG tablet Take 1 tablet (10 mg total) by mouth daily. 09/04/23   Fleming, Zelda W, NP      Allergies    Fish allergy, Shellfish allergy, Lisinopril , Peanut (diagnostic), and Sesame seed (diagnostic)    Review of Systems   Review of Systems  Gastrointestinal:  Positive for abdominal pain.  All other systems reviewed and are negative.   Physical Exam Updated Vital Signs BP 124/88 (BP Location: Left Arm)   Pulse 63   Temp 98.3 F (36.8 C) (Oral)   Resp 18   SpO2 99%  Physical Exam Vitals and nursing note reviewed.  Constitutional:      General: He is not in acute distress.    Appearance: Normal appearance.  HENT:     Head: Normocephalic and atraumatic.     Mouth/Throat:     Mouth: Mucous membranes are moist.  Eyes:     Conjunctiva/sclera: Conjunctivae normal.     Pupils: Pupils are equal, round, and reactive to light.  Cardiovascular:     Rate and Rhythm: Normal rate and regular rhythm.     Pulses: Normal pulses.     Heart sounds: Normal heart sounds.  Pulmonary:     Effort: Pulmonary effort is normal.     Breath sounds: Normal breath sounds.  Chest:     Chest wall: No tenderness.  Abdominal:     Palpations: Abdomen is soft.     Tenderness: There is abdominal tenderness.     Comments: Tenderness to palpation of the epigastrium  Musculoskeletal:        General: Normal range of motion.     Cervical back: Normal range of motion.  Skin:    General: Skin is warm and dry.      Findings: No rash.  Neurological:     General: No focal deficit present.     Mental Status: He is alert.  Psychiatric:        Mood and Affect: Mood normal.        Behavior: Behavior normal.     ED Results / Procedures / Treatments   Labs (all labs ordered are listed, but only abnormal results are displayed) Labs Reviewed  COMPREHENSIVE METABOLIC PANEL WITH GFR - Abnormal; Notable for the following components:      Result Value   BUN 21 (*)    All other components within normal limits  CBC - Abnormal; Notable for the following components:   Platelets 94 (*)    All other components within normal limits  LIPASE, BLOOD  URINALYSIS, ROUTINE W REFLEX MICROSCOPIC  TROPONIN T, HIGH SENSITIVITY    EKG EKG Interpretation Date/Time:  Monday May 09 2024 15:54:11 EDT Ventricular Rate:  81 PR Interval:  132 QRS Duration:  108 QT Interval:  342 QTC Calculation: 397 R Axis:   82  Text Interpretation: Normal sinus rhythm When compared with ECG of 30-Nov-2019 11:30, No significant change was found No significant changes, t wave morphology noted in 2020 tracing inverted in inferior leads Confirmed by Jerald Molly [phone]) on 05/09/2024 3:55:28 PM  Radiology DG Chest 2 View Result Date: 05/09/2024 CLINICAL DATA:  Chest pain EXAM: CHEST - 2 VIEW COMPARISON:  None Available. FINDINGS: The heart size and mediastinal contours are within normal limits. Both lungs are clear. The visualized skeletal structures are unremarkable. IMPRESSION: No active cardiopulmonary disease. Electronically Signed   By: Reagan Camera M.D.   On: 05/09/2024 17:22    Procedures Procedures    Medications Ordered in ED Medications  alum & mag hydroxide-simeth (MAALOX/MYLANTA) 200-200-20 MG/5ML suspension 30 mL (30 mLs Oral Given 05/09/24 1945)  famotidine (PEPCID) tablet 20 mg (20 mg Oral Given 05/09/24 1944)    ED Course/ Medical Decision Making/ A&P                                 Medical Decision  Making Amount and/or Complexity of Data Reviewed Labs: ordered.  Risk Prescription drug management.   This patient presents to the ED for concern of chest and abdominal pain, this involves an extensive number of treatment options, and is a complaint that carries with it a high risk of complications and morbidity.   Differential diagnosis includes: ACS, costochondritis, pleurisy, gastritis, gastroenteritis, PUD, pancreatitis, IBS, IBD, etc.   Comorbidities  See HPI above   Additional  History  Additional history obtained from prior records   Cardiac Monitoring / EKG  The patient was maintained on a cardiac monitor.  I personally viewed and interpreted the cardiac monitored which showed: NSR with a heart rate of 81 bpm.   Lab Tests  I ordered and personally interpreted labs.  The pertinent results include:  ' Troponin less than 15 BUN of 21 and CMP otherwise within normal limits CBC is reassuring Lipase is unremarkable   Imaging Studies  I ordered imaging studies including CXR  I independently visualized and interpreted imaging which showed:  No acute cardiopulmonary disease I agree with the radiologist interpretation   Problem List / ED Course / Critical Interventions / Medication Management  Patient reports waking up with epigastric and lower chest pain located at the sternum.  Reports history of GERD but has not been taking anything for it recently.  States that he ate some spicy food last night prior to onset of symptoms.  Denies any chest pain at time of my evaluation.  States that his pain is only in the epigastrium.  Denies any nausea, vomiting, diarrhea, constipation, or changes to urination. No recent alcohol use or chronic NSAID use.  Denies any shortness of breath. Maalox and famotidine ordered in triage I ordered oral hydration for patient's elevated BUN Reevaluation of the patient after these medicines showed that the patient improved I have reviewed the  patients home medicines and have made adjustments as needed   Social Determinants of Health  Tobacco use   Test / Admission - Considered  Discussed finds with patient.  All questions were answered. He is hemodynamically stable and safe discharge home. Return precautions given.       Final Clinical Impression(s) / ED Diagnoses Final diagnoses:  Gastroesophageal reflux disease without esophagitis  Epigastric abdominal pain    Rx / DC Orders ED Discharge Orders          Ordered    famotidine (PEPCID) 40 MG tablet  Daily        05/09/24 2045              Sonnie Dusky, PA-C 05/09/24 2047    Sonnie Dusky, PA-C 05/09/24 2106    Arvilla Birmingham, MD 05/09/24 2303

## 2024-05-09 NOTE — ED Notes (Signed)
Left without papers.

## 2024-05-30 ENCOUNTER — Other Ambulatory Visit: Payer: Self-pay

## 2024-06-21 ENCOUNTER — Other Ambulatory Visit (HOSPITAL_BASED_OUTPATIENT_CLINIC_OR_DEPARTMENT_OTHER): Payer: Self-pay

## 2024-06-21 ENCOUNTER — Encounter (HOSPITAL_BASED_OUTPATIENT_CLINIC_OR_DEPARTMENT_OTHER): Payer: Self-pay

## 2024-06-21 ENCOUNTER — Other Ambulatory Visit: Payer: Self-pay

## 2024-06-21 ENCOUNTER — Emergency Department (HOSPITAL_BASED_OUTPATIENT_CLINIC_OR_DEPARTMENT_OTHER)
Admission: EM | Admit: 2024-06-21 | Discharge: 2024-06-21 | Disposition: A | Discharge status: Home / Self Care | Attending: Emergency Medicine | Admitting: Emergency Medicine

## 2024-06-21 DIAGNOSIS — M5416 Radiculopathy, lumbar region: Secondary | ICD-10-CM | POA: Insufficient documentation

## 2024-06-21 DIAGNOSIS — Z79899 Other long term (current) drug therapy: Secondary | ICD-10-CM | POA: Insufficient documentation

## 2024-06-21 DIAGNOSIS — Z91010 Allergy to peanuts: Secondary | ICD-10-CM | POA: Insufficient documentation

## 2024-06-21 DIAGNOSIS — M545 Low back pain, unspecified: Secondary | ICD-10-CM | POA: Diagnosis present

## 2024-06-21 DIAGNOSIS — I10 Essential (primary) hypertension: Secondary | ICD-10-CM | POA: Insufficient documentation

## 2024-06-21 MED ORDER — METHYLPREDNISOLONE 4 MG PO TBPK
ORAL_TABLET | ORAL | 0 refills | Status: DC
  Filled 2024-06-21: qty 21, 6d supply, fill #0

## 2024-06-21 MED ORDER — KETOROLAC TROMETHAMINE 60 MG/2ML IM SOLN
30.0000 mg | Freq: Once | INTRAMUSCULAR | Status: AC
  Administered 2024-06-21: 30 mg via INTRAMUSCULAR

## 2024-06-21 MED ORDER — CYCLOBENZAPRINE HCL 10 MG PO TABS
10.0000 mg | ORAL_TABLET | Freq: Two times a day (BID) | ORAL | 0 refills | Status: DC | PRN
  Filled 2024-06-21: qty 20, 10d supply, fill #0

## 2024-06-21 MED ORDER — CYCLOBENZAPRINE HCL 10 MG PO TABS
10.0000 mg | ORAL_TABLET | Freq: Once | ORAL | Status: AC
  Administered 2024-06-21: 10 mg via ORAL
  Filled 2024-06-21: qty 1

## 2024-06-21 MED ORDER — KETOROLAC TROMETHAMINE 30 MG/ML IJ SOLN
30.0000 mg | Freq: Once | INTRAMUSCULAR | Status: DC
  Filled 2024-06-21: qty 1

## 2024-06-21 MED ORDER — NAPROXEN 500 MG PO TABS
500.0000 mg | ORAL_TABLET | Freq: Two times a day (BID) | ORAL | 0 refills | Status: DC
  Filled 2024-06-21: qty 20, 10d supply, fill #0

## 2024-06-21 NOTE — ED Triage Notes (Signed)
 Back in lower back onset 2 days.  Pain going down left leg.  Ambulatory to room

## 2024-06-21 NOTE — ED Provider Notes (Signed)
 [place] EMERGENCY DEPARTMENT AT [facility] Provider Note   CSN: [id number] Arrival date & time: 06/21/24  9141     Patient presents with: Back Pain   Jeremiah Curtis is a 43 y.o. male.   Patient is a 43 year old male with a history of hypertension, prior ITP, chronic back pain status post surgery in 2012 who is presenting today with complaint of back pain that radiates down his left leg and started yesterday after he was locking up for work.  It makes him feel like he has some mild weakness in his left leg but denies any bowel or bladder symptoms.  He has no abdominal pain or fevers.  No recent back intervention and reports he has never used IV drugs.  He reports this feels very similar to prior symptoms but he did not have any more meds at home.  He has been compliant with his blood pressure medications.  The history is provided by the patient and the spouse.  Back Pain      Prior to Admission medications   Medication Sig Start Date End Date Taking? Authorizing Provider  cyclobenzaprine  (FLEXERIL ) 10 MG tablet Take 1 tablet (10 mg total) by mouth 2 (two) times daily as needed for muscle spasms. 06/21/24  Yes Malina Geers, Benton, MD  methylPREDNISolone  (MEDROL  DOSEPAK) 4 MG TBPK tablet Take according to package instructions 06/21/24  Yes Doretha Benton, MD  naproxen  (NAPROSYN ) 500 MG tablet Take 1 tablet (500 mg total) by mouth 2 (two) times daily. 06/21/24  Yes Doretha Benton, MD  albuterol  (VENTOLIN  HFA) 108 (90 Base) MCG/ACT inhaler Inhale 2 puffs into the lungs every 6 (six) hours as needed for wheezing or shortness of breath. 03/22/24   Newlin, Enobong, MD  amLODipine  (NORVASC ) 10 MG tablet Take 1 tablet (10 mg total) by mouth daily. 03/22/24   Fleming, Zelda W, NP  chlorthalidone  (HYGROTON ) 25 MG tablet Take 1 tablet (25 mg total) by mouth daily. 03/22/24   Fleming, Zelda W, NP  EPINEPHrine  0.3 mg/0.3 mL IJ SOAJ injection Inject 0.3 mg into the muscle as needed for  anaphylaxis. 09/16/23   Newlin, Enobong, MD  famotidine  (PEPCID ) 40 MG tablet Take 1 tablet (40 mg total) by mouth daily. 05/09/24 06/08/24  Waddell Sluder, PA-C  ipratropium (ATROVENT ) 0.03 % nasal spray Place 2 sprays into both nostrils every 12 (twelve) hours. 03/22/24   Fleming, Zelda W, NP  metoprolol  succinate (TOPROL -XL) 50 MG 24 hr tablet TAKE 1 TABLET (50 MG TOTAL) BY MOUTH DAILY. TAKE WITH OR IMMEDIATELY FOLLOWING A MEAL. 09/04/23   Fleming, Zelda W, NP  nitroGLYCERIN  (NITROSTAT ) 0.4 MG SL tablet PLACE 1 TABLET (0.4 MG TOTAL) UNDER THE TONGUE EVERY 5 (FIVE) MINUTES AS NEEDED FOR CHEST PAIN. Patient not taking: Reported on 03/22/2024 03/20/21 09/04/23  Elmira Newman PARAS, MD  pantoprazole  (PROTONIX ) 20 MG tablet Take 1 tablet (20 mg total) by mouth daily. FOR ACID REFLUX 03/22/24   Fleming, Zelda W, NP  rosuvastatin  (CRESTOR ) 10 MG tablet Take 1 tablet (10 mg total) by mouth daily. 09/04/23   Fleming, Zelda W, NP    Allergies: Fish allergy, Shellfish allergy, Lisinopril , Peanut (diagnostic), and Sesame seed (diagnostic)    Review of Systems  Musculoskeletal:  Positive for back pain.    Updated Vital Signs BP 125/86 (BP Location: Right Arm)   Pulse 80   Temp 98.8 F (37.1 C) (Oral)   Resp 16   Ht 5' 5 (1.651 m)   Wt 79.4 kg   SpO2 100%  BMI 29.12 kg/m   Physical Exam Vitals and nursing note reviewed.   Cardiovascular:     Pulses: Normal pulses.   Musculoskeletal:     Lumbar back: Tenderness and bony tenderness present.       Back:   Skin:    General: Skin is warm.   Neurological:     Mental Status: He is alert. Mental status is at baseline.     Comments: Reflexes intact.  Minimal weakness with plantar flexion of the left foot.  Normal left hamstring, quad and calf muscles.  Neg straight leg raise bilaterally  Psychiatric:        Mood and Affect: Mood normal.     (all labs ordered are listed, but only abnormal results are displayed) Labs Reviewed - No data to  display  EKG: None  Radiology: No results found.   Procedures   Medications Ordered in the ED  cyclobenzaprine  (FLEXERIL ) tablet 10 mg (10 mg Oral Given 06/21/24 0921)  ketorolac  (TORADOL ) injection 30 mg (30 mg Intramuscular Given 06/21/24 0925)                                    Medical Decision Making Risk Prescription drug management.   Pt with gradual onset of back pain suggestive of radiculopathy with prior hx of same.  No neurovascular compromise and no incontinence.  Pt has no infectious sx, hx of CA  or other red flags concerning for pathologic back pain.  Pt is able to ambulate but is painful.  Normal strength and reflexes on exam.  Denies trauma. Will give pt pain control and to return for developement of above sx.      Final diagnoses:  Lumbar radiculopathy, acute    ED Discharge Orders          Ordered    methylPREDNISolone  (MEDROL  DOSEPAK) 4 MG TBPK tablet        06/21/24 0916    cyclobenzaprine  (FLEXERIL ) 10 MG tablet  2 times daily PRN        06/21/24 0916    naproxen  (NAPROSYN ) 500 MG tablet  2 times daily        06/21/24 0916               Doretha Folks, MD 06/21/24 [phone]

## 2024-06-22 ENCOUNTER — Encounter: Payer: Self-pay | Admitting: Nurse Practitioner

## 2024-06-22 ENCOUNTER — Ambulatory Visit: Attending: Nurse Practitioner | Admitting: Nurse Practitioner

## 2024-06-22 VITALS — BP 114/77 | HR 77 | Resp 19 | Ht 65.0 in | Wt 174.6 lb

## 2024-06-22 DIAGNOSIS — M5126 Other intervertebral disc displacement, lumbar region: Secondary | ICD-10-CM | POA: Diagnosis not present

## 2024-06-22 DIAGNOSIS — I10 Essential (primary) hypertension: Secondary | ICD-10-CM

## 2024-06-22 DIAGNOSIS — M5442 Lumbago with sciatica, left side: Secondary | ICD-10-CM

## 2024-06-22 DIAGNOSIS — G8929 Other chronic pain: Secondary | ICD-10-CM

## 2024-06-22 NOTE — Progress Notes (Signed)
 Assessment & Plan:  Jeremiah Curtis was seen today for hypertension.  Diagnoses and all orders for this visit:  Primary hypertension Continue all antihypertensives as prescribed.  Reminded to bring in blood pressure log for follow  up appointment.  RECOMMENDATIONS: DASH/Mediterranean Diets are healthier choices for HTN.    Chronic left-sided low back pain with left-sided sciatica Continue NSAIDs, prednisone  and muscle relaxant as prescribed. Contact orthopedist office to get scheduled for surgical evaluation  Herniated nucleus pulposus, lumbar Continue NSAIDs, prednisone  and muscle relaxant as prescribed. Contact orthopedist office to get scheduled for surgical evaluation   Patient has been counseled on age-appropriate routine health concerns for screening and prevention. These are reviewed and up-to-date. Referrals have been placed accordingly. Immunizations are up-to-date or declined.    Subjective:   Chief Complaint  Patient presents with   Hypertension    Jeremiah Curtis 43 y.o. male presents to office today for follow up to HTN   He has a past medical history of Back symptoms, other, Chronic back pain, High cholesterol, Hypertension, and Idiopathic thrombocytopenic purpura  (HCC).    HTN Blood pressure is well controlled today.  He is currently taking Toprol -XL 50 mg daily and amlodipine  10 mg daily BP Readings from Last 3 Encounters:  06/22/24 114/77  06/21/24 (!) 123/92  05/09/24 124/88      Chronic low back pain Jeremiah Curtis was injured at work a few years ago.  He was attempting to lift a marble countertop with a coworker and the coworker dropped their side of the marble countertop while Mr. Massenburg was attempting to continue to hold onto the countertop to prevent it from falling.  Surgical intervention was recommended however at that time he reports his insurance did not cover this.  He has chronic low back pain with left leg numbness and tingling.   HE  was evaluated in the ED yesterday for lumbar radiculopathy symptoms.  Prescribed NSAIDs, prednisone  and muscle relaxant.  Currently out of work on Monday next week.    Review of Systems  Constitutional:  Negative for fever, malaise/fatigue and weight loss.  HENT: Negative.  Negative for nosebleeds.   Eyes: Negative.  Negative for blurred vision, double vision and photophobia.  Respiratory: Negative.  Negative for cough and shortness of breath.   Cardiovascular: Negative.  Negative for chest pain, palpitations and leg swelling.  Gastrointestinal: Negative.  Negative for heartburn, nausea and vomiting.  Musculoskeletal:  Positive for back pain. Negative for myalgias.  Neurological:  Positive for tingling and sensory change. Negative for dizziness, focal weakness, seizures and headaches.  Psychiatric/Behavioral: Negative.  Negative for suicidal ideas.     Past Medical History:  Diagnosis Date   Back symptoms, other    Chronic back pain    High cholesterol    Hypertension    Idiopathic thrombocytopenic purpura (ITP) (HCC)     Past Surgical History:  Procedure Laterality Date   BACK SURGERY     LEG SURGERY      Family History  Problem Relation Age of Onset   Hypertension Mother    Cancer Mother    Hypertension Father    Diabetes Maternal Grandmother    Diabetes Paternal Grandmother     Social History Reviewed with no changes to be made today.   Outpatient Medications Prior to Visit  Medication Sig Dispense Refill   albuterol  (VENTOLIN  HFA) 108 (90 Base) MCG/ACT inhaler Inhale 2 puffs into the lungs every 6 (six) hours as needed for wheezing or shortness of breath.  18 g 2   amLODipine  (NORVASC ) 10 MG tablet Take 1 tablet (10 mg total) by mouth daily. 90 tablet 1   chlorthalidone  (HYGROTON ) 25 MG tablet Take 1 tablet (25 mg total) by mouth daily. 90 tablet 1   cyclobenzaprine  (FLEXERIL ) 10 MG tablet Take 1 tablet (10 mg total) by mouth 2 (two) times daily as needed for muscle  spasms. 20 tablet 0   EPINEPHrine  0.3 mg/0.3 mL IJ SOAJ injection Inject 0.3 mg into the muscle as needed for anaphylaxis. 2 each 2   ipratropium (ATROVENT ) 0.03 % nasal spray Place 2 sprays into both nostrils every 12 (twelve) hours. 30 mL 12   methylPREDNISolone  (MEDROL  DOSEPAK) 4 MG TBPK tablet Take according to package instructions 21 tablet 0   metoprolol  succinate (TOPROL -XL) 50 MG 24 hr tablet TAKE 1 TABLET (50 MG TOTAL) BY MOUTH DAILY. TAKE WITH OR IMMEDIATELY FOLLOWING A MEAL. 90 tablet 3   naproxen  (NAPROSYN ) 500 MG tablet Take 1 tablet (500 mg total) by mouth 2 (two) times daily. 20 tablet 0   rosuvastatin  (CRESTOR ) 10 MG tablet Take 1 tablet (10 mg total) by mouth daily. 90 tablet 3   famotidine  (PEPCID ) 40 MG tablet Take 1 tablet (40 mg total) by mouth daily. (Patient not taking: Reported on 06/22/2024) 30 tablet 0   pantoprazole  (PROTONIX ) 20 MG tablet Take 1 tablet (20 mg total) by mouth daily. FOR ACID REFLUX (Patient not taking: Reported on 06/22/2024) 90 tablet 1   nitroGLYCERIN  (NITROSTAT ) 0.4 MG SL tablet PLACE 1 TABLET (0.4 MG TOTAL) UNDER THE TONGUE EVERY 5 (FIVE) MINUTES AS NEEDED FOR CHEST PAIN. (Patient not taking: Reported on 06/22/2024) 25 tablet 3   No facility-administered medications prior to visit.    Allergies  Allergen Reactions   Fish Allergy Anaphylaxis   Shellfish Allergy Anaphylaxis   Lisinopril      Blurred vision   Peanut (Diagnostic) Hives   Sesame Seed (Diagnostic) Hives and Itching       Objective:    BP 114/77 (BP Location: Left Arm, Patient Position: Sitting, Cuff Size: Normal)   Pulse 77   Resp 19   Ht 5' 5 (1.651 m)   Wt 174 lb 9.6 oz (79.2 kg)   SpO2 100%   BMI 29.05 kg/m  Wt Readings from Last 3 Encounters:  06/22/24 174 lb 9.6 oz (79.2 kg)  06/21/24 175 lb (79.4 kg)  03/22/24 172 lb 3.2 oz (78.1 kg)    Physical Exam Vitals and nursing note reviewed.  Constitutional:      Appearance: He is well-developed.  HENT:     Head:  Normocephalic and atraumatic.   Cardiovascular:     Rate and Rhythm: Normal rate and regular rhythm.     Heart sounds: Normal heart sounds. No murmur heard.    No friction rub. No gallop.  Pulmonary:     Effort: Pulmonary effort is normal. No tachypnea or respiratory distress.     Breath sounds: Normal breath sounds. No decreased breath sounds, wheezing, rhonchi or rales.  Chest:     Chest wall: No tenderness.  Abdominal:     General: Bowel sounds are normal.     Palpations: Abdomen is soft.   Musculoskeletal:        General: Normal range of motion.     Cervical back: Normal range of motion.   Skin:    General: Skin is warm and dry.   Neurological:     Mental Status: He is alert and oriented to person,  place, and time.     Coordination: Coordination normal.   Psychiatric:        Behavior: Behavior normal. Behavior is cooperative.        Thought Content: Thought content normal.        Judgment: Judgment normal.          Patient has been counseled extensively about nutrition and exercise as well as the importance of adherence with medications and regular follow-up. The patient was given clear instructions to go to ER or return to medical center if symptoms don't improve, worsen or new problems develop. The patient verbalized understanding.   Follow-up: Return in about 6 months (around 12/22/2024).   Haze LELON Servant, FNP-BC [facility] and [facility] [place], [place] [phone]   06/22/2024, 11:19 AM

## 2024-06-28 ENCOUNTER — Other Ambulatory Visit: Payer: Self-pay

## 2024-06-30 ENCOUNTER — Other Ambulatory Visit: Payer: Self-pay

## 2024-08-01 ENCOUNTER — Other Ambulatory Visit: Payer: Self-pay

## 2024-09-01 ENCOUNTER — Other Ambulatory Visit: Payer: Self-pay

## 2024-10-03 ENCOUNTER — Other Ambulatory Visit: Payer: Self-pay

## 2024-10-03 ENCOUNTER — Other Ambulatory Visit: Payer: Self-pay | Admitting: Nurse Practitioner

## 2024-10-03 DIAGNOSIS — R931 Abnormal findings on diagnostic imaging of heart and coronary circulation: Secondary | ICD-10-CM

## 2024-10-03 DIAGNOSIS — I10 Essential (primary) hypertension: Secondary | ICD-10-CM

## 2024-10-03 MED ORDER — METOPROLOL SUCCINATE ER 50 MG PO TB24
50.0000 mg | ORAL_TABLET | Freq: Every day | ORAL | 1 refills | Status: DC
  Filled 2024-10-03: qty 90, 90d supply, fill #0
  Filled 2024-12-25: qty 90, 90d supply, fill #1

## 2024-10-03 MED ORDER — ROSUVASTATIN CALCIUM 10 MG PO TABS
10.0000 mg | ORAL_TABLET | Freq: Every day | ORAL | 1 refills | Status: DC
  Filled 2024-10-03: qty 90, 90d supply, fill #0
  Filled 2024-12-25: qty 90, 90d supply, fill #1

## 2024-10-03 MED ORDER — CHLORTHALIDONE 25 MG PO TABS
25.0000 mg | ORAL_TABLET | Freq: Every day | ORAL | 1 refills | Status: DC
  Filled 2024-10-03: qty 90, 90d supply, fill #0
  Filled 2024-12-25: qty 90, 90d supply, fill #1

## 2024-10-03 MED ORDER — AMLODIPINE BESYLATE 10 MG PO TABS
10.0000 mg | ORAL_TABLET | Freq: Every day | ORAL | 1 refills | Status: DC
  Filled 2024-10-03: qty 90, 90d supply, fill #0
  Filled 2024-12-25: qty 90, 90d supply, fill #1

## 2024-10-04 ENCOUNTER — Other Ambulatory Visit: Payer: Self-pay

## 2024-12-23 ENCOUNTER — Telehealth: Payer: Self-pay | Admitting: Nurse Practitioner

## 2024-12-23 NOTE — Telephone Encounter (Addendum)
 Contacted patient; appointment confirmed for 12/29.

## 2024-12-26 ENCOUNTER — Other Ambulatory Visit: Payer: Self-pay

## 2024-12-26 ENCOUNTER — Ambulatory Visit: Attending: Nurse Practitioner | Admitting: Nurse Practitioner

## 2024-12-26 ENCOUNTER — Encounter: Payer: Self-pay | Admitting: Nurse Practitioner

## 2024-12-26 VITALS — BP 115/75 | HR 84 | Ht 65.0 in | Wt 175.2 lb

## 2024-12-26 DIAGNOSIS — I10 Essential (primary) hypertension: Secondary | ICD-10-CM | POA: Diagnosis not present

## 2024-12-26 DIAGNOSIS — R7989 Other specified abnormal findings of blood chemistry: Secondary | ICD-10-CM

## 2024-12-26 DIAGNOSIS — R7303 Prediabetes: Secondary | ICD-10-CM | POA: Diagnosis not present

## 2024-12-26 DIAGNOSIS — L304 Erythema intertrigo: Secondary | ICD-10-CM

## 2024-12-26 DIAGNOSIS — R931 Abnormal findings on diagnostic imaging of heart and coronary circulation: Secondary | ICD-10-CM | POA: Diagnosis not present

## 2024-12-26 MED ORDER — AMLODIPINE BESYLATE 10 MG PO TABS
10.0000 mg | ORAL_TABLET | Freq: Every day | ORAL | 1 refills | Status: AC
  Filled 2024-12-26: qty 90, 90d supply, fill #0

## 2024-12-26 MED ORDER — CLOTRIMAZOLE-BETAMETHASONE 1-0.05 % EX CREA
1.0000 | TOPICAL_CREAM | Freq: Every day | CUTANEOUS | 1 refills | Status: AC
  Filled 2024-12-26: qty 45, 30d supply, fill #0

## 2024-12-26 MED ORDER — ROSUVASTATIN CALCIUM 10 MG PO TABS
10.0000 mg | ORAL_TABLET | Freq: Every day | ORAL | 1 refills | Status: AC
  Filled 2024-12-26: qty 90, 90d supply, fill #0

## 2024-12-26 MED ORDER — METOPROLOL SUCCINATE ER 50 MG PO TB24
50.0000 mg | ORAL_TABLET | Freq: Every day | ORAL | 1 refills | Status: AC
  Filled 2024-12-26: qty 90, 90d supply, fill #0

## 2024-12-26 MED ORDER — CHLORTHALIDONE 25 MG PO TABS
25.0000 mg | ORAL_TABLET | Freq: Every day | ORAL | 1 refills | Status: AC
  Filled 2024-12-26: qty 90, 90d supply, fill #0

## 2024-12-26 NOTE — Progress Notes (Signed)
 "  Assessment & Plan:  Riven was seen today for hypertension.  Diagnoses and all orders for this visit:  Primary hypertension -     CMP14+EGFR -     amLODipine  (NORVASC ) 10 MG tablet; Take 1 tablet (10 mg total) by mouth daily. -     chlorthalidone  (HYGROTON ) 25 MG tablet; Take 1 tablet (25 mg total) by mouth daily. -     metoprolol  succinate (TOPROL -XL) 50 MG 24 hr tablet; TAKE 1 TABLET (50 MG TOTAL) BY MOUTH DAILY. TAKE WITH OR IMMEDIATELY FOLLOWING A MEAL. Continue all antihypertensives as prescribed.  Reminded to bring in blood pressure log for follow  up appointment.  RECOMMENDATIONS: DASH/Mediterranean Diets are healthier choices for HTN.    Intertrigo Suspect fungal in nature -     clotrimazole -betamethasone  (LOTRISONE ) cream; Apply 1 Application topically daily.  Elevated coronary artery calcium  score -     rosuvastatin  (CRESTOR ) 10 MG tablet; Take 1 tablet (10 mg total) by mouth daily.  Prediabetes -     Hemoglobin A1c  Abnormal CBC -     CBC with Differential/Platelet    Patient has been counseled on age-appropriate routine health concerns for screening and prevention. These are reviewed and up-to-date. Referrals have been placed accordingly. Immunizations are up-to-date or declined.    Subjective:   Chief Complaint  Patient presents with   Hypertension    Jeremiah Curtis 43 y.o. male presents to office today for follow up to HTN  He has a past medical history of Chronic back pain, High cholesterol, Hypertension, and Idiopathic thrombocytopenic purpura (Plt <50 will need to be referrd back to Hem/Onc).    HTN Blood pressure is well controlled. He takes chlorthalidone ,  toprol  XL and amlodipine  daily as prescribed. He does continue to smoke.  BP Readings from Last 3 Encounters:  12/26/24 115/75  06/22/24 114/77  06/21/24 (!) 123/92    Rash: Patient complains of rash involving the bilateral groin. Rash started several weeks ago.Discomfort associated with  rash: is pruritic.  Denies: fever or erythema.  Patient has not had previous evaluation of rash. Patient has tried over the counter eczema cream.  Response to treatment: ineffective. Patient has not had contacts with similar rash. Patient has not identified precipitant. Patient has not had new exposures (soaps, lotions, laundry detergents, foods, medications, plants, insects or animals.)   Prediabetes A1c at goal and controlled with diet.  Lab Results  Component Value Date   HGBA1C 5.9 (H) 03/22/2024    Review of Systems  Constitutional:  Negative for fever, malaise/fatigue and weight loss.  HENT: Negative.  Negative for nosebleeds.   Eyes: Negative.  Negative for blurred vision, double vision and photophobia.  Respiratory: Negative.  Negative for cough and shortness of breath.   Cardiovascular: Negative.  Negative for chest pain, palpitations and leg swelling.  Gastrointestinal: Negative.  Negative for heartburn, nausea and vomiting.  Musculoskeletal: Negative.  Negative for myalgias.  Skin:  Positive for itching and rash.  Neurological: Negative.  Negative for dizziness, focal weakness, seizures and headaches.  Psychiatric/Behavioral: Negative.  Negative for suicidal ideas.     Past Medical History:  Diagnosis Date   Back symptoms, other    Chronic back pain    High cholesterol    Hypertension    Idiopathic thrombocytopenic purpura (ITP) (HCC)     Past Surgical History:  Procedure Laterality Date   BACK SURGERY     LEG SURGERY      Family History  Problem Relation Age of  Onset   Hypertension Mother    Cancer Mother    Hypertension Father    Diabetes Maternal Grandmother    Diabetes Paternal Grandmother     Social History Reviewed with no changes to be made today.   Outpatient Medications Prior to Visit  Medication Sig Dispense Refill   albuterol  (VENTOLIN  HFA) 108 (90 Base) MCG/ACT inhaler Inhale 2 puffs into the lungs every 6 (six) hours as needed for  wheezing or shortness of breath. 18 g 2   EPINEPHrine  0.3 mg/0.3 mL IJ SOAJ injection Inject 0.3 mg into the muscle as needed for anaphylaxis. 2 each 2   amLODipine  (NORVASC ) 10 MG tablet Take 1 tablet (10 mg total) by mouth daily. 90 tablet 1   chlorthalidone  (HYGROTON ) 25 MG tablet Take 1 tablet (25 mg total) by mouth daily. 90 tablet 1   metoprolol  succinate (TOPROL -XL) 50 MG 24 hr tablet TAKE 1 TABLET (50 MG TOTAL) BY MOUTH DAILY. TAKE WITH OR IMMEDIATELY FOLLOWING A MEAL. 90 tablet 1   rosuvastatin  (CRESTOR ) 10 MG tablet Take 1 tablet (10 mg total) by mouth daily. 90 tablet 1   ipratropium (ATROVENT ) 0.03 % nasal spray Place 2 sprays into both nostrils every 12 (twelve) hours. (Patient not taking: Reported on 12/26/2024) 30 mL 12   pantoprazole  (PROTONIX ) 20 MG tablet Take 1 tablet (20 mg total) by mouth daily. FOR ACID REFLUX (Patient not taking: Reported on 12/26/2024) 90 tablet 1   cyclobenzaprine  (FLEXERIL ) 10 MG tablet Take 1 tablet (10 mg total) by mouth 2 (two) times daily as needed for muscle spasms. (Patient not taking: Reported on 12/26/2024) 20 tablet 0   famotidine  (PEPCID ) 40 MG tablet Take 1 tablet (40 mg total) by mouth daily. (Patient not taking: Reported on 12/26/2024) 30 tablet 0   methylPREDNISolone  (MEDROL  DOSEPAK) 4 MG TBPK tablet Take according to package instructions (Patient not taking: Reported on 12/26/2024) 21 tablet 0   naproxen  (NAPROSYN ) 500 MG tablet Take 1 tablet (500 mg total) by mouth 2 (two) times daily. (Patient not taking: Reported on 12/26/2024) 20 tablet 0   No facility-administered medications prior to visit.    Allergies[1]     Objective:    BP 115/75 (BP Location: Left Arm, Patient Position: Sitting, Cuff Size: Normal)   Pulse 84   Ht 5' 5 (1.651 m)   Wt 175 lb 3.2 oz (79.5 kg)   SpO2 99%   BMI 29.15 kg/m  Wt Readings from Last 3 Encounters:  12/26/24 175 lb 3.2 oz (79.5 kg)  06/22/24 174 lb 9.6 oz (79.2 kg)  06/21/24 175 lb  (79.4 kg)    Physical Exam Vitals and nursing note reviewed.  Constitutional:      Appearance: He is well-developed.  HENT:     Head: Normocephalic and atraumatic.  Cardiovascular:     Rate and Rhythm: Normal rate and regular rhythm.     Heart sounds: Normal heart sounds. No murmur heard.    No friction rub. No gallop.  Pulmonary:     Effort: Pulmonary effort is normal. No tachypnea or respiratory distress.     Breath sounds: Normal breath sounds. No decreased breath sounds, wheezing, rhonchi or rales.  Chest:     Chest wall: No tenderness.  Musculoskeletal:        General: Normal range of motion.     Cervical back: Normal range of motion.  Skin:    General: Skin is warm and dry.      Neurological:  Mental Status: He is alert and oriented to person, place, and time.     Coordination: Coordination normal.  Psychiatric:        Behavior: Behavior normal. Behavior is cooperative.        Thought Content: Thought content normal.        Judgment: Judgment normal.          Patient has been counseled extensively about nutrition and exercise as well as the importance of adherence with medications and regular follow-up. The patient was given clear instructions to go to ER or return to medical center if symptoms don't improve, worsen or new problems develop. The patient verbalized understanding.   Follow-up: Return in about 3 months (around 03/31/2025).   Haze LELON Servant, FNP-BC [facility] and [facility] [place], [place] [phone]   12/26/2024, 12:41 PM      [1] Allergies Allergen Reactions   Fish Allergy Anaphylaxis   Shellfish Allergy Anaphylaxis   Lisinopril      Blurred vision   Peanut (Diagnostic) Hives   Sesame Seed (Diagnostic) Hives and Itching  "

## 2024-12-27 ENCOUNTER — Ambulatory Visit: Payer: Self-pay | Admitting: Nurse Practitioner

## 2024-12-27 LAB — CBC WITH DIFFERENTIAL/PLATELET
Basophils Absolute: 0 x10E3/uL (ref 0.0–0.2)
Basos: 0 %
EOS (ABSOLUTE): 0.2 x10E3/uL (ref 0.0–0.4)
Eos: 2 %
Hematocrit: 45.3 % (ref 37.5–51.0)
Hemoglobin: 14.6 g/dL (ref 13.0–17.7)
Immature Grans (Abs): 0 x10E3/uL (ref 0.0–0.1)
Immature Granulocytes: 0 %
Lymphocytes Absolute: 2.7 x10E3/uL (ref 0.7–3.1)
Lymphs: 28 %
MCH: 28.8 pg (ref 26.6–33.0)
MCHC: 32.2 g/dL (ref 31.5–35.7)
MCV: 89 fL (ref 79–97)
Monocytes Absolute: 0.5 x10E3/uL (ref 0.1–0.9)
Monocytes: 5 %
Neutrophils Absolute: 6.3 x10E3/uL (ref 1.4–7.0)
Neutrophils: 65 %
Platelets: 93 x10E3/uL — CL (ref 150–450)
RBC: 5.07 x10E6/uL (ref 4.14–5.80)
RDW: 13.5 % (ref 11.6–15.4)
WBC: 9.8 x10E3/uL (ref 3.4–10.8)

## 2024-12-27 LAB — CMP14+EGFR
ALT: 17 IU/L (ref 0–44)
AST: 17 IU/L (ref 0–40)
Albumin: 4.4 g/dL (ref 4.1–5.1)
Alkaline Phosphatase: 54 IU/L (ref 47–123)
BUN/Creatinine Ratio: 17 (ref 9–20)
BUN: 19 mg/dL (ref 6–24)
Bilirubin Total: 0.4 mg/dL (ref 0.0–1.2)
CO2: 24 mmol/L (ref 20–29)
Calcium: 9.6 mg/dL (ref 8.7–10.2)
Chloride: 100 mmol/L (ref 96–106)
Creatinine, Ser: 1.11 mg/dL (ref 0.76–1.27)
Globulin, Total: 2.7 g/dL (ref 1.5–4.5)
Glucose: 106 mg/dL — ABNORMAL HIGH (ref 70–99)
Potassium: 3.6 mmol/L (ref 3.5–5.2)
Sodium: 140 mmol/L (ref 134–144)
Total Protein: 7.1 g/dL (ref 6.0–8.5)
eGFR: 84 mL/min/1.73

## 2024-12-27 LAB — HEMOGLOBIN A1C
Est. average glucose Bld gHb Est-mCnc: 126 mg/dL
Hgb A1c MFr Bld: 6 % — ABNORMAL HIGH (ref 4.8–5.6)

## 2025-04-04 ENCOUNTER — Ambulatory Visit: Payer: Self-pay | Admitting: Nurse Practitioner
